# Patient Record
Sex: Male | Born: 1995 | Race: White | Hispanic: No | Marital: Single | Smoking: Current some day smoker
Health system: Southern US, Community
[De-identification: ages and names within clinical notes are randomized; demographics above are authoritative.]

## PROBLEM LIST (undated history)

## (undated) DIAGNOSIS — F0630 Mood disorder due to known physiological condition, unspecified: Secondary | ICD-10-CM

## (undated) DIAGNOSIS — F99 Mental disorder, not otherwise specified: Secondary | ICD-10-CM

## (undated) DIAGNOSIS — Z8659 Personal history of other mental and behavioral disorders: Secondary | ICD-10-CM

## (undated) DIAGNOSIS — F32A Depression, unspecified: Secondary | ICD-10-CM

## (undated) DIAGNOSIS — F509 Eating disorder, unspecified: Secondary | ICD-10-CM

## (undated) DIAGNOSIS — F419 Anxiety disorder, unspecified: Secondary | ICD-10-CM

## (undated) DIAGNOSIS — F1994 Other psychoactive substance use, unspecified with psychoactive substance-induced mood disorder: Secondary | ICD-10-CM

## (undated) DIAGNOSIS — F909 Attention-deficit hyperactivity disorder, unspecified type: Secondary | ICD-10-CM

## (undated) DIAGNOSIS — R51 Headache: Secondary | ICD-10-CM

## (undated) DIAGNOSIS — F319 Bipolar disorder, unspecified: Secondary | ICD-10-CM

## (undated) DIAGNOSIS — F17200 Nicotine dependence, unspecified, uncomplicated: Secondary | ICD-10-CM

## (undated) DIAGNOSIS — Z789 Other specified health status: Secondary | ICD-10-CM

## (undated) DIAGNOSIS — F329 Major depressive disorder, single episode, unspecified: Secondary | ICD-10-CM

## (undated) DIAGNOSIS — F209 Schizophrenia, unspecified: Secondary | ICD-10-CM

## (undated) HISTORY — DX: Headache: R51

## (undated) HISTORY — DX: Mental disorder, not otherwise specified: F99

## (undated) HISTORY — PX: APPENDECTOMY: SHX54

## (undated) NOTE — ED Notes (Signed)
 Formatting of this note might be different from the original. Pt increased anxiety and restless in stretcher. RN to give PRN versed . Will continue to monitor Electronically signed by Rafael Parody, RN at 05/27/2020  6:08 AM EDT

## (undated) NOTE — Unmapped External Note (Signed)
 Formatting of this note might be different from the original. Pt presents with methamphetamine overdose.   BP (!) 138/92   Pulse 137   Temp 37.4 C (99.4 F) (Oral)   Resp 19   Wt 61.2 kg (135 lb)   SpO2 99%   BMI 21.79 kg/m   I have seen and evaluated the patient along with the resident physician. I have obtained an independent history and performed an independent physical exam. I agree with the residents documentation.   Almarie KIDD. Bartholome, MD  Electronically signed by Bartholome Almarie KIDD., MD at 05/27/2020  4:09 AM EDT

## (undated) NOTE — ED Notes (Signed)
 Formatting of this note might be different from the original. Patient is currently anxious and tachycardic at this time. MD contacted to ask about fluids on this patient.  Electronically signed by Cher Mora, RN at 05/27/2020  8:17 AM EDT

## (undated) NOTE — ED Notes (Signed)
 Formatting of this note might be different from the original. Paged team, pt wants to leave AMA.  Electronically signed by Coleridge Domino, RN at 05/27/2020  8:59 PM EDT

## (undated) NOTE — ED Notes (Signed)
 Formatting of this note might be different from the original. 1013 signed by MD Jama Mark at bedside, green armband placed on pt. Pt in hallway spot w/in close vision to nurses station Electronically signed by Rafael Parody, RN at 05/27/2020  5:07 AM EDT

## (undated) NOTE — ED Notes (Signed)
 Formatting of this note might be different from the original. Patient denies pain and is resting comfortably.  Electronically signed by Garnett Smolder, RN at 05/27/2020  8:31 AM EDT

## (undated) NOTE — Discharge Summary (Signed)
 Formatting of this note is different from the original. MEDICAL AMA SUMMARY  NAME: Thomas [name] MRN: [id number] DOB: 1996/07/29 ADMIT Date: 05/27/2020  3:37 AM  AMA Date: 05/27/2020 10:00 PM  Attending of Record: No att. providers found Admitting Physician: Aureliano DEL. Essie, MD Discharge Physician: Barnetta Greener, MD, Aureliano DEL. Essie, MD Outpatient PCP: No primary care provider on file.  Past Medical History:  Diagnosis Date  ? ADHD   ? Bipolar 1 disorder (CMS-HCC)   ? Schizophrenia (CMS-HCC)   ? Schizophrenia (CMS-HCC)    Principal and Secondary Diagnoses: 1. Amphetamine  Intoxication 2. Polysubstance Abuse Disorder 3. Schizophrenia 4. Bilateral Palmar Pustural Lesion .   Diagnostic Tests: UDS - positive for amphetamines Blood Cx: NGTD x 2  Procedures:1. None  Brief Presenting History: Thomas [name] is a 41 y.o. male with a PMH of schizoaffective disorder, bipolar disorder, prior SI attempt with acetaminophen , borderline personality disorder, hep C, and drug abuse who presented to the ED with drug intoxication.   Patient is a very difficult historian. He denied any drug abuse. Said he would like to get out of [place] . He reported eating a sandwich that tasted very bad. Notably patient was vomiting on arrival to the ED.  Per ED signout, patient reportedly stole delson and drank it, also took crystal meth. The ED contacted poison control who recommended fluids, benzos, and monitoring for 12 hours.   Hospital Course by Problem List:  #Amphetamine  Intoxication #Polysubstance Abuse Disorder Patient with tachycardia, aggitation, dilated pupils, and +UDS consistent with amphetamine  intoxication. Patient given IVF and watched throughout the day. Patient's intoxication improved and the patient eventually elected to leave against medical advice as he had yet to complete an infectious work up for his skin lesions. Patient was able to repeat risks and benefits of leaving hospital and  getting treatment, respectively, and expressed clear understanding of risks of forgoing treatment. Patient left AMA after getting blood culture results. Psych consulted and patient was contemplative regarding substance abuse and is interested in going to rehab In the future.   #Schizoaffective Disorder #SI attempted Hx Patient did not express SI/HI. Psych consulted and recommended zyprexa 10 mg every day for psychosis with 2.5 mg q6hr PRN for anxiety. Patient left AMA prior to receiving medications. As above, patient understood risks of leaving, had capacity, and left the hospital despite being told of the risks of doing so.   #Bilateral Palmar Pustular Skin Lesions Patient with bilateral palmar pustual skin lesions on arrival. Unclear etiology and patient left AMA prior to full work up being doing. Was able to get blood cultures prior to patient leaving, which were negative. Patient did not stay to get other infectious work up. As above, left AMA despite being told of risks of forgoing treatment and demonstrating understanding of that risk.   The remainder of the patient's medical problems were chronic and stable without any further intervention this admission. The patient will continue the current treatments and medications.  Discharge Medication Reconcilitation: Discharge Medication List as of 05/27/2020 10:02 PM   CONTINUE these medications which have CHANGED   Details  risperiDONE  (RISPERDAL ) 2 MG/2 ML SOLN Take 1 mL (1 mg total) by mouth 2 times every day.Disp-60 mL, R-0, Normal     Discharge Instructions: The patient was educated on warning signs regarding the current medical conditions. If any of these issues were to arise or worsen, the patient was instructed to contact the PCP or seek further medical evaluation in the emergency room.  Test Pending/Items  to Follow Up: None  Follow-up Appointment Date and Time: No future appointments.  Barnetta Greener, MD [facility] Internal Medicine,  PGY-3 PIC [phone] Cosigned by [name], MD at 06/16/2020  4:09 AM EDT Electronically signed by Greener Barnetta, MD at 06/15/2020  1:45 PM EDT Electronically signed by [name], MD at 06/16/2020  4:09 AM EDT  Associated attestation - Essie Aureliano Barrio, MD - 06/16/2020  4:09 AM EDT Formatting of this note might be different from the original. I am administratively signing this document. AURELIANO HILARIO ESSIE, MD [facility] [phone]

## (undated) NOTE — ED Notes (Signed)
 Formatting of this note might be different from the original. Pt requesting to be discharged at this time. RN called Dr. Damita and per provider, pt is no longer a risk for injury at this time. Pt requesting to leave AMA. Per Dr. Damita, pt able to be discharged at this time. 1013 to be discontinued at this time.  Electronically signed by Fritz Dural, RN at 05/27/2020  9:09 PM EDT Electronically signed by Fritz Dural, RN at 05/27/2020  9:26 PM EDT

## (undated) NOTE — Progress Notes (Signed)
 Formatting of this note is different from the original.  Internal Medicine Progress Note  Service: [facility] Medicine Team [facility] Day: LOS: 0 days  Attending:  Aureliano Leys, MD  Subjective / Interval History  Pt is poor historian.   Pt reports auditory hallucinations - hearing the TV and people outside his room talk about him. Also reports that he can't pee although he voided this morning.   Otherwise no pain, SOB, fever, chills, N/V. No SI/HI  Objective  Physical Exam (5) 24hour vital signs: Temp:  [37.1 C (98.8 F)-37.6 C (99.6 F)] 37.6 C (99.6 F) Pulse:  [100-137] 108 Resp:  [16-20] 18 Blood Pressure: (137-155)/(80-92) 146/80  @LASTSAO2 (1)@ No intake/output data recorded.  GEN: NAD, AAOx3,  HEENT: no icterus, dilated pupils, EOMI Neuro: Alert, follows commands CVS: Tachycardic, no m/r/g Pulm: CTAB, normal WOB on room air Abd: Soft, NT, ND Ext: track marks and excoriation on arms Skin: scattered bilateral palmar 1-2 mm pustules, diffuse excoriations over back  Psych: fidgets, anxious, looking around  Labs reviewed, notable for  BMP:   CBC and Coags:   Recent Labs    05/27/20 0436  NA 137  K 3.4  CL 102  CO2 22  BUN 20  CREATININE 1.0  GLU 71   Recent Labs    05/27/20 0436  WBC 17.9*  HGB 15.1  HCT 42.9  PLT 240   Recent Labs    05/27/20 0436  CALCIUM 9.8  BILITOT 1.7*  BILIDIR 0.5  ALKPHOS 69  AST 50*  ALT 21  PROT 7.9  LABALB 4.8   Recent Labs    05/27/20 0436  PT 13.2*  INR 1.2*   Recent Labs    05/27/20 0436  MCV 87.0    Recent Labs    05/27/20 0436  CKTOTAL 1,968*   No results found for: CHOLTOT, TRIG, HDL, LDLCALC No results found for: TSH, HGBA1C, MICROALBUR urine Lab Results  Component Value Date   COLORU Yellow 01/07/2020   CLARITYU Clear 01/07/2020   SPECGRAV 1.027 01/07/2020   PROTEINU 1+=30 mg/dL (A) 96/75/7978   GLUCOSEU Negative < 50 mg/dL 96/75/7978   KETONESU Trace=5 mg/dL (A) 96/75/7978    BILIRUBINUR Negative < 2.0 mg/dL 96/75/7978   HGBQUALU Negative <0.03 mg/dL 96/75/7978   NITRITE Negative 01/07/2020   LEUKESTRASEU Negative <25 WBC/uL 01/07/2020   LEUKOCYTESUR 11 - 25 / HPF (A) 01/07/2020   RBCU < 3 / HPF 01/07/2020   BACTERIAUA Present (A) 01/07/2020   UDS: Positive Amphetamine , Benzos, Fentanyl ; Negative Barbs, THC, cocaine, opiates; Low EtOH, acetaminophen , and salicylates  Imaging  EKG: Sinus tachycardia  ST-Sinus tachycardia-rate>99  Probable left atrial enlargement  PLAE-Probable left atrial enlargement-P >8mS, <-0.31mV V1  Borderline T abnormalities, anterior leads  T0AN-Borderline T abnormalities, anterior leads-T flat or neg, V2-V4  Medications Scheduled Meds: ? Enoxaprin [place] inj for VTE Prophylaxis (HIGH risk for VTE)  40 mg Subcutaneous Q24H  ? lactated ringers  1,000 mL Intravenous Once  ? OLANZapine ODT zydis  10 mg Sublingual Nightly   PRN Meds:midazolam , [COMPLETED] Insert Peripheral IV **AND** Maintain Peripheral IV **AND** normal saline **AND** sodium chloride , sennosides, polyethylene glycol  Assessment:  Thomas [name] is a 18 y.o. male with a PMH of schizoaffective disorder, bipolar disorder, prior SI attempt with acetaminophen , borderline personality disorder, hep C, and drug abuse who presented to the ED with drug intoxication and vomiting. Pt admitted to drug use of crystal meth. Unlikely this was a suicide attempt.   Problem List  and Plan:  1.  #Amphetamine  and Fentanyl  Intoxication w/Hx of Suicide Attempts, Schizoaffective & Bipolar Disorder & Borderline personality Disorder Above drugs positive on UDS. Per ED signout, they discussed with toxicology who recommended fluids, PRN benzos, and monitoring for 12 hours. S/p 2L IV fluids in ED.  - inpatient psychiatry consult - they rec Zyprexa 10 mg daily and 2.5 mg q6h prn - supportive care - 1000 mL LR - Pt states that he wants to leave AMA because he has things to handle before the end of  the week  2. Leukocytosis (17.9) - likely related to meth use - continue supportive care  3. Elevated CPK (1968) - possibly has some rhabdomyolysis from meth use - continue supportive care  4. Bilateral Palmar Pustular lesions - scabies v syphilis v infx. No lesions in web space - scabies lower on DDx - RPR ordered - blood cultures drawn  General:  - Diet/education: regular  - DVT Prophylaxis: Lovenox  40 mg   - Functional Status/Consults: Psych inpatient  - Lines/Catheters currently in place: PIV  - Code Status: Full  Montgomery Griffiths, MD [facility] Internal Medicine PGY-1 05/27/2020 4:12 PM   Cosigned by [name], MD at 05/27/2020 10:23 PM EDT Electronically signed by Griffiths Montgomery, MD at 05/27/2020  4:46 PM EDT Electronically signed by Griffiths Montgomery, MD at 05/27/2020  7:17 PM EDT Electronically signed by [name], MD at 05/27/2020 10:23 PM EDT  Associated attestation - Essie Aureliano Barrio, MD - 05/27/2020 10:23 PM EDT Formatting of this note might be different from the original. I am administratively signing this document. AURELIANO HILARIO ESSIE, MD [place] [phone]

## (undated) NOTE — ED Notes (Signed)
 Formatting of this note might be different from the original. Patient got out of his bed with sitter at the bedside and ran out into the waiting room and tried to escape. Patient was able to be stopped and security was called. Patient was taken to [facility] and report was given to the nurse on the patient and 10-13 was given as well. Patient was already dressed out and belongings were in a bag. Patient was given a debriefing after the situation on why he is not able to leave and that he is in safe place. Security escorted the patient back to PES with nurse.  Electronically signed by Cher Mora, RN at 05/27/2020  9:24 AM EDT

## (undated) NOTE — H&P (Signed)
 Formatting of this note is different from the original. Internal Medicine History and Physical  Service: [facility] Medicine Team H   Attending: Aureliano DEL. Essie, MD  Name: Thomas [name] MRN:   [id number]  CC: drug overdose  HPI Thomas [name] is a 25 y.o. male with a PMH of schizoaffective disorder, bipolar disorder, prior SI attempt with acetaminophen , borderline personality disorder, hep C, and drug abuse who presented to the ED with drug intoxication.   Patient is a very difficult historian. He denies any drug abuse to me. Says he would like to get out of [place] . He reports eating a sandwich that tasted very bad. Notably patient was vomiting on arrival to the ED.  Per ED signout, patient reportedly stole delson and drank it, also took crystal meth. The ED contacted poison control who recommended fluids, benzos, and monitoring for 12 hours. Unclear if yesterday's drug abuse was a suicide attempt, but psychiatry was not consulted in the ED.  Review of Systems Limited ability to assess based on patient's affect/physical state but denies chest pain, fevers, SOB, or pain anywhere else.  ED Course: Received 2L LR and 6mg  versed .  PMH Past Medical History:  Diagnosis Date  ? ADHD   ? Bipolar 1 disorder (CMS-HCC)   ? Schizophrenia (CMS-HCC)   ? Schizophrenia (CMS-HCC)    PSxH No past surgical history on file.  Home medications No current facility-administered medications on file prior to encounter.   Current Outpatient Medications on File Prior to Encounter  Medication Sig Dispense Refill  ? risperiDONE  (RISPERDAL ) 2 MG/2 ML SOLN Take 1 mL (1 mg total) by mouth 2 times every day. 60 mL 0   Allergies Allergies  Allergen Reactions  ? Olanzapine Nausea Only and Other (See Comments)    Makes me red    FmHx Unable to assess  SoHx Unable to assess  PCP: No primary care provider on file.  Physical Exam BP (!) 155/80   Pulse 122   Temp 37.1 C (98.8 F) (Oral)   Resp 16    Wt 61.2 kg (135 lb)   SpO2 99%   BMI 21.79 kg/m  Weight: 61.2 kg (135 lb)  GEN:  Sitting in no acute distress, follows directions but not well HEENT: No icterus, dilated pupils that are minimally responsive to light, diffuse facial erythema NEURO: alert CV:  Tachycardic, no m/r/g PULM:   CTAB, normal work of breathing on room air ABD:  Nondistended, erythematous navel EXTREM: atraumatic SKIN:  Scattered <1 - 2mm pustules on palms of bilateral hands, linear excoriation over right flank, diffuse excoriations over entire back PSYCH: Fidgety, looking around  Labs/Studies  CMP:  Lab Results  Component Value Date   NA 137 05/27/2020   K 3.4 05/27/2020   CL 102 05/27/2020   CO2 22 05/27/2020   BUN 20 05/27/2020   CREATININE 1.0 05/27/2020   CREATININE 0.8 03/19/2020   CREATININE 0.9 03/19/2020   GLU 71 05/27/2020   PROT 7.9 05/27/2020   LABALB 4.8 05/27/2020   BILITOT 1.7 (H) 05/27/2020   BILIDIR 0.5 05/27/2020   ALT 21 05/27/2020   AST 50 (H) 05/27/2020   ALKPHOS 69 05/27/2020   CBC and Coags:  Lab Results  Component Value Date   WBC 17.9 (H) 05/27/2020   HGB 15.1 05/27/2020   HCT 42.9 05/27/2020   MCV 87.0 05/27/2020   PLT 240 05/27/2020   PT 13.2 (H) 05/27/2020   INR 1.2 (H) 05/27/2020   Lipids: No results found for:  CHOLTOT, TRIG, HDL, LDLCALC Cardiac Enzymes:  Lab Results  Component Value Date   TROPONINI <0.03 03/05/2019   TROPONINI <0.03 02/17/2019   CKTOTAL 1,968 (H) 05/27/2020   CKTOTAL 43 (L) 03/18/2020   Other Labs: No results found for: TSH, HGBA1C, MICROALBUR  Urine: Lab Results  Component Value Date   COLORU Yellow 01/07/2020   CLARITYU Clear 01/07/2020   SPECGRAV 1.027 01/07/2020   PROTEINU 1+=30 mg/dL (A) 96/75/7978   GLUCOSEU Negative < 50 mg/dL 96/75/7978   KETONESU Trace=5 mg/dL (A) 96/75/7978   BILIRUBINUR Negative < 2.0 mg/dL 96/75/7978   HGBQUALU Negative <0.03 mg/dL 96/75/7978   NITRITE Negative 01/07/2020   LEUKESTRASEU  Negative <25 WBC/uL 01/07/2020   LEUKOCYTESUR 11 - 25 / HPF (A) 01/07/2020   RBCU < 3 / HPF 01/07/2020   BACTERIAUA Present (A) 01/07/2020   UDS: positive for amphetamine , fentanyl , and benzos  EKG: LAE, q in aVL, inverted T wave in v1  Assessment/Plan: Thomas [name] is a 56 y.o. male with a PMH of schizoaffective disorder, bipolar disorder, prior SI attempt with acetaminophen , borderline personality disorder, hep C, and drug abuse who presented to the ED with drug intoxication. Unclear if this was also a suicide attempt.   #Amphetamine  and Fentanyl  Intoxication w/Hx of Suicide Attempts, Schizoaffective & Bipolar Disorder & Borderline personality Disorder Above drugs positive on UDS. Per ED signout, they discussed with toxicology who recommended fluids, PRN benzos, and monitoring for 12 hours. S/p 2L IV fluids in ED.  - inpatient consult to psychiatry - supportive care - patient interested in getting a bus ticket to [place]   General - Diet: regular - DVT Prophylaxis: lovenox  - Line/Foley Status: - Contact: Primary Emergency Contact: Marzouk, Aura - Code Status: - Dispo:   DISCHARGE PLANNING: Meds: TBD O2: TBD Home: TBD ABX: TBD Walk: TBD Kar: TBD Sched:   No future appointments.  Lucila Hem, M.D. PGY3, [facility] Internal Medicine [facility] [phone] 05/27/2020  The 8PM until 7AM cross cover resident for this patient is PIC ([id number] if Teams A-H, [id number] if Teams I-L or SIS).  Cosigned by [name], MD at 05/27/2020 10:25 PM EDT Electronically signed by Hem Lucila, MD at 05/27/2020  8:02 AM EDT Electronically signed by [name], MD at 05/27/2020 10:25 PM EDT  Associated attestation - Essie Aureliano Barrio, MD - 05/27/2020 10:25 PM EDT Formatting of this note is different from the original. I saw and evaluated the patient.  Discussed with resident and agree with resident?s findings and plan as documented in the resident?s note. The patient presents with evidence of  sympathomimetic intoxication. His UDS was positive for amphetamines, fentanyl  and cannabinoids. He expresses no SI or HI and does not require psychiatric hold. On exam he is unkempt and has some small pustules on his hands and a fine erythematous rash on his feet.   The etiology of the rash is unclear but it appears most c/w folliculitis. Given the patients leukocytosis and h/o IVDA (states he hasn't shot drugs in months) will draw blood cxs.   The patient wishes to leave AMA. He understands that we would like to keep him overnight for further observation and understands the risk of his leaving. He will allows us  to draw blood cxs and we have a way to contact him should they return positive. He expresses that he will return to the hospital if necessary. Will attempt to make sure patient has scripts for his psychiatric meds. Patient to sign AMA form.  AURELIANO HILARIO ESSIE, MD,  [facility] Professor of Medicine pic [phone]

## (undated) NOTE — ED Notes (Signed)
 Formatting of this note might be different from the original. 1L of fluid started on this patient for HR of 128  Electronically signed by Cher Mora, RN at 05/27/2020  8:47 AM EDT

## (undated) NOTE — ED Notes (Signed)
 Formatting of this note might be different from the original. Dustin MD resident came and spoke with pt and verbalized it being ok for him leaving AMA. Pt ambulatory w/ steady gait. Pt verbalized understanding of being discharged. Pt refused discharge vitals. Pt walked out of ECC w/ RN and security. Electronically signed by Coleridge Domino, RN at 05/27/2020  9:59 PM EDT Electronically signed by Coleridge Domino, RN at 05/27/2020 10:01 PM EDT

## (undated) NOTE — ED Notes (Signed)
 Formatting of this note might be different from the original. Spoke w/ Dustin MD with [employer] for [employer]. Celestine stated he would discuss w/ his senior resident about AMA status and be down to talk to the pt if this was an option. Dustin also stated that they do not sign AMA forms, they would be putting the conversation with the pt in his chart for record.  Electronically signed by Coleridge Domino, RN at 05/27/2020  9:07 PM EDT

## (undated) NOTE — Consults (Signed)
 Associated Order(s): [facility] IP CONSULT TO PSYCHIATRY Formatting of this note is different from the original. PSYCHIATRIC CONSULTATION  Patient ID: [id number]  Name: Thomas [name] Admission Date: 05/27/2020  3:37 AM  Sex: male  MRN: [id number] Attending Provider: [name], MD  Room/Bed: PES62/PES62 DOB: 01-Jun-1996 Age: 29 y.o.    Reason for Consult: SI  Referring Physician Contact Name and Number: Thomas Ice MD  ASSESSMENT:   Patient is a 45 y/o Caucasian male with PMH of Hepatitis C and past psychiatric hx of SIMD vs schizoaffective disorder, polysubstance use and borderline personality disorder who presented to the ED with drug intoxication. U tox was positive for amphetamines, benzos and fentanyl . Psychiatry was consulted for SI assessment. On assessment patient is AAOx3. Reports long hx of polysubstance use, that started at the age of 79. Reports using Dextromethorphan and [name] yesterday, uses those all day, everyday. Reports AH/VH, and delusions. Currently denies acute mood, physical or cognitive issues. Denies feeling suicidal, denies intent or plan. Denies HI, intent or plan. On assessment appears restless, paranoid and hyperverbal. Those symptoms are likely in the context of chronic polysubstance use, although an underlying psychotic disorder cannot be excluded. He is at chronic suicide risk given past history of suicide attempt. Reports his mother and grandmother as protective factors. Currently contemplative regarding his substance use and interested in going to rehab once medically cleared.   Psychiatric/Substance: SIMD Polysubstance use (crystal meth, cocaine, benzodiazepines, ectasy, opiates, dextromethorphan,) Schizoaffective disorder  Schizophrenia by hx Borderline Personality Disorder  Medical: Amphetamine  and Fentanyl  intoxication  Psychosocial: Inadequate housing Low income Problems related to primary support group Academic or educational problem Problem  related to employment Problem related to legal circumstances Nonadherence to medical and/or mental health treatment   RECOMMENDATIONS / PLAN:  - Zyprexa Zydis 10 mg for psychosis and paranoia - Zyprexa 2.5 mg Q 6 hours prn for paranoia, psychosis and agitation ( Do not exceed Zyprexa 20 mg in the 24 hour period) - Will try to get collateral from mom - We will continue to follow and re-evaluate tomorrow for final dispo  Please page Psychiatry Consult Team at [facility] [phone] if you have further questions on business days between 8:00am and 4:30pm In case of a psychiatric emergency arising after hours, on a weekend or holiday, please page Psychiatry Emergency Service at [facility] [phone].  HISTORY OF PRESENT PSYCHIATRIC ILLNESS: For a more detailed HPI please refer to medical student's Thomas [name]'s note  Thomas [name] is a 43 y.o. male with a PMH of schizoaffective disorder, bipolar disorder, prior SI attempt with acetaminophen , borderline personality disorder, hep C, and drug abuse who presented to the ED with drug intoxication.  Psychiatry was consulted for Suicide risk assessment. Patient reports long history of polysubstance use that started with cannabis at the age of 75. Reports using IV heroin, methamphetamines, amphetamines, Ectasy, PO opiates, and dextromethorphan. Denies Etoh use. Reports he uses Dextromethorphan and [name] everyday. Report he has been in rehab twice in his life - last time in 05-27-17 Reports father had a hx of polysubstance use and died due to complications in May 28, 2019. Patient was not close with him. 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 Denies hx of seizures, LOC, TBI Reports he ate food prior to coming to the hospital and was vomiting : due to food smelling  like death Reports seeing demons and believes people are out to get him and TV is talking  to him. Denies SI/HI, denies intent or plan  Per EMR: 10  ER presentations, mostly in the context of substance use since January 2021 Per care everywhere patient has been utilizing multiple healthcare systems in [place] , [place] and     PSYCHIATRIC ROS Depression: Yes Depressed mood Mania: Yes Pressured speech Psychosis: Yes AH/VH,  Anxiety/ OCD/ PTSD: No Suicidality: Yes intermittent SI with no intent or plan Other Self-Injurious Behavior: No Violent/ Aggressive Behavior: No   PAST PSYCHIATRIC HISTORY Past Diagnoses: Schizophrenia, polysubstance use Past Hospitalizations: Reports several past psychiatric hospitalizations Psychiatrist: none Therapist: none Prior Psychiatric Drug Trials: Risperidone  2 mg, Busbar 5 mg, Wellbutrin  XL 150 mg , Invega  Sustenna, Seroquel  400 mg QHS Suicide Attempt: reports SA via overdose  Self-injurious behavior: none  FAMILY HISTORY:  Father with hx of polysubstance use, died 2/2 complications of substance use in 2020   Completed suicide in relatives: Unknown   SOCIAL HISTORY: In [place] for the past 6 months Originally from [place]  Currently unemployed and homeless, reports he last worked prior to his father passing away Mother, Thomas [name] and siblings live in [place]  Finished 10th grade  Social History   Socioeconomic History  ? Marital status: Single    Spouse name: Not on file  ? Number of children: Not on file  ? Years of education: Not on file  ? Highest education level: Not on file  Occupational History  ? Not on file  Tobacco Use  ? Smoking status: Current Every Day Smoker    Packs/day: 0.25  ? Smokeless tobacco: Never Used  Substance and Sexual Activity  ? Alcohol use: Yes  ? Drug use: Yes    Types: Marijuana, Methamphetamines, Cocaine    Comment: cough syrup  ? Sexual activity: Not Currently  Other Topics Concern  ? Not on file  Social History Narrative   ** Merged History Encounter **    Social Determinants of Health   Financial Resource Strain:   ?  Difficulty of Paying Living Expenses:   Food Insecurity:   ? Worried About Programme researcher, broadcasting/film/video in the Last Year:   ? Barista in the Last Year:   Transportation Needs:   ? Freight forwarder (Medical):   ? Lack of Transportation (Non-Medical):   Intimate Partner Violence:   ? Fear of Current or Ex-Partner:   ? Emotionally Abused:   ? Physically Abused:   ? Sexually Abused:     ALLERGIES:  Allergies  Allergen Reactions  ? Olanzapine Nausea Only and Other (See Comments)    Makes me red    PAST MEDICAL HISTORY:  Past Medical History:  Diagnosis Date  ? ADHD   ? Bipolar 1 disorder (CMS-HCC)   ? Schizophrenia (CMS-HCC)   ? Schizophrenia (CMS-HCC)     REVIEW OF SYSTEMS: Reviewed GEN: Sitting in no acute distress, follows directions but not well HEENT: No icterus, dilated pupils that are minimally responsive to light, diffuse facial erythema NEURO: Alert RC:Ujrybrjmipr, no m/r/g PULM:CTAB, normal work of breathing on room air ABD: Nondistended, erythematous navel EXTREM: Atraumatic SKIN: Scattered <1 - 2mm pustules on palms of bilateral hands, linear excoriation over right flank, diffuse excoriations over entire back PSYCH: Restless, paranoid  ROS completed on 05/27/20 reviewed and updated.  Pertinent new positives and negatives included in current history of present illness  MEDICATIONS PRIOR TO ADMISSION: (Not in a hospital admission)   CURRENT MEDICATIONS: Current Facility-Administered Medications  Medication Dose Route Frequency Provider Last Rate Last Admin  ?  midazolam  (VERSED ) 2 MG/2ML injection 2 mg  2 mg Intravenous PRN Sadjadi, Raha, MD   2 mg at 05/27/20 0605  ? normal saline 0.9 % flush 3 mL  3 mL Intravenous PRN Sadjadi, Raha, MD       And  ? sodium chloride  0.9 % IV fluid 500 mL  500 mL Intravenous PRN Sadjadi, Raha, MD      ? sennosides (SENNA-GEN) 8.6 MG tablet 2 tablet  2 tablet Oral Nightly PRN Sadjadi, Raha, MD      ? polyethylene glycol  (MIRALAX) packet 17 g  17 g Oral Daily PRN Sadjadi, Raha, MD      ? enoxaparin  (LOVENOX ) 40 mg/0.4 mL injection 40 mg  40 mg Subcutaneous Q24H Sadjadi, Raha, MD       Current Outpatient Medications  Medication Sig Dispense Refill  ? risperiDONE  (RISPERDAL ) 2 MG/2 ML SOLN Take 1 mL (1 mg total) by mouth 2 times every day. 60 mL 0    LABORATORY STUDIES:  U tox positive for Benzodiazepines, Amphetamines and Fentanyl  CPK 1,968 WBC 17.9 AST:50  Reviewed    VITAL SIGNS:   BP 137/85   Pulse 100   Temp 37.6 C (99.6 F) (Oral)   Resp 20   Wt 61.2 kg (135 lb)   SpO2 99%   BMI 21.79 kg/m      MENTAL STATUS EXAMINATION CONSCIOUSNESS: Oriented x 4 APPEARANCE: Disheveled, multiple abrasions on upper extremities and back, pustules on palms bilaterally BEHAVIOR: Cooperative MOTOR ACTIVITY: Restless, psychomotor agitated SPEECH: Pressured and Hyperverbal MOOD: Patient states mood is IPeople are talking about me, I don't feel safe here AFFECT:  Tense ASSOCIATIONS:  logical THOUGHT PROCESS: Goal directed, Tangential and disorganized at times THOUGHT CONTENT: Delusions and Paranoia  SUICIDAL IDEATION:  Yes, without intention (See Suicide Risk Assessment) HOMICIDAL IDEATION:  Denies INSIGHT: Adequate JUDGMENT: Fair COGNITIVE FUNCTION: Grossly Intact   ADDITIONAL COMMENTS:   Patient seen & staffed with Dr.Schwartz Electronically signed by: Gwenda CANDIE Lone, MD   Cosigned by Arlana Jenkins BROCKS., MD at 05/28/2020  5:56 PM EDT Electronically signed by Lone Gwenda, MD at 05/27/2020 12:44 PM EDT Electronically signed by Lone Gwenda, MD at 05/27/2020  1:32 PM EDT Electronically signed by Lone Gwenda, MD at 05/27/2020  1:42 PM EDT Electronically signed by Lone Gwenda, MD at 05/27/2020  2:27 PM EDT Electronically signed by Arlana Jenkins BROCKS., MD at 05/28/2020  5:56 PM EDT  Associated attestation - Arlana Jenkins Craven, MD - 05/28/2020  5:56 PM EDT Formatting of  this note might be different from the original. Attending Note:  I have reviewed the history and have personally evaluated the patient. I have contributed to the evaluation and planning regarding this patient, and am in agreement with the documentation.   Jenkins Arlana, MD
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
## (undated) NOTE — ED Notes (Signed)
 Formatting of this note might be different from the original. Patient in room, requests to leave. Appears agitated, has dilated pupils.  Electronically signed by Gwyneth Blunt, RN at 05/27/2020  5:12 PM EDT

## (undated) NOTE — Unmapped External Note (Signed)
 Formatting of this note might be different from the original. Called by RN that pt was again requesting to leave AMA but there was a 1013 in the chart. I spoke with senior resident of the primary team, Dr. Horace who confirmed that patient had been cleared by the attending during the day and posed no further risk of intentional self-harm.   Pt seen at bedside, found initially to be somnolent but eventually awoke with brisk stimulation.   Thomas [name] is oriented to person, place, year, and president. During our conversation he is able to hold sustained attention to me. He denies SI, HI, or plans for these things when asked. I have no intention of hurting myself ever in 1,000 million years.   The patient has proved to this physician that they have capacity to make their own medical decisions. The patient understands the the risk of leaving AMA may include worsening health, up to and including death. The patient voiced understanding and had no further questions.   Thomas CORDOBA Juline, MD MS Resident Physician PGY-3 [facility] Medicine [facility] [phone]   Electronically signed by [name] Thomas Gearing, MD at 05/27/2020  9:31 PM EDT

## (undated) NOTE — Procedures (Signed)
 Associated Order(s): SUICIDE RISK ASSESSMENT Post-Procedure Diagnose(s): Substance induced mood disorder (CMS-HCC) Formatting of this note might be different from the original. [place] Suicide Risk Assessment and other Violent Risk Assessment  Type of Assessment: Initial Assessment Information was gathered from: Patient  Suicidal Thoughts in the Last Month and Lifetime Passive thoughts: Have you wished you were dead?: Last month: Yes  Lifetime: Yes Active Suicidal ideation:   Ideation: Have you had thoughts of killing yourself?: Last month: Yes Lifetime: Yes   Method: Have you been thinking about how you would kill yourself?: Last month: NoLifetime: Yes   Intent: Have you had these thoughts AND intention of acting on them?: Last month: NoLifetime: Yes   Plan:  Have you started to work on the details of how to kill yourself?: Last month: NoLifetime: Yes      Frequency of thoughts in the last week: Less than once a week  Intensity of Ideation  Recent - Most severe Ideation: 1 Description of ideation: Lifetime - Most severe Ideation: 1 Description of ideation:  Frequency How many times have you had these thoughts?  Last Month: Less than once a week   Lifetime: Less than once a week Duration When you have the thoughts how long do they last? Last Month: Fleeting - few seconds or minutes Lifetime: Fleeting - few seconds or minutes Controllability  Could/can you stop thinking about killing yourself or wanting to die if you want to? Last Month: Can control thoughts with little difficulty Lifetime: Can control thoughts with little difficulty Deterrents Are there things - anyone or anything - that stopped you from wanting to die or acting on thoughts of committing suicide? Last Month: Deterrents definitely stopped you from attempting suicide Lifetime: Deterrents definitely stopped you from attempting suicide Reasons for Ideation What sort of reasons did you have for thinking about wanting  to die or killing yourself? Last Month: Does not apply Lifetime: Does not apply  Suicidal Behavior Actual Attempt: Past 3 month: No Total# of Attempts: Lifetime: Yes Total# of Attempts: 1  Has subject engaged in Non-Suicidal Self-Injurious Behavior?  Past 3 month: Yes Lifetime: Yes Interrupted Attempt: Past 3 month: No Total# of Interrupted: Lifetime: Yes Total# of Interrupted:   Aborted or Self-Interrupted Attempt: Past 3 month:No Total# of Aborted: Lifetime:No Total# of Aborted:   Preparatory Acts or Behavior: Past 3 month: No Total# of Preparations: Lifetime: Yes Total# of Preparations:  Actual Lethality/Medical Damage: Most Recent Attempt Date: 0. No/very minor physical damage Most Lethal Attempt Date: 0. No/very minor physical damage Initial/First Attempt Date: 0. No/Very minor physical damage  Potential Lethality: Only Answer if Actual Lethality=0 Most Recent Attempt Date: 0 = Behavior not likely to result in injury Most Lethal Attempt Date: 0 = Behavior not likely to result in injury Initial/First Attempt Date: 0 = Behavior not likely to result in injury  Violent or aggressive Thoughts in the Last Month Thoughts of harming others: No  Significant Past Violent/Aggressive Behavior Prior Violent/Aggressive Behavior: No  Risk factors  Do you have access to firearms? No General Factors that Increase Risk:  Active Alcohol/Substance Use Disorder Age 26 - 87 Additional Suicide Specific Risk Factors:  Mood Disorder with Mixed Features White Race or Native American Additional Violence Specific Risk factors:  Unstable work history Factors that Decrease risk:  Connection to family, religion or community Future oriented/Goal directed Able to identify reasons for living/not harming others  Overall Imminent Self/Other Risk Determination Clinical Decision Making Regarding Self/Other Violence Risk:  Patient provided  with [facility]AL number for access after hours or at  discharge. Estimation of Imminent Risk of suicide:  Plan: Refer to a higher level of care,   Cosigned by Arlana Jenkins BROCKS., MD at 05/28/2020  6:00 PM EDT Electronically signed by Serene Finner, MD at 05/27/2020  1:10 PM EDT Electronically signed by Serene Finner, MD at 05/27/2020  1:34 PM EDT Electronically signed by Serene Finner, MD at 05/27/2020  2:27 PM EDT Electronically signed by Arlana Jenkins BROCKS., MD at 05/28/2020  6:00 PM EDT  Associated attestation - Arlana Jenkins Craven, MD - 05/28/2020  6:00 PM EDT Formatting of this note might be different from the original. Attending Note:  Discussed with the fellow and agree with the documentation in their note.  Jenkins Arlana, MD

## (undated) NOTE — ED Notes (Signed)
 Formatting of this note might be different from the original. Initial contact with patient. AIDET performed. Pt presents to endorsing crystal meth use and cough syrup. Pt states he bought meth from someone he usually doesn't. Pt placed on cardiac monitor. Hr in the 130s, IVF initiated. Patient is ambulatory, alert & orientedx2, Bilateral pupils dilated. Pt denies SI and HI   Patient will continue to be monitored.   Electronically signed by Rafael Parody, RN at 05/27/2020  5:03 AM EDT

## (undated) NOTE — ED Notes (Signed)
 Formatting of this note might be different from the original. Report received from Angelica RN, patient walking back from bathroom with sitter, appears anxious but is cooperative and agreeable.  Electronically signed by Garnett Smolder, RN at 05/27/2020  8:34 AM EDT

## (undated) NOTE — Unmapped External Note (Signed)
 Formatting of this note is different from the original. DISCLAIMER:-?THIS STUDENT NOTE WAS CREATED FOR EDUCATIONAL PURPOSES ONLY. IT HAS NOT BEEN REVIEWED FOR ACCURACY OR COMPLETENESS AND IT SHOULD NOT BE USED FOR PATIENT CARE.   PSYCHIATRIC CONSULTATION  Name: Thomas [name] Admission Date: 05/27/2020  3:37 AM  Sex: male  MRN: [id number] Attending Provider: [name], MD  Room/Bed: PES62/PES62 DOB: 1996-05-24 Age: 56 y.o.    Reason for Consult: Concern for possible suicide attempt Referring Physician Contact Name and Number:  ASSESSMENT:   Psychiatric/Substance: 74 y/o M w/ 10th grade education who has a pmhx of hepatitis C, psychiatric hx schizoaffective disorder, bipolar disorder, prior SI attempt with acetaminophen , borderline personality disorder,  polysubstance abuse who presented to the ED with drug intoxication. UDS positive for Methamphetamines, Fentanyl , and Benzodiazepines. Pt reports substance abuse since age 15, with abuse of dextromethorphan and ICE yesterday. Psychiatry was consulted for concern for possible suicide attempt. Pt's substance abuse began around age 83. Auditory and visual hallucinations appear to coincide with escalation of substance abuse during his teenage years. He noted improvement during the 5 months of his incarceration.  Today he is hyper-verbal, exhibits psychomotor agitation, and paranoid delusions. Giving timing of onset and alleviation of psychotic sxs during presumed period of sobriety during encarceration, presentation is likely consistent with substance induced psychosis, though mood disorder with psychotic sxs vs schizoaffective disorder remain on the differential.     RECOMMENDATIONS / PLAN:  - Zyprexa Zydis 10 mg for psychosis and paranoia - Zyprexa 2.5 mg Q 6 hours prn for paranoia, psychosis and agitation ( Do not exceed Zyprexa 20 mg in the 24 hour period) - Will call mom forcollateral  - We will continue to follow and re-evaluate tomorrow  for final dispo  Please page Psychiatry Consult Team at [facility] [phone] if you have further questions on business days between 8:00am and 4:30pm In case of a psychiatric emergency arising after hours, on a weekend or holiday, please page Psychiatry Emergency Service at [facility] [phone].    HISTORY OF PRESENT PSYCHIATRIC ILLNESS:   Thomas [name] is a 89 y.o. male with a PMH of schizoaffective disorder, bipolar disorder, prior SI attempt with acetaminophen , borderline personality disorder, hep C, and polysubstance abuse who presented to the ED with drug intoxication. Urine Drug Screen was positive for amphetamines, benzodiazepines, and fentanyl . Psychiatry was consulted for concern of possible overdose. Pt tried to run away from the ED but was found and brought to PES.  On interview with Thomas [name] was alert and oriented x4. He avoids answering why he came to the hospital or how. He states that last night he met with the devil who gave him a strange meal. The meal smelled like death and caused him to projectile vomit, and he is now concerned for the safety of his mother. He states at times he has difficulty differentiating between things substances make him believe and real life. He says he hears people talking badly about him and hears things in the television. Previous medications include Invega  Sustenna, Welbutrin, and Seroquel . He reports he was most recently rx Buspar  but has not taken it since receiving care at an outpatient center in [place]. Notably, he reported that AV/VH had decreased for 5 months while in jail.  Thomas [name] also evaded answering whether he attempted to commit suicide before arriving to the hospital. He does report two prior attempts to take his own life. Pt is unable to recall the exact year, but he says a  few years ago he attempted to overdose on Heroin at his mother's house and was subsequently kicked out. Per chart review, he has also attempted suicide by overdosing on  acetaminophen . Pt has wanted to die for a long time but denies current active SI/HI and has no plan. He is unable to come up with protective factors.   Pt endorses substance abuse history since age 44. He began smoking weed but finds that it makes him fixate on his homelessness.  He began using molly around age 65 and began experiencing visual hallucinations. He currently uses IV heroin, Meth amphetamines, ecstasy, opiates, and dextromethorphan. On chart review it appears he has had at least 6 hospitalizations due to substance intoxication. He has attempted cessation twice with rehab, most recently in 05/28/17. He currently in the contemplative stage. Of note, his father died in 2019/05/29 due to complications of substance abuse, though he and the patient were not close.     PSYCHIATRIC ROS Depression: Yes Mania: Yes Psychosis:Yes Anxiety/ OCD/ PTSD: No Suicidality: Passive Other Self-Injurious Behavior: No Violent/ Aggressive Behavior: No    PAST PSYCHIATRIC HISTORY Past Diagnoses: Schizoaffective Disorder, Bipolar Disorder, Borderline Personality Disorder Past Hospitalizations: [facility] Psychiatrist: None Therapist: None Prior Psychiatric Drug Trials: Seroquel , Invega  Sustenna, Welbutrin, Buspar  Suicide Attempt:2 prior attempts, acetaminophen / heroin Self-injurious behavior: None   FAMILY HISTORY: No family history on file.    Completed suicide in relatives: None    SOCIAL HISTORY: Social History   Socioeconomic History  ? Marital status: Single    Spouse name: Not on file  ? Number of children: Not on file  ? Years of education: Not on file  ? Highest education level: Not on file  Occupational History  ? Not on file  Tobacco Use  ? Smoking status: Current Every Day Smoker    Packs/day: 0.25  ? Smokeless tobacco: Never Used  Substance and Sexual Activity  ? Alcohol use: Yes  ? Drug use: Yes    Types: Marijuana, Methamphetamines, Cocaine    Comment: cough syrup  ?  Sexual activity: Not Currently  Other Topics Concern  ? Not on file  Social History Narrative   ** Merged History Encounter **    Social Determinants of Health   Financial Resource Strain:   ? Difficulty of Paying Living Expenses:   Food Insecurity:   ? Worried About Programme researcher, broadcasting/film/video in the Last Year:   ? Barista in the Last Year:   Transportation Needs:   ? Freight forwarder (Medical):   ? Lack of Transportation (Non-Medical):   Intimate Partner Violence:   ? Fear of Current or Ex-Partner:   ? Emotionally Abused:   ? Physically Abused:   ? Sexually Abused:      ALLERGIES:  Allergies  Allergen Reactions  ? Olanzapine Nausea Only and Other (See Comments)    Makes me red     PAST MEDICAL HISTORY:  Past Medical History:  Diagnosis Date  ? ADHD   ? Bipolar 1 disorder (CMS-HCC)   ? Schizophrenia (CMS-HCC)   ? Schizophrenia (CMS-HCC)      REVIEW OF SYSTEMS:   ROS completed on 05/27/2020 reviewed and updated.  Pertinent new positives and negatives included in current history of present illness.    MEDICATIONS PRIOR TO ADMISSION:  (Not in a hospital admission)    CURRENT MEDICATIONS:  Current Facility-Administered Medications  Medication Dose Route Frequency Provider Last Rate Last Admin  ? midazolam  (VERSED ) 2 MG/2ML injection 2 mg  2 mg Intravenous PRN Sadjadi, Raha, MD   2 mg at 05/27/20 9394  ? normal saline 0.9 % flush 3 mL  3 mL Intravenous PRN Sadjadi, Raha, MD       And  ? sodium chloride  0.9 % IV fluid 500 mL  500 mL Intravenous PRN Sadjadi, Raha, MD      ? sennosides (SENNA-GEN) 8.6 MG tablet 2 tablet  2 tablet Oral Nightly PRN Sadjadi, Raha, MD      ? polyethylene glycol (MIRALAX) packet 17 g  17 g Oral Daily PRN Sadjadi, Raha, MD      ? enoxaparin  (LOVENOX ) 40 mg/0.4 mL injection 40 mg  40 mg Subcutaneous Q24H Sadjadi, Raha, MD       Current Outpatient Medications  Medication Sig Dispense Refill  ? risperiDONE  (RISPERDAL ) 2  MG/2 ML SOLN Take 1 mL (1 mg total) by mouth 2 times every day. 60 mL 0     LABORATORY STUDIES: REVIEWED    VITAL SIGNS:   BP 137/85   Pulse 100   Temp 37.6 C (99.6 F) (Oral)   Resp 20   Wt 61.2 kg (135 lb)   SpO2 99%   BMI 21.79 kg/m      MENTAL STATUS EXAMINATION CONSCIOUSNESS: AxO x3 APPEARANCE: Young male, appearance c/w reported age, disheveled hair and excoriations all over arms and back. BEHAVIOR: Cooperative w/ interview. Poor eye contact. MOTOR ACTIVITY: Psychomotor agitation, rubbing hands, arms, and legs. SPEECH: Hyper-verbal with pressured speech. Interruptable.  MOOD: Patient states mood is Angry AFFECT:  Reactive. Mood congruent.  ASSOCIATIONS: Logical THOUGHT PROCESS: Tangential with disorganization. THOUGHT CONTENT: Paranoid delusions of people talking badly and the television talking. Perseverates on concern for mother's safety and death of father. SUICIDAL IDEATION:  Passive SI HOMICIDAL IDEATION:  No INSIGHT: Fair.  JUDGMENT:Poor COGNITIVE FUNCTION: Intact    ADDITIONAL COMMENTS:    Patient seen & staffed with Gwenda Lone, MD and Jenkins Casey, MD.  The recommendations were discussed with  (requesting team); contact number  Electronically signed by: Chick Gainer, Medical Student   Electronically signed by Gainer Chick, MD at 05/27/2020  3:10 PM EDT

---
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
## 2017-01-21 ENCOUNTER — Encounter (HOSPITAL_COMMUNITY): Payer: Self-pay | Admitting: *Deleted

## 2017-01-21 ENCOUNTER — Emergency Department (HOSPITAL_COMMUNITY)
Admission: EM | Admit: 2017-01-21 | Discharge: 2017-01-22 | Disposition: A | Discharge status: Other Acute Inpatient Hospital | Payer: Medicaid Other | Attending: Emergency Medicine | Admitting: Emergency Medicine

## 2017-01-21 DIAGNOSIS — F310 Bipolar disorder, current episode hypomanic: Secondary | ICD-10-CM | POA: Diagnosis present

## 2017-01-21 DIAGNOSIS — R45851 Suicidal ideations: Secondary | ICD-10-CM | POA: Diagnosis present

## 2017-01-21 DIAGNOSIS — F17210 Nicotine dependence, cigarettes, uncomplicated: Secondary | ICD-10-CM | POA: Diagnosis not present

## 2017-01-21 DIAGNOSIS — Z79899 Other long term (current) drug therapy: Secondary | ICD-10-CM | POA: Insufficient documentation

## 2017-01-21 DIAGNOSIS — F909 Attention-deficit hyperactivity disorder, unspecified type: Secondary | ICD-10-CM | POA: Diagnosis not present

## 2017-01-21 DIAGNOSIS — F1910 Other psychoactive substance abuse, uncomplicated: Secondary | ICD-10-CM

## 2017-01-21 DIAGNOSIS — F1920 Other psychoactive substance dependence, uncomplicated: Secondary | ICD-10-CM | POA: Diagnosis present

## 2017-01-21 DIAGNOSIS — F1994 Other psychoactive substance use, unspecified with psychoactive substance-induced mood disorder: Secondary | ICD-10-CM

## 2017-01-21 LAB — CBC
HCT: 44.1 % (ref 39.0–52.0)
Hemoglobin: 15.7 g/dL (ref 13.0–17.0)
MCH: 31.6 pg (ref 26.0–34.0)
MCHC: 35.6 g/dL (ref 30.0–36.0)
MCV: 88.7 fL (ref 78.0–100.0)
PLATELETS: 259 10*3/uL (ref 150–400)
RBC: 4.97 MIL/uL (ref 4.22–5.81)
RDW: 12.8 % (ref 11.5–15.5)
WBC: 9.4 10*3/uL (ref 4.0–10.5)

## 2017-01-21 LAB — COMPREHENSIVE METABOLIC PANEL
ALT: 23 U/L (ref 17–63)
ANION GAP: 6 (ref 5–15)
AST: 25 U/L (ref 15–41)
Albumin: 4.9 g/dL (ref 3.5–5.0)
Alkaline Phosphatase: 70 U/L (ref 38–126)
BILIRUBIN TOTAL: 0.5 mg/dL (ref 0.3–1.2)
BUN: 11 mg/dL (ref 6–20)
CO2: 30 mmol/L (ref 22–32)
Calcium: 9.2 mg/dL (ref 8.9–10.3)
Chloride: 102 mmol/L (ref 101–111)
Creatinine, Ser: 0.93 mg/dL (ref 0.61–1.24)
GFR calc Af Amer: >60 mL/min (ref >60)
Glucose, Bld: 89 mg/dL (ref 65–99)
POTASSIUM: 4.2 mmol/L (ref 3.5–5.1)
Sodium: 138 mmol/L (ref 135–145)
Total Protein: 8.2 g/dL — ABNORMAL HIGH (ref 6.5–8.1)

## 2017-01-21 LAB — ETHANOL

## 2017-01-21 LAB — RAPID URINE DRUG SCREEN, HOSP PERFORMED
Amphetamines: NOT DETECTED
BENZODIAZEPINES: NOT DETECTED
Barbiturates: NOT DETECTED
COCAINE: NOT DETECTED
Opiates: NOT DETECTED
Tetrahydrocannabinol: NOT DETECTED

## 2017-01-21 LAB — SALICYLATE LEVEL: Salicylate Lvl: <7 mg/dL (ref 2.8–30.0)

## 2017-01-21 LAB — ACETAMINOPHEN LEVEL

## 2017-01-21 NOTE — ED Notes (Signed)
Pt now stating "I'm ready to go."  Encouraged pt to wait to see EDP.

## 2017-01-21 NOTE — ED Triage Notes (Addendum)
Per [name], pt is SI d/t his mom kicking him out of the house today because she saw a text from friends about ETOH.  Last took Delsym yesterday, has been walking all over throughout the day, denies any other drug or ETOH.  Has not taken other home meds (haldol & Wellbutrin).  Pt denies plan to this Clinical research associate.  Pt does present with cut marks to left hand.  Pt stated "I cut myself last night."

## 2017-01-21 NOTE — ED Notes (Signed)
Bed: WA27 Expected date:  Expected time:  Means of arrival:  Comments: 21 yo M/ Psych

## 2017-01-22 MED ORDER — IBUPROFEN 200 MG PO TABS: 600.0000 mg | ORAL_TABLET | Freq: Three times a day (TID) | ORAL | Status: DC | PRN

## 2017-01-22 MED ORDER — GABAPENTIN 100 MG PO CAPS
200.0000 mg | ORAL_CAPSULE | Freq: Two times a day (BID) | ORAL | Status: DC
Start: 2017-01-22 — End: 2017-01-22
  Administered 2017-01-22: 200 mg via ORAL
  Filled 2017-01-22: qty 2

## 2017-01-22 MED ORDER — RISPERIDONE 0.5 MG PO TABS
0.5000 mg | ORAL_TABLET | Freq: Two times a day (BID) | ORAL | Status: DC
  Administered 2017-01-22: 0.5 mg via ORAL
  Filled 2017-01-22: qty 1

## 2017-01-22 MED ORDER — ONDANSETRON HCL 4 MG PO TABS: 4.0000 mg | ORAL_TABLET | Freq: Three times a day (TID) | ORAL | Status: DC | PRN

## 2017-01-22 NOTE — ED Notes (Signed)
Emad denies current SI, HI, and AVH. He's been pleasant and cooperative this morning but has made frequent requests that are contrary to already-explained rules. He is receptive to redirection but requires it frequently. Pt seems younger than his years. Pt remains free from harm and monitored.

## 2017-01-22 NOTE — ED Notes (Signed)
Pt's mom, [name], called. After obtaining written permission from pt (in chart), spoke with her about pt. She feels a recent increase in Wellbutrin has led to him decompensating. She thinks it has led to an increase in delusional thinking and auditory hallucinations. She says pt thinks demons are talking to him. The AH improve to muted whispers with medication, she said.

## 2017-01-22 NOTE — ED Notes (Signed)
Hourly rounding reveals patient sleeping in room. No complaints, stable, in no acute distress. Q15 minute rounds and monitoring via Security Cameras to continue. 

## 2017-01-22 NOTE — Progress Notes (Signed)
01/22/17 1256:  LRT spoke with pt in the hallway and offered activities.  Pt met with LRT in dayroom to do activities.  Pt wanted to play checkers.  LRT and pt played a game of checkers then pt wanted to play UNO so we then played a game of UNO.  Pt then asked for some coloring sheets.  LRT printed out coloring sheets for pt and gave them to him.  Pt seemed drowsy but pleasant.   Victorino Sparrow, LRT/CTRS

## 2017-01-22 NOTE — ED Notes (Signed)
Dr. Donnald Garre @ [facility].

## 2017-01-22 NOTE — ED Notes (Signed)
Hourly rounding reveals patient in hall. No complaints, stable, in no acute distress. Q15 minute rounds and monitoring via Security Cameras to continue. 

## 2017-01-22 NOTE — ED Notes (Addendum)
Pt asked for sleeping pill and was told that pts weren't typically prescribed or given such at 1000.

## 2017-01-22 NOTE — ED Notes (Signed)
Bed: [facility] Expected date: 01/21/17 Expected time:  Means of arrival:  Comments:

## 2017-01-22 NOTE — BH Assessment (Addendum)
Tele Assessment Note   Thomas [name] is an 21 y.o. male, who presents voluntary and unaccompanied to [facility]. Pt reported, his ACTT Team increased his medication; he combined his medication and 5HPG. Pt reported, his mother found out he was mixing drugs/medication and kicked him out. Pt reported, "I'm scared I have no where to live." Pt reported, he had a "schizophrenia attack." Pt reported, a "schizophrenia attack" is when I'm stressed and hear stuff everywhere." Pt could not recall what he heard. Pt reported, he was suicidal earlier yesterday, but not currently. Pt then reported, he took three bottles of Delsym as a suicide attempt, when his mother kicked him out. Pt reported, cutting his left hand with a knife. Pt denied, HI. Pt denied experiencing depressive symptoms.   Pt denied abuse. Pt reported, drinking three bottles of Delsym. Pt reported, is linked to [facility], for medication management and counseling. Pt reported, he has an appointment today (01/22/2017) with his ACTT Team and they are linking him to programs. Pt has had multiple inpatient admissions.   Pt presented sleeping, disheveled in scrubs with slurred, incoherent speech. Pt's mood was depressed/anxious. Pt's affect was congruent with mood. Pt's eye contact was poor. Clinician had to redirect pt to answer questions during the assessment. Pt's thought process was relevant. Pt's judgement was impaired. Pt's concentration, insight and impulse control are poor. Pt was oriented x3 (year, city and state).  Initially pt reported, he could contract for safety outside [facility]. Pt then reported, he could not contract for safety outside [facility].   Diagnosis: Bi-polar 1 Disorder (HCC)  Past Medical History:  Past Medical History:  Diagnosis Date  . ADHD (attention deficit hyperactivity disorder)   . Anxiety   . Bipolar 1 disorder (HCC)   . Current smoker   . Depression   . Eating disorder   . Headache(784.0)   . History of ADHD  11/01/2015  . Medical history non-contributory   . Mental disorder   . Nicotine dependence 11/03/2015  . 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 No pertinent surgical history.  Family History: No family history on file.  Social History:  reports that he has been smoking Cigarettes.  He has been smoking about 0.00 packs per day for the past 4.00 years. He has never used smokeless tobacco. He reports that he uses drugs, including Marijuana, Cocaine, LSD, and MDMA (Ecstacy). He reports that he does not drink alcohol.  Additional Social History:  Alcohol / Drug Use Pain Medications: See MAR Prescriptions: See MAR Over the Counter: SeeMAR History of alcohol / drug use?: Yes Substance #1 Name of Substance 1: Delsym 1 - Age of First Use: UTA 1 - Amount (size/oz): Pt reported, drinking three bottles of Delsym yesterday as a suicide attempt. 1 - Frequency: UTA 1 - Duration: UTA 1 - Last Use / Amount: Pt reported, yesterday.   CIWA: CIWA-Ar BP: 131/76 Pulse Rate: 86 COWS:    PATIENT STRENGTHS: (choose at least two) Average or above average intelligence General fund of knowledge  Allergies: No Known Allergies  Home Medications:  (Not in a hospital admission)  OB/GYN Status:  No LMP for male patient.  General Assessment Data Location of Assessment: [facility] TTS Assessment: In system Is this a Tele or Face-to-Face Assessment?: Face-to-Face Is this an Initial Assessment or a Re-assessment for this encounter?: Initial Assessment Marital status: Single Is patient pregnant?: No Pregnancy Status: No Living Arrangements: Other (Comment) (Homeless) Admission Status: Voluntary Is  patient capable of signing voluntary admission?: Yes Referral Source: Self/Family/Friend Insurance type: [employer]     Crisis Care Plan Living Arrangements: Other (Comment) (Homeless) Legal Guardian: Other: (Self) Name of Psychiatrist: [facility] Name of Therapist:  [facility]  Education Status Is patient currently in school?: No Current Grade: NA Highest grade of school patient has completed: GED Name of school: NA Contact person: NA  Risk to self with the past 6 months Suicidal Ideation: No-Not Currently/Within Last 6 Months Has patient been a risk to self within the past 6 months prior to admission? : Yes Suicidal Intent: Yes-Currently Present Has patient had any suicidal intent within the past 6 months prior to admission? : Yes Is patient at risk for suicide?: Yes Suicidal Plan?: Yes-Currently Present Has patient had any suicidal plan within the past 6 months prior to admission? : Yes Specify Current Suicidal Plan: Pt reported, he drank three bottle of Delsym. Access to Means: Yes Specify Access to Suicidal Means: Pt has access to Delsym cough suppressant.  What has been your use of drugs/alcohol within the last 12 months?: Delsym. Previous Attempts/Gestures: Yes How many times?:  (Multiple) Other Self Harm Risks: Pt cut his left hand with a knife yesterday. Triggers for Past Attempts: Hallucinations, Unpredictable Intentional Self Injurious Behavior: Cutting Comment - Self Injurious Behavior: Pt cut his left hand with a knife yesterday. Family Suicide History: Yes Recent stressful life event(s): Other (Comment) (Homelessness) Persecutory voices/beliefs?: No Depression: No Substance abuse history and/or treatment for substance abuse?: Yes Suicide prevention information given to non-admitted patients: Not applicable  Risk to Others within the past 6 months Homicidal Ideation: No (Pt denies. ) Does patient have any lifetime risk of violence toward others beyond the six months prior to admission? : No Thoughts of Harm to Others: No Comment - Thoughts of Harm to Others: Pt denies.  Current Homicidal Intent: No Current Homicidal Plan: No Access to Homicidal Means: No Identified Victim: NA History of harm to others?: No Assessment of  Violence: None Noted Violent Behavior Description: NA Does patient have access to weapons?: Yes (Comment) (knives.) Criminal Charges Pending?: No Describe Pending Criminal Charges: NA Does patient have a court date: No Court Date:  (Na) Is patient on probation?: Yes (Per chart pt is on probation for breaking and entering. )  Psychosis Hallucinations: Auditory Delusions: None noted  Mental Status Report Appearance/Hygiene: Disheveled Eye Contact: Poor Motor Activity: Unremarkable Speech: Incoherent, Slurred Level of Consciousness: Quiet/awake Mood: Depressed, Anxious Affect: Appropriate to circumstance Anxiety Level: Moderate Thought Processes: Relevant Judgement: Impaired Orientation: Other (Comment) (year, city and state.) Obsessive Compulsive Thoughts/Behaviors: None  Cognitive Functioning Concentration: Fair Memory: Recent Intact IQ: Average Insight: Poor Impulse Control: Poor Appetite: Poor Weight Loss:  (20 pounds) Weight Gain: 0 Sleep: No Change Total Hours of Sleep: 8 Vegetative Symptoms: None  ADLScreening [facility] Assessment Services) Patient's cognitive ability adequate to safely complete daily activities?: Yes Patient able to express need for assistance with ADLs?: Yes Independently performs ADLs?: Yes (appropriate for developmental age)  Prior Inpatient Therapy Prior Inpatient Therapy: Yes Prior Therapy Dates: Multiple; 7 admits in last 12 months Prior Therapy Facilty/Provider(s): [facility], [facility], [facility], others Reason for Treatment: Bipolar Affective Disorder  Prior Outpatient Therapy Prior Outpatient Therapy: Yes Prior Therapy Dates: Current Prior Therapy Facilty/Provider(s): [facility] Reason for Treatment: Medication management and counseling. Does patient have an ACCT team?: Yes Does patient have Intensive In-House Services?  : No Does patient have [facility] services? : No Does patient  have P4CC services?: No  ADL Screening (condition at time of  admission) Patient's cognitive ability adequate to safely complete daily activities?: Yes Is the patient deaf or have difficulty hearing?: No Does the patient have difficulty seeing, even when wearing glasses/contacts?: No Does the patient have difficulty concentrating, remembering, or making decisions?: Yes Patient able to express need for assistance with ADLs?: Yes Does the patient have difficulty dressing or bathing?: No Independently performs ADLs?: Yes (appropriate for developmental age) Does the patient have difficulty walking or climbing stairs?: No Weakness of Legs: None Weakness of Arms/Hands: None       Abuse/Neglect Assessment (Assessment to be complete while patient is alone) Physical Abuse: Denies (Pt denies. ) Verbal Abuse: Denies (Pt denies. ) Sexual Abuse: Denies (Pt denies. ) Exploitation of patient/patient's resources: Denies (Pt denies. ) Self-Neglect: Denies (Pt denies. )     Advance Directives (For Healthcare) Does Patient Have a Medical Advance Directive?: No    Additional Information 1:1 In Past 12 Months?: No CIRT Risk: No Elopement Risk: No Does patient have medical clearance?: Yes     Disposition: Nira Conn, NP recommended inpatient treatment. Donnald Garre and Jillyn Hidden, RN. Per [place] [place], [facility] no appropriate beds available. TTS to seek placement.   Disposition Initial Assessment Completed for this Encounter: Yes Disposition of Patient: Other dispositions (Pending NP review. ) Other disposition(s): Other (Comment) (Pending NP review. )  Gwinda Passe 01/22/2017 1:45 AM   Gwinda Passe, MS, [facility], [facility] Triage Specialist [phone]

## 2017-01-22 NOTE — ED Notes (Signed)
Pt. Transferred to [facility] from ED to room 37 after screening for contraband. Report to include Situation, Background, Assessment and Recommendations from [facility]. Pt. Oriented to unit including Q15 minute rounds as well as the security cameras for their protection. Patient is alert and oriented, warm and dry in no acute distress. Patient denies SI, HI, and AVH. Pt. Encouraged to let me know if needs arise.

## 2017-01-22 NOTE — Progress Notes (Signed)
Report called to [facility].  Waiting on transportation via [facility] Department.

## 2017-01-22 NOTE — BH Assessment (Signed)
BHH Assessment Progress Note  Per Thedore Mins, MD, this pt requires psychiatric hospitalization at this time.  Pt presents under IVC initiated by EDP Arby Barrette, MD.  At 15:25 Thayer Ohm calls from [facility] to report that pt has been accepted to their facility by Jossie Ng, NP to the service of Dr Tonita Phoenix.  Pt will go to [facility] Rm 241-2.  Nanine Means, DNP, concurs with this decision.  Pt's nurse, Rayfield Citizen, has been notified, and agrees to call report to [phone].  Pt is to be transported via [facility].  Doylene Canning, MA Triage Specialist [phone]

## 2017-01-22 NOTE — ED Notes (Signed)
Patient has been resting quietly this shift.  A representative from [facility] came to visit him.  Patient denies any thoughts of self harm at this time and none observed.  Patient denies HI and does not appear to be responding to internal stimuli. Patient asked about disposition and informed patient that information has not been received by staff.  Patient's behavior has been appropriate.

## 2017-01-22 NOTE — ED Notes (Signed)
Discharge note:  Patient discharged from [facility].  He was picked up by [facility] Department to be transported to [facility].  Patient has been calm and cooperative.  Patient left ambulatory and does not appear to be in any distress.

## 2017-01-22 NOTE — ED Provider Notes (Signed)
[facility]-EMERGENCY DEPT Provider Note   CSN: [id number] Arrival date & time: 01/21/17  1954     History   Chief Complaint Chief Complaint  Patient presents with  . Suicidal    HPI Thomas [name] is a 21 y.o. male.  HPI Patient reports that he got kicked out of his home by his mother because of suspicion of alcohol use. Patient reports that he had taken a combination of Delsym with his Wellbutrin and ended up getting hallucinations. He reports things just seemed really distorted. He reports it is improving now. Patient reported that he did cut himself last night to nursing staff. Patient apparently reported suicidal thoughts to EMS because of getting kicked out. Denies suicidality to me and reports he has no plan and no intent. Past Medical History:  Diagnosis Date  . ADHD (attention deficit hyperactivity disorder)   . Anxiety   . Bipolar 1 disorder (HCC)   . Current smoker   . Depression   . Eating disorder   . Headache(784.0)   . History of ADHD 11/01/2015  . Medical history non-contributory   . Mental disorder   . Nicotine dependence 11/03/2015  . 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 Intentional drug overdose (HCC) 12/05/2016  . Acute encephalopathy 12/05/2016  . Sinus tachycardia 12/05/2016  . Hypotension 12/05/2016  . Overdose, intentional self-harm, initial encounter (HCC) 11/20/2016  . Intentional SSRI (selective serotonin reuptake inhibitor) overdose (HCC) 11/20/2016  . Substance induced mood disorder (HCC) 09/08/2016  . Homelessness 09/02/2016  . Bipolar affective disorder, current episode hypomanic (HCC) 07/21/2016  . Attention deficit hyperactivity disorder (ADHD) 07/03/2016  . 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 Tobacco use disorder 07/03/2016  . Unspecified depressive  disorder 07/03/2016  . 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 Dextromethorphan  overdose 06/03/2016  . Leukocytosis   . Cannabis use disorder, moderate, dependence (HCC) 12/04/2012    History reviewed. No pertinent surgical history.     Home Medications    Prior to Admission medications   Medication Sig Start Date End Date Taking? Authorizing Provider  OVER THE COUNTER MEDICATION Take 30 mLs by mouth once.   Yes Historical Provider, MD  buPROPion (WELLBUTRIN XL) 150 MG 24 hr tablet Take 150 mg by mouth daily.    Historical Provider, MD    Family History No family history on file.  Social History Social History  Substance Use Topics  . Smoking status: Current Some Day Smoker    Packs/day: 0.00    Years: 4.00    Types: Cigarettes  . Smokeless tobacco: Never Used  . Alcohol use No     Allergies   Patient has no known allergies.   Review of Systems Review of Systems 10 Systems reviewed and are negative for acute change except as noted in the HPI.   Physical Exam Updated Vital Signs BP 131/76 (BP Location: Left Arm)   Pulse 86   Temp 98 F (36.7 C) (Oral)   Resp 19   Ht  (1.651 m)   Wt 168 lb (76.2 kg)   SpO2 96%   BMI 27.96 kg/m   Physical Exam  Constitutional: He is oriented to person, place, and time. He appears well-developed and well-nourished. No distress.  HENT:  Head: Normocephalic and atraumatic.  Mouth/Throat: Oropharynx is clear and moist.  Eyes: Conjunctivae are normal.  Pupils are 6 mm and symmetric. Extraocular motions are intact with  slight lateral nystagmus.  Neck: Neck supple.  Cardiovascular: Normal rate and regular rhythm.   No murmur heard. Pulmonary/Chest: Effort normal and breath sounds normal. No respiratory distress.  Abdominal: Soft. There is no tenderness.  Musculoskeletal: Normal range of motion. He exhibits no edema or tenderness.  No cut marks on upper extremities and lower extremities no edema  Neurological: He is alert and oriented to person, place, and time. No cranial nerve deficit. He exhibits  normal muscle tone. Coordination normal.  Finger-nose examination intact. Gait intact.  Skin: Skin is warm and dry.  Psychiatric: He has a normal mood and affect.  Nursing note and vitals reviewed.    ED Treatments / Results  Labs (all labs ordered are listed, but only abnormal results are displayed) Labs Reviewed  COMPREHENSIVE METABOLIC PANEL - Abnormal; Notable for the following:       Result Value   Total Protein 8.2 (*)    All other components within normal limits  ACETAMINOPHEN LEVEL - Abnormal; Notable for the following:    Acetaminophen (Tylenol), Serum <10 (*)    All other components within normal limits  ETHANOL  SALICYLATE LEVEL  CBC  RAPID URINE DRUG SCREEN, HOSP PERFORMED    EKG  EKG Interpretation None       Radiology No results found.  Procedures Procedures (including critical care time)  Medications Ordered in ED Medications  ibuprofen (ADVIL,MOTRIN) tablet 600 mg (not administered)  ondansetron (ZOFRAN) tablet 4 mg (not administered)     Initial Impression / Assessment and Plan / ED Course  I have reviewed the triage vital signs and the nursing notes.  Pertinent labs & imaging results that were available during my care of the patient were reviewed by me and considered in my medical decision making (see chart for details).      Final Clinical Impressions(s) / ED Diagnoses   Final diagnoses:  Suicidal ideation  Polysubstance abuse   Patient has had variable report of suicidal ideation with recent stressor being kicked out of his home. Patient is medically cleared for psychiatric evaluation and disposition. Patient does exhibit risky behavior of abusing dextromethorphan combined with his prescribed medications. New Prescriptions New Prescriptions   No medications on file     Arby Barrette, MD 01/22/17 [phone]

## 2017-02-02 ENCOUNTER — Emergency Department (HOSPITAL_COMMUNITY)
Admission: EM | Admit: 2017-02-02 | Discharge: 2017-02-02 | Disposition: A | Discharge status: Home / Self Care | Payer: Medicaid Other | Attending: Emergency Medicine | Admitting: Emergency Medicine

## 2017-02-02 ENCOUNTER — Encounter (HOSPITAL_COMMUNITY): Payer: Self-pay

## 2017-02-02 DIAGNOSIS — F17210 Nicotine dependence, cigarettes, uncomplicated: Secondary | ICD-10-CM | POA: Diagnosis not present

## 2017-02-02 DIAGNOSIS — F32A Depression, unspecified: Secondary | ICD-10-CM

## 2017-02-02 DIAGNOSIS — Z79899 Other long term (current) drug therapy: Secondary | ICD-10-CM | POA: Insufficient documentation

## 2017-02-02 DIAGNOSIS — F329 Major depressive disorder, single episode, unspecified: Secondary | ICD-10-CM | POA: Diagnosis not present

## 2017-02-02 DIAGNOSIS — F909 Attention-deficit hyperactivity disorder, unspecified type: Secondary | ICD-10-CM | POA: Insufficient documentation

## 2017-02-02 LAB — RAPID URINE DRUG SCREEN, HOSP PERFORMED
AMPHETAMINES: NOT DETECTED
BARBITURATES: NOT DETECTED
BENZODIAZEPINES: NOT DETECTED
COCAINE: NOT DETECTED
Opiates: NOT DETECTED
Tetrahydrocannabinol: NOT DETECTED

## 2017-02-02 LAB — COMPREHENSIVE METABOLIC PANEL
ALBUMIN: 4.6 g/dL (ref 3.5–5.0)
ALK PHOS: 67 U/L (ref 38–126)
ALT: 36 U/L (ref 17–63)
AST: 25 U/L (ref 15–41)
Anion gap: 8 (ref 5–15)
BUN: 10 mg/dL (ref 6–20)
CALCIUM: 9.4 mg/dL (ref 8.9–10.3)
CO2: 23 mmol/L (ref 22–32)
CREATININE: 0.67 mg/dL (ref 0.61–1.24)
Chloride: 108 mmol/L (ref 101–111)
GFR calc Af Amer: >60 mL/min (ref >60)
GFR calc non Af Amer: >60 mL/min (ref >60)
GLUCOSE: 87 mg/dL (ref 65–99)
Potassium: 4.2 mmol/L (ref 3.5–5.1)
SODIUM: 139 mmol/L (ref 135–145)
Total Bilirubin: 0.5 mg/dL (ref 0.3–1.2)
Total Protein: 8.2 g/dL — ABNORMAL HIGH (ref 6.5–8.1)

## 2017-02-02 LAB — CBC
HCT: 42.4 % (ref 39.0–52.0)
Hemoglobin: 15.2 g/dL (ref 13.0–17.0)
MCH: 31.1 pg (ref 26.0–34.0)
MCHC: 35.8 g/dL (ref 30.0–36.0)
MCV: 86.7 fL (ref 78.0–100.0)
Platelets: 199 10*3/uL (ref 150–400)
RBC: 4.89 MIL/uL (ref 4.22–5.81)
RDW: 13.1 % (ref 11.5–15.5)
WBC: 8.2 10*3/uL (ref 4.0–10.5)

## 2017-02-02 LAB — ETHANOL: Alcohol, Ethyl (B): <5 mg/dL (ref <5)

## 2017-02-02 NOTE — BH Assessment (Addendum)
Tele Assessment Note   Thomas [name] is an 21 y.o. male, who presents voluntary and unaccompanied to [facility]. Pt reported, wanting to be put back on his Lithium, because he feels it will improve his mood. Clinician asked the pt did he consult with his ACTT Team of his medication request, pt responded, "dang I didn't think about that." Pt reported, he was kicked out of his mothers house three weeks ago for alcohol consumption. Pt reported, "I have mental health court on Monday (02/05/2017) and I'm going to rehab on Wednesday (02/07/2017)." Pt asked if he was going to be discharged from [facility] before Monday (02/05/2017). Pt reported, feeling down since he was discharged from [facility]. Pt denies, SI, HI, AVH and self-injurious behaviors. Clinician observed a pentagram carved into the pt's left arm, pt reported, "that's old." Clinician observed no new marks on pt's arm.   Pt denies abuse. Pt reported, using "DXM, Molly and weed," however not recently. Pt's UDS is negative. Pt's BAL is <5. Pt reported, seeing his ACTT Team "the other day." Pt reported, taking his medication as prescribed however, pt was unable to recall his current medications. Pt reported, previous inpatient admissions.   Pt presents alert, disheveled in scrubs with logical/coherent speech. Pt's eye contact was poor. Pt's mood was anxious. Pt's affect was congruent to mood. Pt's thought process was relevant/coherent. Pt's judgement was partial. Pt's concentration was normal. Pt's insight and impulse control are poor. Pt was oriented x4 (day, year, city and state). Pt reported, if discharged from [facility] he could contract for safety. Pt reported, if inpatient treatment was recommended he would sign-in voluntarily.   Diagnosis: Bi-polar 1 Disorder (HCC)   Past Medical History:  Past Medical History:  Diagnosis Date  . ADHD (attention deficit hyperactivity disorder)   . Anxiety   . Bipolar 1 disorder (HCC)   . Current smoker   .  Depression   . Eating disorder   . Headache(784.0)   . History of ADHD 11/01/2015  . Medical history non-contributory   . Mental disorder   . Nicotine dependence 11/03/2015  . 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 No pertinent surgical history.  Family History: History reviewed. No pertinent family history.  Social History:  reports that he has been smoking Cigarettes.  He has been smoking about 0.00 packs per day for the past 4.00 years. He has never used smokeless tobacco. He reports that he uses drugs, including Marijuana, Cocaine, LSD, and MDMA (Ecstacy). He reports that he does not drink alcohol.  Additional Social History:  Alcohol / Drug Use Pain Medications: See MAR Prescriptions: See MAR Over the Counter: SeeMAR History of alcohol / drug use?: Yes Substance #1 Name of Substance 1: Delsym 1 - Age of First Use: UTA 1 - Amount (size/oz): Pt reported, drinking two bottles of Delsym at different times a little over a week ago.  1 - Frequency: UTA 1 - Duration: UTA 1 - Last Use / Amount: Pt reported, a little over a week ago.   CIWA: CIWA-Ar BP: 130/74 Pulse Rate: (!) 102 (Nurse was notified) COWS:    PATIENT STRENGTHS: (choose at least two) Average or above average intelligence Supportive family/friends  Allergies: No Known Allergies  Home Medications:  (Not in a hospital admission)  OB/GYN Status:  No LMP for male patient.  General Assessment Data Location of Assessment: [facility] TTS Assessment: In system Is this a Tele or Face-to-Face Assessment?: Face-to-Face Is this an Initial  Assessment or a Re-assessment for this encounter?: Initial Assessment Marital status: Single Is patient pregnant?: No Pregnancy Status: No Living Arrangements: Other (Comment) (Homless) Can pt return to current living arrangement?: Yes Admission Status: Voluntary Is patient capable of signing voluntary admission?: Yes Referral Source:  Self/Family/Friend Insurance type: [facility]     Crisis Care Plan Living Arrangements: Other (Comment) (Homless) Legal Guardian: Other: (Self) Name of Psychiatrist: [facility] Name of Therapist: [facility]  Education Status Is patient currently in school?: No Current Grade: NA Highest grade of school patient has completed: GED Name of school: NA Contact person: NA  Risk to self with the past 6 months Suicidal Ideation: No-Not Currently/Within Last 6 Months Has patient been a risk to self within the past 6 months prior to admission? : Yes Suicidal Intent: No Has patient had any suicidal intent within the past 6 months prior to admission? : Yes Is patient at risk for suicide?: No Suicidal Plan?: No Has patient had any suicidal plan within the past 6 months prior to admission? : Yes Specify Current Suicidal Plan: Pt denies.  Access to Means: Yes Specify Access to Suicidal Means: Pt has access to Delsym.  What has been your use of drugs/alcohol within the last 12 months?: Pt reported, Delsym a little over a week ago.  Previous Attempts/Gestures: Yes How many times?:  (Multiple) Other Self Harm Risks: Per EDP note pt cut himself last night. Pt denies.  Triggers for Past Attempts: Hallucinations, Unpredictable Intentional Self Injurious Behavior: Cutting Comment - Self Injurious Behavior: Per EDP note pt cut himself last night. Pt denies.  Family Suicide History: Yes Recent stressful life event(s): Other (Comment) (Homelessness.) Persecutory voices/beliefs?: No Depression: No Depression Symptoms:  (feeling down) Substance abuse history and/or treatment for substance abuse?: Yes Suicide prevention information given to non-admitted patients: Not applicable  Risk to Others within the past 6 months Homicidal Ideation: No (Pt denies. ) Does patient have any lifetime risk of violence toward others beyond the six months prior to admission? : No Thoughts of Harm to  Others: No Comment - Thoughts of Harm to Others: Pt denies.  Current Homicidal Intent: No Current Homicidal Plan: No Access to Homicidal Means: No Identified Victim: NA History of harm to others?: No Assessment of Violence: None Noted Violent Behavior Description: NA Does patient have access to weapons?: No (Pt denies.) Criminal Charges Pending?: Yes Describe Pending Criminal Charges: Breaking and entering; larceny after breaking and entering. Does patient have a court date: Yes Court Date: 02/05/17 Is patient on probation?: Yes (For breaking and entering. )  Psychosis Hallucinations: None noted Delusions: None noted  Mental Status Report Appearance/Hygiene: Disheveled Eye Contact: Poor Motor Activity: Restlessness Speech: Logical/coherent Level of Consciousness: Alert Mood: Anxious Affect: Other (Comment) (congruent with mood. ) Anxiety Level: Moderate Thought Processes: Relevant, Coherent Judgement: Partial Orientation: Other (Comment) (day, year, city and state.) Obsessive Compulsive Thoughts/Behaviors: None  Cognitive Functioning Concentration: Normal Memory: Recent Intact IQ: Average Insight: Poor Impulse Control: Poor Appetite: Good Weight Loss: 0 Weight Gain: 0 Sleep: No Change Total Hours of Sleep: 8 Vegetative Symptoms: None  ADLScreening [facility] Assessment Services) Patient's cognitive ability adequate to safely complete daily activities?: Yes Patient able to express need for assistance with ADLs?: Yes Independently performs ADLs?: Yes (appropriate for developmental age)  Prior Inpatient Therapy Prior Inpatient Therapy: Yes Prior Therapy Dates: Multiple; 8 admits in last 12 months Prior Therapy Facilty/Provider(s): [facility], [facility], [facility], others Reason for Treatment: Bipolar Affective Disorder  Prior  Outpatient Therapy Prior Outpatient Therapy: Yes Prior Therapy Dates: Current Prior Therapy Facilty/Provider(s): [facility] Reason for Treatment:  Medication management and counseling. Does patient have an ACCT team?: Yes Does patient have Intensive In-House Services?  : No Does patient have [facility] services? : No Does patient have P4CC services?: No  ADL Screening (condition at time of admission) Patient's cognitive ability adequate to safely complete daily activities?: Yes Is the patient deaf or have difficulty hearing?: No Does the patient have difficulty seeing, even when wearing glasses/contacts?: No Patient able to express need for assistance with ADLs?: Yes Does the patient have difficulty dressing or bathing?: No Independently performs ADLs?: Yes (appropriate for developmental age) Does the patient have difficulty walking or climbing stairs?: No Weakness of Legs: None Weakness of Arms/Hands: None       Abuse/Neglect Assessment (Assessment to be complete while patient is alone) Physical Abuse: Denies (Pt denies.) Verbal Abuse: Denies (Pt denies. ) Sexual Abuse: Denies (Pt denies. ) Exploitation of patient/patient's resources: Denies (Pt denies. ) Self-Neglect: Denies (Pt denies. )     Advance Directives (For Healthcare) Does Patient Have a Medical Advance Directive?: No Would patient like information on creating a medical advance directive?: No - Patient declined    Additional Information 1:1 In Past 12 Months?: No CIRT Risk: No Elopement Risk: No Does patient have medical clearance?: Yes     Disposition: Nira Conn, NP recommends discharge and follow up with ACTT Team, Clydie Braun, [place] is in agreement. Disposition discussed with Jillyn Hidden, RN.  Disposition Initial Assessment Completed for this Encounter: Yes Disposition of Patient: Other dispositions (Pending NP review.) Other disposition(s): Other (Comment) (Pending NP review. )  Gwinda Passe 02/02/2017 8:46 PM   Gwinda Passe, MS, [facility], [facility] Triage Specialist [phone]

## 2017-02-02 NOTE — ED Notes (Signed)
Report to include Situation, Background, Assessment, and Recommendations received from Diane RN. Patient alert and oriented, warm and dry, in no acute distress. Patient denies SI, HI, AVH and pain. Patient made aware of Q15 minute rounds and security cameras for their safety. Patient instructed to come to me with needs or concerns. 

## 2017-02-02 NOTE — ED Notes (Signed)
Report to include Situation, Background, Assessment, and Recommendations received from RN. Patient alert and oriented, warm and dry, in no acute distress. Patient denies SI, HI, AVH and pain. Patient made aware of Q15 minute rounds and security cameras for their safety. Patient instructed to come to me with needs or concerns.  

## 2017-02-02 NOTE — Discharge Instructions (Signed)
Follow up with the ACT team as scheduled.

## 2017-02-02 NOTE — ED Provider Notes (Signed)
[facility]-EMERGENCY DEPT Provider Note   CSN: [id number] Arrival date & time: 02/02/17  1406     History   Chief Complaint Chief Complaint  Patient presents with  . medication changes    HPI Thomas [name] is a 21 y.o. male.  The history is provided by the patient. No language interpreter was used.  Depression  This is a new problem. Episode onset: 2 weeks. The problem occurs constantly. The problem has been gradually worsening. Pertinent negatives include no chest pain, no abdominal pain, no headaches and no shortness of breath. Nothing aggravates the symptoms. Nothing relieves the symptoms. He has tried nothing for the symptoms. The treatment provided no relief.  Pt reports he has a history of depression and substance abuse.  Pt reports he is currently homeless due to his substance abuse.  Pt reports he was started on depakote when he was hospitalized earlier this month.  Pt reports he feels like depakote is making him more depressed.  Pt reports he was given a shot of haldol and he has felt tired since.  Pt reports he has no energy and does not want to participate in any activities. Pt reports he had an argument with friend earlier today.  Pt feels like he needs a different medication.  Pt reports he can not tolerate feeling this down.  Pt reports feeling like he has to do something.   Pt states he thinks he needs to be on lithium.   Past Medical History:  Diagnosis Date  . ADHD (attention deficit hyperactivity disorder)   . Anxiety   . Bipolar 1 disorder (HCC)   . Current smoker   . Depression   . Eating disorder   . Headache(784.0)   . History of ADHD 11/01/2015  . Medical history non-contributory   . Mental disorder   . Nicotine dependence 11/03/2015  . 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 Intentional drug overdose (HCC) 12/05/2016  . Acute encephalopathy 12/05/2016  . Sinus tachycardia  12/05/2016  . Hypotension 12/05/2016  . Overdose, intentional self-harm, initial encounter (HCC) 11/20/2016  . Intentional SSRI (selective serotonin reuptake inhibitor) overdose (HCC) 11/20/2016  . Substance induced mood disorder (HCC) 09/08/2016  . Homelessness 09/02/2016  . Bipolar affective disorder, current episode hypomanic (HCC) 07/21/2016  . Attention deficit hyperactivity disorder (ADHD) 07/03/2016  . 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 Tobacco use disorder 07/03/2016  . Unspecified depressive  disorder 07/03/2016  . 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 Dextromethorphan overdose 06/03/2016  . Leukocytosis   . Cannabis use disorder, moderate, dependence (HCC) 12/04/2012    History reviewed. No pertinent surgical history.     Home Medications    Prior to Admission medications   Medication Sig Start Date End Date Taking? Authorizing Provider  buPROPion (WELLBUTRIN XL) 150 MG 24 hr tablet Take 150 mg by mouth daily.    Historical Provider, MD  OVER THE COUNTER MEDICATION Take 30 mLs by mouth once.    Historical Provider, MD    Family History History reviewed. No pertinent family history.  Social History Social History  Substance Use Topics  . Smoking status: Current Some Day Smoker    Packs/day: 0.00    Years: 4.00    Types: Cigarettes  . Smokeless tobacco: Never Used  . Alcohol use No     Allergies   Patient has no known allergies.   Review of Systems Review of  Systems  Respiratory: Negative for shortness of breath.   Cardiovascular: Negative for chest pain.  Gastrointestinal: Negative for abdominal pain.  Neurological: Negative for headaches.  Psychiatric/Behavioral: Positive for depression.  All other systems reviewed and are negative.    Physical Exam Updated Vital Signs BP 130/74 (BP Location: Left Arm)   Pulse 95   Temp 97.7 F (36.5 C) (Oral)   SpO2 100%   Physical Exam  Constitutional: He appears  well-developed and well-nourished.  HENT:  Head: Normocephalic and atraumatic.  Eyes: Conjunctivae are normal.  Neck: Neck supple.  Cardiovascular: Normal rate and regular rhythm.   No murmur heard. Pulmonary/Chest: Effort normal and breath sounds normal. No respiratory distress.  Abdominal: Soft. There is no tenderness.  Musculoskeletal: He exhibits no edema.  Neurological: He is alert.  Skin: Skin is warm and dry.  Psychiatric: He has a normal mood and affect.  Nursing note and vitals reviewed.    ED Treatments / Results  Labs (all labs ordered are listed, but only abnormal results are displayed) Labs Reviewed  COMPREHENSIVE METABOLIC PANEL - Abnormal; Notable for the following:       Result Value   Total Protein 8.2 (*)    All other components within normal limits  CBC  RAPID URINE DRUG SCREEN, HOSP PERFORMED  ETHANOL    EKG  EKG Interpretation None       Radiology No results found.  Procedures Procedures (including critical care time)  Medications Ordered in ED Medications - No data to display   Initial Impression / Assessment and Plan / ED Course  I have reviewed the triage vital signs and the nursing notes.  Pertinent labs & imaging results that were available during my care of the patient were reviewed by me and considered in my medical decision making (see chart for details).       Final Clinical Impressions(s) / ED Diagnoses   Final diagnoses:  Depression, unspecified depression type    New Prescriptions New Prescriptions   No medications on file  See TTS assessment.  Follow up with ACT as scheduled   Elson Areas, PA-C 02/02/17 2031    Arby Barrette, MD 02/03/17 [phone]

## 2017-02-02 NOTE — ED Notes (Signed)
Manual pulse 89.

## 2017-02-02 NOTE — ED Notes (Signed)
Introduced self to patient. Pt oriented to unit expectations.  Assessed pt for:  A) Anxiety &/or agitation: On admission to the [facility] pt is calm and cooperative. He said that he came in because he was having "bad thoughts of hurting himself" and felt that he needed help. His affect and mood appear appropriate. He said that in the past he was always listening to and trying to find the devil and black magic. But now, he feels that God is trying to find him. He prayed and for the first time, "I had a glimpse of happiness."  He said that he would like to take lithium again because that was when he was the happiest.  He said that he plans to go to rehab for his drug use and has a meeting next week. If he goes to rehab his mother will allow him home. He shared that his mother was on lithium when he was born. Unlike in past admissions, he is not being silly and immature acting.   S) Safety: Safety maintained with q-15-minute checks and hourly rounds by staff.  A) ADLs: Pt able to perform ADLs independently.  P) Pick-Up (room cleanliness):

## 2017-02-02 NOTE — ED Notes (Signed)
Hourly rounding reveals patient in hall. No complaints, stable, in no acute distress. Q15 minute rounds and monitoring via Security Cameras to continue. 

## 2017-02-02 NOTE — ED Triage Notes (Signed)
Pt recently had shot of "psych" meds.  States he doesn't feel right.  That he walks down the road and has odd thoughts.  Has stopped his meds b/c how he feels.  Pt denies SI/HI.  To [facility] to [facility] soon but cannot wait until then to have meds evaluated.

## 2017-02-03 ENCOUNTER — Emergency Department (HOSPITAL_COMMUNITY)
Admission: EM | Admit: 2017-02-03 | Discharge: 2017-02-03 | Disposition: A | Discharge status: Home / Self Care | Payer: Medicaid Other | Attending: Emergency Medicine | Admitting: Emergency Medicine

## 2017-02-03 ENCOUNTER — Encounter (HOSPITAL_COMMUNITY): Payer: Self-pay | Admitting: Oncology

## 2017-02-03 DIAGNOSIS — R440 Auditory hallucinations: Secondary | ICD-10-CM | POA: Diagnosis not present

## 2017-02-03 DIAGNOSIS — Z5900 Homelessness unspecified: Secondary | ICD-10-CM

## 2017-02-03 DIAGNOSIS — Z79899 Other long term (current) drug therapy: Secondary | ICD-10-CM | POA: Diagnosis not present

## 2017-02-03 DIAGNOSIS — F909 Attention-deficit hyperactivity disorder, unspecified type: Secondary | ICD-10-CM | POA: Diagnosis not present

## 2017-02-03 DIAGNOSIS — F17210 Nicotine dependence, cigarettes, uncomplicated: Secondary | ICD-10-CM | POA: Insufficient documentation

## 2017-02-03 NOTE — ED Triage Notes (Signed)
Pt d/c'd at 2030 from [facility].  Pt presents d/t AVH.  States that he was trying to sleep in his mom's shed and, "The wind began to talk to me."

## 2017-02-03 NOTE — ED Notes (Signed)
Bed: WTR5 Expected date:  Expected time:  Means of arrival:  Comments: 

## 2017-02-03 NOTE — ED Notes (Signed)
Pt refusing d/c instructions, e-sign, pain score and vitals.  States, "I don't need any of that."

## 2017-02-05 ENCOUNTER — Emergency Department (HOSPITAL_COMMUNITY)
Admission: EM | Admit: 2017-02-05 | Discharge: 2017-02-06 | Disposition: A | Discharge status: Home / Self Care | Payer: Medicaid Other | Attending: Emergency Medicine | Admitting: Emergency Medicine

## 2017-02-05 ENCOUNTER — Encounter (HOSPITAL_COMMUNITY): Payer: Self-pay | Admitting: *Deleted

## 2017-02-05 DIAGNOSIS — R441 Visual hallucinations: Secondary | ICD-10-CM | POA: Insufficient documentation

## 2017-02-05 DIAGNOSIS — R440 Auditory hallucinations: Secondary | ICD-10-CM | POA: Diagnosis present

## 2017-02-05 DIAGNOSIS — F310 Bipolar disorder, current episode hypomanic: Secondary | ICD-10-CM | POA: Diagnosis present

## 2017-02-05 DIAGNOSIS — F909 Attention-deficit hyperactivity disorder, unspecified type: Secondary | ICD-10-CM | POA: Diagnosis not present

## 2017-02-05 DIAGNOSIS — Z79899 Other long term (current) drug therapy: Secondary | ICD-10-CM | POA: Diagnosis not present

## 2017-02-05 DIAGNOSIS — Z5900 Homelessness unspecified: Secondary | ICD-10-CM

## 2017-02-05 DIAGNOSIS — F1994 Other psychoactive substance use, unspecified with psychoactive substance-induced mood disorder: Secondary | ICD-10-CM | POA: Diagnosis not present

## 2017-02-05 DIAGNOSIS — F319 Bipolar disorder, unspecified: Secondary | ICD-10-CM | POA: Insufficient documentation

## 2017-02-05 DIAGNOSIS — F17210 Nicotine dependence, cigarettes, uncomplicated: Secondary | ICD-10-CM | POA: Diagnosis not present

## 2017-02-05 NOTE — ED Provider Notes (Signed)
[facility]-EMERGENCY DEPT Provider Note   CSN: [id number] Arrival date & time: 02/05/17  2141  By signing my name below, I, Thomas [name], attest that this documentation has been prepared under the direction and in the presence of non-physician practitioner, Thomas Forth, PA-C. Electronically Signed: Modena [name], Scribe. 02/05/2017. 11:19 PM.  History   Chief Complaint Chief Complaint  Patient presents with  . Psychiatric Evaluation  . Homeless   The history is provided by the patient. No language interpreter was used.   HPI Comments: Thomas [name] is a 21 y.o. male with a PMHx of bipolar 1 disorder, anxiety, and depression who presents to the Emergency Department complaining of visual/auditory hallucinations that started today. No recent drug use. He states "he might have seen the devil" and "he is scared he is going to die". He keeps hearing things that he cannot describe. He "went to [employer] today and bought and ate nutmeg, because [he] was upset." He admits to smoking at least 1 cigarette daily. Denies any recent alcohol use, hx of alcohol abuse, SI, HI, or other complaints at this time.  Record review shows that pt has been seem numerous time in the past.    Past Medical History:  Diagnosis Date  . ADHD (attention deficit hyperactivity disorder)   . Anxiety   . Bipolar 1 disorder (HCC)   . Current smoker   . Depression   . Eating disorder   . Headache(784.0)   . History of ADHD 11/01/2015  . Medical history non-contributory   . Mental disorder   . Nicotine dependence 11/03/2015  . 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 Intentional drug overdose (HCC) 12/05/2016  . Acute encephalopathy 12/05/2016  . Sinus tachycardia 12/05/2016  . Hypotension 12/05/2016  . Overdose, intentional self-harm, initial encounter (HCC) 11/20/2016  . Intentional SSRI (selective serotonin reuptake inhibitor)  overdose (HCC) 11/20/2016  . Substance induced mood disorder (HCC) 09/08/2016  . Homelessness 09/02/2016  . Bipolar affective disorder, current episode hypomanic (HCC) 07/21/2016  . Attention deficit hyperactivity disorder (ADHD) 07/03/2016  . 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 Tobacco use disorder 07/03/2016  . Unspecified depressive  disorder 07/03/2016  . 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 Dextromethorphan overdose 06/03/2016  . Leukocytosis   . Cannabis use disorder, moderate, dependence (HCC) 12/04/2012    History reviewed. No pertinent surgical history.     Home Medications    Prior to Admission medications   Medication Sig Start Date End Date Taking? Authorizing Provider  divalproex (DEPAKOTE) 500 MG DR tablet Take 500 mg by mouth 2 (two) times daily. 01/29/17  Yes Historical Provider, MD  risperiDONE (RISPERDAL) 1 MG tablet Take 1 mg by mouth 2 (two) times daily. 01/29/17 02/28/17 Yes Historical Provider, MD    Family History No family history on file.  Social History Social History  Substance Use Topics  . Smoking status: Current Some Day Smoker    Packs/day: 0.00    Years: 4.00    Types: Cigarettes  . Smokeless tobacco: Never Used  . Alcohol use No     Allergies   Patient has no known allergies.   Review of Systems Review of Systems  Psychiatric/Behavioral: Positive for hallucinations (Auditory/visual). Negative for self-injury and suicidal ideas.  All other systems reviewed and are negative.    Physical Exam Updated Vital Signs BP 133/74 (BP Location: Left Arm)   Pulse 97   Temp 98.2  F (36.8 C)   Resp 18   Ht  (1.651 m)   Wt 168 lb (76.2 kg)   SpO2 97%   BMI 27.96 kg/m   Physical Exam  Constitutional: He appears well-developed and well-nourished. He is sleeping. No distress.  Pt sleeping and very difficult to arouse  HENT:  Head: Normocephalic and atraumatic.  Mouth/Throat: Oropharynx is clear and  moist. No oropharyngeal exudate.  Eyes: Conjunctivae are normal. No scleral icterus.  Neck: Normal range of motion. Neck supple.  Cardiovascular: Normal rate, regular rhythm and intact distal pulses.   Pulmonary/Chest: Effort normal and breath sounds normal. No respiratory distress. He has no wheezes.  Equal chest expansion  Abdominal: Soft. Bowel sounds are normal. He exhibits no mass. There is no tenderness. There is no rebound and no guarding.  Musculoskeletal: Normal range of motion. He exhibits no edema.  Neurological: GCS eye subscore is 3. GCS verbal subscore is 5. GCS motor subscore is 6.  Pt answers questions appropriately   Skin: Skin is warm and dry. He is not diaphoretic.  Psychiatric: He has a normal mood and affect.  Nursing note and vitals reviewed.    ED Treatments / Results  DIAGNOSTIC STUDIES: Oxygen Saturation is 97% on RA, normal by my interpretation.    COORDINATION OF CARE: 11:23 PM- Pt advised of plan for treatment and pt agrees.  Labs (all labs ordered are listed, but only abnormal results are displayed) Labs Reviewed  COMPREHENSIVE METABOLIC PANEL - Abnormal; Notable for the following:       Result Value   Glucose, Bld 127 (*)    All other components within normal limits  CBC WITH DIFFERENTIAL/PLATELET - Abnormal; Notable for the following:    HCT 38.2 (*)    All other components within normal limits  ACETAMINOPHEN LEVEL - Abnormal; Notable for the following:    Acetaminophen (Tylenol), Serum <10 (*)    All other components within normal limits  ETHANOL  RAPID URINE DRUG SCREEN, HOSP PERFORMED  SALICYLATE LEVEL  CBC    EKG  EKG Interpretation  Date/Time:  Monday February 05 2017 23:46:56 EDT Ventricular Rate:  94 PR Interval:    QRS Duration: 93 QT Interval:  337 QTC Calculation: 422 R Axis:   5 Text Interpretation:  Sinus rhythm Borderline T abnormalities, inferior leads new TWI in inferior leads Confirmed by Erroll Luna [phone]) on  02/06/2017 5:27:29 AM       Procedures Procedures (including critical care time)  Medications Ordered in ED Medications  LORazepam (ATIVAN) tablet 1 mg (not administered)  ibuprofen (ADVIL,MOTRIN) tablet 600 mg (not administered)  nicotine (NICODERM CQ - dosed in mg/24 hours) patch 21 mg (not administered)  ondansetron (ZOFRAN) tablet 4 mg (not administered)  alum & mag hydroxide-simeth (MAALOX/MYLANTA) 200-200-20 MG/5ML suspension 30 mL (not administered)  divalproex (DEPAKOTE) DR tablet 500 mg (500 mg Oral Given 02/06/17 0237)  risperiDONE (RISPERDAL) tablet 1 mg (1 mg Oral Given 02/06/17 0237)     Initial Impression / Assessment and Plan / ED Course  I have reviewed the triage vital signs and the nursing notes.  Pertinent labs & imaging results that were available during my care of the patient were reviewed by me and considered in my medical decision making (see chart for details).  Clinical Course as of Feb 06 525  Tue Feb 06, 2017  0237 Discussed with Thurston Pounds of [facility] who recommends AM psyc.    [HM]    Clinical Course User Index [HM]  Samik Balkcom, PA-C    Pt with c/o auditory and visual hallucinations.  Denies SI/HI.  He reports at triage that he has been days without sleep, but then reports that he slept in the waiting room at [facility] last night.  Labs reassuring.  Pt is medically cleared for TTS evaluation.   ECG shows T-wave inversions.  He adamantly denies CP, SOB, weakness, nausea, diaphoresis.  Doubt ACS.     Final Clinical Impressions(s) / ED Diagnoses   Final diagnoses:  Homelessness  Auditory hallucinations  Visual hallucinations  Bipolar affective disorder, remission status unspecified (HCC)    New Prescriptions New Prescriptions   No medications on file   I personally performed the services described in this documentation, which was scribed in my presence. The recorded information has been reviewed and is accurate.     Dahlia Client Shawndale Kilpatrick,  PA-C 02/06/17 1610    Nira Conn, MD 02/07/17 [phone]

## 2017-02-05 NOTE — ED Notes (Signed)
Bed: AV40 Expected date:  Expected time:  Means of arrival:  Comments: held

## 2017-02-05 NOTE — ED Triage Notes (Addendum)
Pt stated "I slept here in the waiting room all night.  My friend I was with in [place] was driving me around & then he brought me here.  I saw him have a panic attack and it scared me.  He's 52 and I think that's weird.  I think I've made Jesus mad.  I was at the homeless shelter today and I saw the mistakes I've made.  I did nutmeg today.  I need a 'psychic' evaluation.  I haven't slept in days.  I'm tripping because I did nutmeg and I'm tired."

## 2017-02-06 LAB — COMPREHENSIVE METABOLIC PANEL
ALK PHOS: 60 U/L (ref 38–126)
ALT: 30 U/L (ref 17–63)
AST: 21 U/L (ref 15–41)
Albumin: 3.8 g/dL (ref 3.5–5.0)
Anion gap: 7 (ref 5–15)
BILIRUBIN TOTAL: 0.4 mg/dL (ref 0.3–1.2)
BUN: 11 mg/dL (ref 6–20)
CALCIUM: 9 mg/dL (ref 8.9–10.3)
CO2: 27 mmol/L (ref 22–32)
CREATININE: 0.84 mg/dL (ref 0.61–1.24)
Chloride: 104 mmol/L (ref 101–111)
GFR calc non Af Amer: >60 mL/min (ref >60)
Glucose, Bld: 127 mg/dL — ABNORMAL HIGH (ref 65–99)
Potassium: 3.9 mmol/L (ref 3.5–5.1)
Sodium: 138 mmol/L (ref 135–145)
Total Protein: 6.6 g/dL (ref 6.5–8.1)

## 2017-02-06 LAB — CBC WITH DIFFERENTIAL/PLATELET
BASOS ABS: 0 10*3/uL (ref 0.0–0.1)
Basophils Relative: 0 %
EOS ABS: 0.3 10*3/uL (ref 0.0–0.7)
EOS PCT: 3 %
HCT: 38.2 % — ABNORMAL LOW (ref 39.0–52.0)
Hemoglobin: 13.7 g/dL (ref 13.0–17.0)
LYMPHS PCT: 33 %
Lymphs Abs: 2.6 10*3/uL (ref 0.7–4.0)
MCH: 30.9 pg (ref 26.0–34.0)
MCHC: 35.9 g/dL (ref 30.0–36.0)
MCV: 86 fL (ref 78.0–100.0)
MONO ABS: 0.9 10*3/uL (ref 0.1–1.0)
Monocytes Relative: 11 %
Neutro Abs: 4 10*3/uL (ref 1.7–7.7)
Neutrophils Relative %: 53 %
PLATELETS: 210 10*3/uL (ref 150–400)
RBC: 4.44 MIL/uL (ref 4.22–5.81)
RDW: 13 % (ref 11.5–15.5)
WBC: 7.7 10*3/uL (ref 4.0–10.5)

## 2017-02-06 LAB — RAPID URINE DRUG SCREEN, HOSP PERFORMED
AMPHETAMINES: NOT DETECTED
BENZODIAZEPINES: NOT DETECTED
Barbiturates: NOT DETECTED
Cocaine: NOT DETECTED
OPIATES: NOT DETECTED
Tetrahydrocannabinol: NOT DETECTED

## 2017-02-06 LAB — ETHANOL: Alcohol, Ethyl (B): <5 mg/dL (ref <5)

## 2017-02-06 LAB — SALICYLATE LEVEL: Salicylate Lvl: <7 mg/dL (ref 2.8–30.0)

## 2017-02-06 LAB — ACETAMINOPHEN LEVEL

## 2017-02-06 MED ORDER — RISPERIDONE 1 MG PO TABS
1.0000 mg | ORAL_TABLET | Freq: Two times a day (BID) | ORAL | Status: DC
  Administered 2017-02-06 (×2): 1 mg via ORAL
  Filled 2017-02-06 (×2): qty 1

## 2017-02-06 MED ORDER — LORAZEPAM 1 MG PO TABS: 1.0000 mg | ORAL_TABLET | Freq: Three times a day (TID) | ORAL | Status: DC | PRN

## 2017-02-06 MED ORDER — IBUPROFEN 200 MG PO TABS: 600.0000 mg | ORAL_TABLET | Freq: Three times a day (TID) | ORAL | Status: DC | PRN

## 2017-02-06 MED ORDER — ALUM & MAG HYDROXIDE-SIMETH 200-200-20 MG/5ML PO SUSP: 30.0000 mL | ORAL | Status: DC | PRN

## 2017-02-06 MED ORDER — NICOTINE 21 MG/24HR TD PT24
21.0000 mg | MEDICATED_PATCH | Freq: Every day | TRANSDERMAL | Status: DC
  Administered 2017-02-06: 21 mg via TRANSDERMAL
  Filled 2017-02-06: qty 1

## 2017-02-06 MED ORDER — ONDANSETRON HCL 4 MG PO TABS: 4.0000 mg | ORAL_TABLET | Freq: Three times a day (TID) | ORAL | Status: DC | PRN

## 2017-02-06 MED ORDER — DIVALPROEX SODIUM 500 MG PO DR TAB
500.0000 mg | DELAYED_RELEASE_TABLET | Freq: Two times a day (BID) | ORAL | Status: DC
  Administered 2017-02-06 (×2): 500 mg via ORAL
  Filled 2017-02-06 (×2): qty 1

## 2017-02-06 NOTE — BHH Suicide Risk Assessment (Signed)
Suicide Risk Assessment  Discharge Assessment   [facility] Discharge Suicide Risk Assessment   Principal Problem: Substance induced mood disorder [facility]) Discharge Diagnoses:  Patient Active Problem List   Diagnosis Date Noted  . Substance induced mood disorder (HCC) [F19.94] 09/08/2016    Priority: High  . Bipolar affective disorder, current episode hypomanic (HCC) [F31.0] 07/21/2016    Priority: High  . Intentional drug overdose (HCC) [T50.902A] 12/05/2016  . Acute encephalopathy [G93.40] 12/05/2016  . Sinus tachycardia [R00.0] 12/05/2016  . Hypotension [I95.9] 12/05/2016  . Overdose, intentional self-harm, initial encounter (HCC) [T50.902A] 11/20/2016  . Intentional SSRI (selective serotonin reuptake inhibitor) overdose (HCC) [T43.222A] 11/20/2016  . Homelessness [Z59.0] 09/02/2016  . Attention deficit hyperactivity disorder (ADHD) [F90.9] 07/03/2016  . 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 Tobacco use disorder [F17.200] 07/03/2016  . Unspecified depressive  disorder [F32.9] 07/03/2016  . 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 Dextromethorphan overdose [T48.3X1A] 06/03/2016  . Leukocytosis [D72.829]   . Cannabis use disorder, moderate, dependence (HCC) [F12.20] 12/04/2012    Total Time spent with patient: 45 minutes  Musculoskeletal: Strength & Muscle Tone: within normal limits Gait & Station: normal Patient leans: N/A  Psychiatric Specialty Exam:   Blood pressure (!) 105/49, pulse 77, temperature 98.1 F (36.7 C), temperature source Oral, resp. rate 16, height  (1.651 m), weight 76.2 kg (168 lb), SpO2 100 %.Body mass index is 27.96 kg/m.  General Appearance: Casual  Eye Contact::  Good  Speech:  Normal Rate409  Volume:  Normal  Mood:  Anxious, mild  Affect:  Congruent  Thought Process:  Coherent and Descriptions of Associations: Intact  Orientation:  Full (Time, Place, and Person)  Thought Content:  WDL and Logical  Suicidal  Thoughts:  No  Homicidal Thoughts:  No  Memory:  Immediate;   Good Recent;   Good Remote;   Good  Judgement:  Fair  Insight:  Fair  Psychomotor Activity:  Normal  Concentration:  Good  Recall:  Good  Fund of Knowledge:  Fair  Language: Good  Akathisia:  No  Handed:  Right  AIMS (if indicated):     Assets:  Leisure Time Physical Health Resilience  Sleep:     Cognition: WNL  ADL's:  Intact   Mental Status Per Nursing Assessment::   On Admission:    Seeing "scary stuff" after using nutmeg.  He is scheduled to go to rehab on Wednesday but is homeless, his big stressor.  [facility] team will be notified for transportation issues and coordination of care.  No suicidal/homicidal ideations, hallucinations, and withdrawal symptoms  Demographic Factors:  Male, Adolescent or young adult and Caucasian  Loss Factors: NA  Historical Factors: NA  Risk Reduction Factors:   Sense of responsibility to family, Positive social support and Positive therapeutic relationship  Continued Clinical Symptoms:  Anxiety, mild  Cognitive Features That Contribute To Risk:  None    Suicide Risk:  Minimal: No identifiable suicidal ideation.  Patients presenting with no risk factors but with morbid ruminations; may be classified as minimal risk based on the severity of the depressive symptoms    Plan Of Care/Follow-up recommendations:  Activity:  as tolerated Diet:  heart healthy diet  Fredie Majano, NP 02/06/2017, 10:14 AM

## 2017-02-06 NOTE — Discharge Instructions (Signed)
For your ongoing behavioral health needs, you are advised to continue treatment with the [facility]:       [facility]      [facility], [address]      [address]      [place], [place]  [address]      [phone]  You have indicated that you will be going to [facility] on Thursday (02/08/2017) of this week.  You are advised to follow through on this plan:       [facility]      [address] [place], [place] [address]      [phone]

## 2017-02-06 NOTE — Progress Notes (Signed)
CSW contacted Debbie with [facility] to notify patient is being discharged from the ED. Eunice Blase stated someone who arrive to the hospital within the hour to pick patient up.   Stacy Gardner, LCSWA Clinical Social Worker [phone]

## 2017-02-06 NOTE — BH Assessment (Signed)
BHH Assessment Progress Note  Per Thedore Mins, MD, this pt does not require psychiatric hospitalization at this time.  Pt is to be discharged from [facility] with recommendation to follow up with the [facility], his outpatient provider, and with [facility].  This has been included in pt's discharge instructions.  Pt's nurse, Aram Beecham, has been notified.  Doylene Canning, MA Triage Specialist [phone]

## 2017-02-06 NOTE — BH Assessment (Addendum)
Tele Assessment Note   Thomas [name] is an 21 y.o. male, who presents voluntary and unaccompanied to [facility]. Pt has been to [facility] once on 02/02/2017 and 02/03/2017, to get a prescription for Lithium and because he was trying to sleep in his mother shed and the wind began to talk to him. Pt was poor historian during the assessment and had to be redirected numerous times to stay wake and engage in the assessment. Pt reported, coming to [facility] because he smoked marijuana that made him dizzy. Pt reported, eating three nutmeg nuts because he heard it helps when quitting smoking marijuana. Pt reported, "I feel like I want to die." Pt reported, seeing scary stuff. Pt denied, SI, HI, AVH and self-injurious behaviors.   Clinician was unableto the assess the following: history of abuse, orientation, consent for treatment, and contracting for safety. Pt reported, smoking a blunt of marijuana, yesterday (02/05/2017). Pt's UDS was negative. Pt reported, he doesn't like his ACT Team. Pt reported, going to [facility] to a detox program on Wednesday 02/07/2017. Per pt's chart he has had previous inpatient admissions.   Pt presented sleeping in scrubs with slow, slurred, circumstantial speech. Pt's eye contact was poor. Pt's mood was tired. Pt's affect was flat. Pt's thought process was circumstantial. Pt's judgement was unimpaired. Pt's concentration, insight and impulse control are poor.   Diagnosis: Bi-polar 1 Disorder (HCC)  Past Medical History:  Past Medical History:  Diagnosis Date  . ADHD (attention deficit hyperactivity disorder)   . Anxiety   . Bipolar 1 disorder (HCC)   . Current smoker   . Depression   . Eating disorder   . Headache(784.0)   . History of ADHD 11/01/2015  . Medical history non-contributory   . Mental disorder   . Nicotine dependence 11/03/2015  . 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 No pertinent surgical history.  Family History: No  family history on file.  Social History:  reports that he has been smoking Cigarettes.  He has been smoking about 0.00 packs per day for the past 4.00 years. He has never used smokeless tobacco. He reports that he uses drugs, including Marijuana, Cocaine, LSD, and MDMA (Ecstacy). He reports that he does not drink alcohol.  Additional Social History:  Alcohol / Drug Use Pain Medications: See MAR Prescriptions: See MAR Over the Counter: See MAR History of alcohol / drug use?: No history of alcohol / drug abuse  CIWA: CIWA-Ar BP: (!) 106/47 Pulse Rate: 88 COWS:    PATIENT STRENGTHS: (choose at least two) Average or above average intelligence General fund of knowledge  Allergies: No Known Allergies  Home Medications:  (Not in a hospital admission)  OB/GYN Status:  No LMP for male patient.  General Assessment Data Location of Assessment: [facility] TTS Assessment: In system Is this a Tele or Face-to-Face Assessment?: Face-to-Face Is this an Initial Assessment or a Re-assessment for this encounter?: Initial Assessment Marital status: Single Living Arrangements: Other (Comment) (Homeless) Can pt return to current living arrangement?: Yes Admission Status: Voluntary Is patient capable of signing voluntary admission?: Yes Referral Source: Self/Family/Friend Insurance type: [facility]     Crisis Care Plan Living Arrangements: Other (Comment) (Homeless) Legal Guardian: Other: (Self) Name of Psychiatrist: [facility] Name of Therapist: [facility]  Education Status Is patient currently in school?: No Current Grade: NA Highest grade of school patient has completed: GED Name of school: NA Contact person: NA  Risk to self with  the past 6 months Suicidal Ideation: No-Not Currently/Within Last 6 Months (Pt denies, then said he felt like he wanted to die, today. ) Has patient been a risk to self within the past 6 months prior to admission? : Yes Suicidal Intent: No Has  patient had any suicidal intent within the past 6 months prior to admission? : Yes Is patient at risk for suicide?: No Suicidal Plan?: No Has patient had any suicidal plan within the past 6 months prior to admission? : Yes Specify Current Suicidal Plan: Pt denies. Access to Means:  (UTA) Specify Access to Suicidal Means: UTA What has been your use of drugs/alcohol within the last 12 months?: negative Previous Attempts/Gestures: Yes How many times?:  (Multiple) Other Self Harm Risks: Pt denies.  Triggers for Past Attempts: Hallucinations, Unpredictable Intentional Self Injurious Behavior: None (Pt denies. ) Comment - Self Injurious Behavior: NA Family Suicide History: Yes Recent stressful life event(s): Other (Comment) (Homelessness) Persecutory voices/beliefs?: No Depression: No Depression Symptoms:  (NA) Substance abuse history and/or treatment for substance abuse?: Yes Suicide prevention information given to non-admitted patients: Not applicable  Risk to Others within the past 6 months Homicidal Ideation: No (Pt denies. ) Does patient have any lifetime risk of violence toward others beyond the six months prior to admission? : No Thoughts of Harm to Others: No Comment - Thoughts of Harm to Others: NA Current Homicidal Intent: No Current Homicidal Plan: No Access to Homicidal Means: No Identified Victim: NA History of harm to others?: No Assessment of Violence: None Noted Violent Behavior Description: NA Does patient have access to weapons?: No (Pt denies. ) Criminal Charges Pending?: Yes Describe Pending Criminal Charges: Per chart; Breaking and Entering and larceny after breaking and entering.  Does patient have a court date: Yes Court Date: 02/05/17 Is patient on probation?: Yes  Psychosis Hallucinations: Visual Delusions: None noted  Mental Status Report Appearance/Hygiene: In scrubs Eye Contact: Poor Motor Activity: Unremarkable Speech: Slow, Slurred, Other  (Comment) (circumstantal) Level of Consciousness: Sleeping Mood: Other (Comment) (tired) Affect: Flat Anxiety Level: None Thought Processes: Circumstantial Judgement: Unimpaired Orientation: Unable to assess Obsessive Compulsive Thoughts/Behaviors: Unable to Assess  Cognitive Functioning Concentration: Poor Memory: Unable to Assess IQ: Average Insight: Poor Impulse Control: Poor Appetite:  (UTA) Weight Loss:  (UTA) Weight Gain:  (UTA) Sleep: Decreased Total Hours of Sleep:  (Pt reported, he hasn't slept since yesterday. ) Vegetative Symptoms: Unable to Assess  ADLScreening [facility] Assessment Services) Patient's cognitive ability adequate to safely complete daily activities?: Yes Patient able to express need for assistance with ADLs?: Yes Independently performs ADLs?: Yes (appropriate for developmental age)  Prior Inpatient Therapy Prior Inpatient Therapy: Yes Prior Therapy Dates: Multiple; 8 admits in last 12 months Prior Therapy Facilty/Provider(s): [facility], [facility], [facility], others Reason for Treatment: Bipolar Affective Disorder  Prior Outpatient Therapy Prior Outpatient Therapy: Yes Prior Therapy Dates: Current Prior Therapy Facilty/Provider(s): [facility] Reason for Treatment: Medication management and counseling. Does patient have an ACCT team?: Yes Does patient have Intensive In-House Services?  : No Does patient have [facility] services? : No Does patient have P4CC services?: No  ADL Screening (condition at time of admission) Patient's cognitive ability adequate to safely complete daily activities?: Yes Is the patient deaf or have difficulty hearing?: No Does the patient have difficulty seeing, even when wearing glasses/contacts?: No Does the patient have difficulty concentrating, remembering, or making decisions?: Yes Patient able to express need for assistance with ADLs?: Yes Does the patient have difficulty dressing or  bathing?: No Independently performs ADLs?: Yes  (appropriate for developmental age) Does the patient have difficulty walking or climbing stairs?: No Weakness of Legs: None Weakness of Arms/Hands: None       Abuse/Neglect Assessment (Assessment to be complete while patient is alone) Physical Abuse:  (UTA) Verbal Abuse:  (UTA) Sexual Abuse:  (UTA) Exploitation of patient/patient's resources:  (UTA) Self-Neglect:  (UTA)     Advance Directives (For Healthcare) Does Patient Have a Medical Advance Directive?: No Would patient like information on creating a medical advance directive?: No - Patient declined    Additional Information 1:1 In Past 12 Months?: No CIRT Risk: No Elopement Risk: No Does patient have medical clearance?: Yes     Disposition: Donell Sievert, PA recommends AM Psychiatric Evaluation. Disposition discussed with Dahlia Client, PA and Consuella Lose, RN.  Disposition Initial Assessment Completed for this Encounter: Yes Disposition of Patient: Other dispositions (AM Psychiatric Evaluation) Other disposition(s): Other (Comment) (AM Psychiatric Evaluation. )  Gwinda Passe 02/06/2017 3:43 AM   Gwinda Passe, MS, [facility], [facility] Triage Specialist [phone]

## 2017-02-11 ENCOUNTER — Encounter (HOSPITAL_COMMUNITY): Payer: Self-pay | Admitting: Emergency Medicine

## 2017-02-11 ENCOUNTER — Emergency Department (HOSPITAL_COMMUNITY)
Admission: EM | Admit: 2017-02-11 | Discharge: 2017-02-12 | Disposition: A | Discharge status: Home / Self Care | Payer: Medicaid Other | Attending: Emergency Medicine | Admitting: Emergency Medicine

## 2017-02-11 DIAGNOSIS — Y9389 Activity, other specified: Secondary | ICD-10-CM | POA: Insufficient documentation

## 2017-02-11 DIAGNOSIS — M542 Cervicalgia: Secondary | ICD-10-CM

## 2017-02-11 DIAGNOSIS — Y929 Unspecified place or not applicable: Secondary | ICD-10-CM | POA: Insufficient documentation

## 2017-02-11 DIAGNOSIS — X509XXA Other and unspecified overexertion or strenuous movements or postures, initial encounter: Secondary | ICD-10-CM | POA: Insufficient documentation

## 2017-02-11 DIAGNOSIS — Y999 Unspecified external cause status: Secondary | ICD-10-CM | POA: Insufficient documentation

## 2017-02-11 DIAGNOSIS — F909 Attention-deficit hyperactivity disorder, unspecified type: Secondary | ICD-10-CM | POA: Insufficient documentation

## 2017-02-11 DIAGNOSIS — F17210 Nicotine dependence, cigarettes, uncomplicated: Secondary | ICD-10-CM | POA: Diagnosis not present

## 2017-02-11 NOTE — ED Triage Notes (Addendum)
Pt transported from [facility] (homeless shelter) with c/o neck stiffness/pain. Pt states pain began after hearing "pop" while doing machine workout 3 days ago.  Pt states the redness to face, neck and hands are d/t pain in neck. Pt denies numbness/tingling. Denies urinary/bowel incontinence.  Pt appears very drowsy in triage.

## 2017-02-12 MED ORDER — ACETAMINOPHEN 325 MG PO TABS: 650.0000 mg | ORAL_TABLET | Freq: Once | ORAL | Status: DC

## 2017-02-12 NOTE — ED Notes (Signed)
Pt states, "I'm leaving cuz he ain't gonna do anything to help me."  "Can I get a bus pass?"  Asked pt to inquire about bus pass in lobby.  Will inform Dr. Bebe Shaggy.

## 2017-02-12 NOTE — ED Notes (Signed)
Pt left prior to d/c instructions, VS or e-signing.

## 2017-02-12 NOTE — ED Notes (Signed)
Pt ambulating independently from room to bathroom w/ a steady gait.

## 2017-02-12 NOTE — ED Provider Notes (Signed)
[facility]-EMERGENCY DEPT Provider Note   CSN: [id number] Arrival date & time: 02/11/17  2332   By signing my name below, I, Clovis Pu, attest that this documentation has been prepared under the direction and in the presence of Zadie Rhine, MD  Electronically Signed: Clovis Pu, ED Scribe. 02/12/17. 1:59 AM.   History   Chief Complaint Chief Complaint  Patient presents with  . Neck Injury    HPI Comments:  Thomas [name] is a 21 y.o. male who presents to the Emergency Department complaining of acute onset, moderate right sided neck pain s/p an incident which occurred 3 days ago. Pt states he pulled a muscle while exercising. His pain is worse with palpation. He also reports generalized weakness. No alleviating factors noted. Pt denies fevers, vomiting, chest pain, SOB or any other associated symptoms. Pt also denies dropping anything on his neck. No other complaints noted at this time. No trauma reported  The history is provided by the patient. No language interpreter was used.  Neck Injury  This is a new problem. The current episode started more than 2 days ago. The problem occurs constantly. The problem has not changed since onset.Pertinent negatives include no chest pain and no shortness of breath. Exacerbated by: movement and palpation  Nothing relieves the symptoms. He has tried nothing for the symptoms.    Past Medical History:  Diagnosis Date  . ADHD (attention deficit hyperactivity disorder)   . Anxiety   . Bipolar 1 disorder (HCC)   . Current smoker   . Depression   . Eating disorder   . Headache(784.0)   . History of ADHD 11/01/2015  . Medical history non-contributory   . Mental disorder   . Nicotine dependence 11/03/2015  . 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 Intentional drug overdose (HCC) 12/05/2016  . Acute encephalopathy 12/05/2016  . Sinus tachycardia 12/05/2016  .  Hypotension 12/05/2016  . Overdose, intentional self-harm, initial encounter (HCC) 11/20/2016  . Intentional SSRI (selective serotonin reuptake inhibitor) overdose (HCC) 11/20/2016  . Substance induced mood disorder (HCC) 09/08/2016  . Homelessness 09/02/2016  . Bipolar affective disorder, current episode hypomanic (HCC) 07/21/2016  . Attention deficit hyperactivity disorder (ADHD) 07/03/2016  . 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 Tobacco use disorder 07/03/2016  . Unspecified depressive  disorder 07/03/2016  . 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 Dextromethorphan overdose 06/03/2016  . Leukocytosis   . Cannabis use disorder, moderate, dependence (HCC) 12/04/2012    History reviewed. No pertinent surgical history.   Home Medications    Prior to Admission medications   Medication Sig Start Date End Date Taking? Authorizing Provider  divalproex (DEPAKOTE) 500 MG DR tablet Take 500 mg by mouth 2 (two) times daily. 01/29/17   Historical Provider, MD  risperiDONE (RISPERDAL) 1 MG tablet Take 1 mg by mouth 2 (two) times daily. 01/29/17 02/28/17  Historical Provider, MD    Family History No family history on file.  Social History Social History  Substance Use Topics  . Smoking status: Current Some Day Smoker    Packs/day: 0.00    Years: 4.00    Types: Cigarettes  . Smokeless tobacco: Never Used  . Alcohol use No     Allergies   Patient has no known allergies.   Review of Systems Review of Systems  Constitutional: Negative for fever.  Respiratory: Negative for shortness of breath.   Cardiovascular: Negative for chest  pain.  Gastrointestinal: Negative for vomiting.  Musculoskeletal: Positive for neck pain.  Neurological: Negative for weakness.  All other systems reviewed and are negative.   Physical Exam Updated Vital Signs BP 119/62   Pulse 80   Temp 98.4 F (36.9 C) (Oral)   Resp 16   Ht  (1.676 m)   SpO2 98%   Physical  Exam CONSTITUTIONAL: sleeping but arousable, no distress noted HEAD: Normocephalic/atraumatic EYES: EOMI/PERRL ENMT: Mucous membranes moist NECK: supple no meningeal signs, mild tenderness to right cervical paraspinal region. No bruits, no bruising noted to neck.  SPINE/BACK:entire spine nontender CV: S1/S2 noted, no murmurs/rubs/gallops noted LUNGS: Lungs are clear to auscultation bilaterally, no apparent distress ABDOMEN: soft, nontender GU:no cva tenderness NEURO: Pt is awake/alert/appropriate, moves all extremitiesx4.  No facial droop. No focal arm weakness noted. Equal power in both extremities. EXTREMITIES: pulses normal/equal, full ROM SKIN: warm, color normal PSYCH: no abnormalities of mood noted, alert and oriented to situation  ED Treatments / Results  DIAGNOSTIC STUDIES:  Oxygen Saturation is 98% on RA, normal by my interpretation.    COORDINATION OF CARE:  1:55 AM Discussed treatment plan with pt at bedside and pt agreed to plan.  Labs (all labs ordered are listed, but only abnormal results are displayed) Labs Reviewed - No data to display  EKG  EKG Interpretation None       Radiology No results found.  Procedures Procedures (including critical care time)  Medications Ordered in ED Medications - No data to display   Initial Impression / Assessment and Plan / ED Course  I have reviewed the triage vital signs and the nursing notes.   Pt decided to leave prior to my d/c paperwork given to patient He was awake/alert, ambulatory and no focal neuro deficits   Final Clinical Impressions(s) / ED Diagnoses   Final diagnoses:  Neck pain    New Prescriptions New Prescriptions   No medications on file  I personally performed the services described in this documentation, which was scribed in my presence. The recorded information has been reviewed and is accurate.       Zadie Rhine, MD 02/12/17 [id number]

## 2017-02-13 NOTE — ED Provider Notes (Signed)
[facility]PT Provider Note   CSN: [id number] Arrival date & time: 02/03/17  0135     History   Chief Complaint Chief Complaint  Patient presents with  . Medical Clearance    HPI Thomas [name] is a 21 y.o. male.  Patient returns to the ED for evaluation of auditory hallucinations. He reports the wind is speaking to him. He denies command voices. He was seen and evaluated by TTS earlier today and found appropriate for discharge home. He states he went to his mother's house and tried to sleep in her shed when symptoms started. No SI/HI.   The history is provided by the patient. No language interpreter was used.    Past Medical History:  Diagnosis Date  . ADHD (attention deficit hyperactivity disorder)   . Anxiety   . Bipolar 1 disorder (HCC)   . Current smoker   . Depression   . Eating disorder   . Headache(784.0)   . History of ADHD 11/01/2015  . Medical history non-contributory   . Mental disorder   . Nicotine dependence 11/03/2015  . 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 Intentional drug overdose (HCC) 12/05/2016  . Acute encephalopathy 12/05/2016  . Sinus tachycardia 12/05/2016  . Hypotension 12/05/2016  . Overdose, intentional self-harm, initial encounter (HCC) 11/20/2016  . Intentional SSRI (selective serotonin reuptake inhibitor) overdose (HCC) 11/20/2016  . Substance induced mood disorder (HCC) 09/08/2016  . Homelessness 09/02/2016  . Bipolar affective disorder, current episode hypomanic (HCC) 07/21/2016  . Attention deficit hyperactivity disorder (ADHD) 07/03/2016  . 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 Tobacco use disorder 07/03/2016  . Unspecified depressive  disorder 07/03/2016  . 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 Dextromethorphan overdose 06/03/2016  . Leukocytosis   . Cannabis use disorder, moderate, dependence (HCC) 12/04/2012     History reviewed. No pertinent surgical history.     Home Medications    Prior to Admission medications   Medication Sig Start Date End Date Taking? Authorizing Provider  divalproex (DEPAKOTE) 500 MG DR tablet Take 500 mg by mouth 2 (two) times daily. 01/29/17  Yes Historical Provider, MD  risperiDONE (RISPERDAL) 1 MG tablet Take 1 mg by mouth 2 (two) times daily. 01/29/17 02/28/17 Yes Historical Provider, MD    Family History No family history on file.  Social History Social History  Substance Use Topics  . Smoking status: Current Some Day Smoker    Packs/day: 0.00    Years: 4.00    Types: Cigarettes  . Smokeless tobacco: Never Used  . Alcohol use No     Allergies   Patient has no known allergies.   Review of Systems Review of Systems  Constitutional: Negative for chills and fever.  HENT: Negative.   Respiratory: Negative.   Cardiovascular: Negative.   Gastrointestinal: Negative.   Musculoskeletal: Negative.   Skin: Negative.   Neurological: Negative.   Psychiatric/Behavioral: Positive for hallucinations. Negative for suicidal ideas.     Physical Exam Updated Vital Signs BP 133/88 (BP Location: Right Arm)   Pulse 73   Temp 97.8 F (36.6 C) (Oral)   Resp 15   SpO2 98%   Physical Exam  Constitutional: He is oriented to person, place, and time. He appears well-developed and well-nourished.  Neck: Normal range of motion.  Cardiovascular: Normal rate.   Pulmonary/Chest: Effort normal. No respiratory distress.  Musculoskeletal: Normal range of motion.  Neurological: He is alert  and oriented to person, place, and time.  Skin: Skin is warm and dry.     ED Treatments / Results  Labs (all labs ordered are listed, but only abnormal results are displayed) Labs Reviewed - No data to display  EKG  EKG Interpretation None       Radiology No results found.  Procedures Procedures (including critical care time)  Medications Ordered in  ED Medications - No data to display   Initial Impression / Assessment and Plan / ED Course  I have reviewed the triage vital signs and the nursing notes.  Pertinent labs & imaging results that were available during my care of the patient were reviewed by me and considered in my medical decision making (see chart for details).     The patient returns to the emergency department stating the wind is talking to him. He denies SI/HI. No command voices. He has been seen by TTS and deemed appropriate for discharge home earlier today. He can go home and follow up with resources provided.   Final Clinical Impressions(s) / ED Diagnoses   Final diagnoses:  Homelessness    New Prescriptions Discharge Medication List as of 02/03/2017  4:06 AM       Elpidio Anis, PA-C 02/13/17 1620    April Palumbo, MD 02/13/17 2348

## 2017-02-18 ENCOUNTER — Encounter (HOSPITAL_COMMUNITY): Payer: Self-pay

## 2017-02-18 ENCOUNTER — Emergency Department (HOSPITAL_COMMUNITY)
Admission: EM | Admit: 2017-02-18 | Discharge: 2017-02-18 | Discharge status: Against Medical Advice | Payer: Medicaid Other | Attending: Emergency Medicine | Admitting: Emergency Medicine

## 2017-02-18 DIAGNOSIS — F909 Attention-deficit hyperactivity disorder, unspecified type: Secondary | ICD-10-CM | POA: Insufficient documentation

## 2017-02-18 DIAGNOSIS — R1084 Generalized abdominal pain: Secondary | ICD-10-CM | POA: Diagnosis present

## 2017-02-18 DIAGNOSIS — F17210 Nicotine dependence, cigarettes, uncomplicated: Secondary | ICD-10-CM | POA: Insufficient documentation

## 2017-02-18 DIAGNOSIS — R109 Unspecified abdominal pain: Secondary | ICD-10-CM

## 2017-02-18 LAB — COMPREHENSIVE METABOLIC PANEL
ALBUMIN: 4.8 g/dL (ref 3.5–5.0)
ALT: 20 U/L (ref 17–63)
ANION GAP: 8 (ref 5–15)
AST: 20 U/L (ref 15–41)
Alkaline Phosphatase: 67 U/L (ref 38–126)
BUN: 13 mg/dL (ref 6–20)
CHLORIDE: 103 mmol/L (ref 101–111)
CO2: 27 mmol/L (ref 22–32)
Calcium: 9.5 mg/dL (ref 8.9–10.3)
Creatinine, Ser: 0.95 mg/dL (ref 0.61–1.24)
GFR calc non Af Amer: >60 mL/min (ref >60)
GLUCOSE: 114 mg/dL — AB (ref 65–99)
Potassium: 3.8 mmol/L (ref 3.5–5.1)
SODIUM: 138 mmol/L (ref 135–145)
Total Bilirubin: 0.6 mg/dL (ref 0.3–1.2)
Total Protein: 7.7 g/dL (ref 6.5–8.1)

## 2017-02-18 LAB — CBC
HEMATOCRIT: 45.5 % (ref 39.0–52.0)
Hemoglobin: 15.5 g/dL (ref 13.0–17.0)
MCH: 31.6 pg (ref 26.0–34.0)
MCHC: 34.1 g/dL (ref 30.0–36.0)
MCV: 92.7 fL (ref 78.0–100.0)
Platelets: 245 10*3/uL (ref 150–400)
RBC: 4.91 MIL/uL (ref 4.22–5.81)
RDW: 13 % (ref 11.5–15.5)
WBC: 9.5 10*3/uL (ref 4.0–10.5)

## 2017-02-18 LAB — LIPASE, BLOOD: LIPASE: 42 U/L (ref 11–51)

## 2017-02-18 MED ORDER — KETOROLAC TROMETHAMINE 60 MG/2ML IM SOLN
60.0000 mg | Freq: Once | INTRAMUSCULAR | Status: AC
  Administered 2017-02-18: 60 mg via INTRAMUSCULAR
  Filled 2017-02-18: qty 2

## 2017-02-18 NOTE — ED Notes (Signed)
Per EMS- Abd pain and nausea since yesterday. Vomited once yesterday. States he thinks he ate some bad pork. Currently homeless.

## 2017-02-18 NOTE — Progress Notes (Signed)
Patient stated "he wants to leave now because this place is getting on my nerves". RN explain about doing AMA but still wants to go home. M.D. Informed

## 2017-02-18 NOTE — ED Provider Notes (Signed)
[facility]-EMERGENCY DEPT Provider Note   CSN: [id number] Arrival date & time: 02/18/17  0147     History   Chief Complaint Chief Complaint  Patient presents with  . Abdominal Pain    HPI Thomas [name] is a 21 y.o. male.  He is complaining of generalized abdominal pain. He refuses to tell me how long pain has been present, but states that it was since he was diagnosed with kidney stones. He will not tell me where he was diagnosed with kidney stones, stating that it is not of my business. He he will not tell me if he has taken any medication home. He denies fever, chills, sweats. He denies nausea, vomiting, diarrhea. He denies urinary urgency, frequency, tenesmus, dysuria. He rates his pain at 9/10.   The history is provided by the patient.    Past Medical History:  Diagnosis Date  . ADHD (attention deficit hyperactivity disorder)   . Anxiety   . Bipolar 1 disorder (HCC)   . Current smoker   . Depression   . Eating disorder   . Headache(784.0)   . History of ADHD 11/01/2015  . Medical history non-contributory   . Mental disorder   . Nicotine dependence 11/03/2015  . 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 Intentional drug overdose (HCC) 12/05/2016  . Acute encephalopathy 12/05/2016  . Sinus tachycardia 12/05/2016  . Hypotension 12/05/2016  . Overdose, intentional self-harm, initial encounter (HCC) 11/20/2016  . Intentional SSRI (selective serotonin reuptake inhibitor) overdose (HCC) 11/20/2016  . Substance induced mood disorder (HCC) 09/08/2016  . Homelessness 09/02/2016  . Bipolar affective disorder, current episode hypomanic (HCC) 07/21/2016  . Attention deficit hyperactivity disorder (ADHD) 07/03/2016  . 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 Tobacco use disorder 07/03/2016  . Unspecified depressive  disorder 07/03/2016  . 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  Dextromethorphan overdose 06/03/2016  . Leukocytosis   . Cannabis use disorder, moderate, dependence (HCC) 12/04/2012    History reviewed. No pertinent surgical history.     Home Medications    Prior to Admission medications   Medication Sig Start Date End Date Taking? Authorizing Provider  divalproex (DEPAKOTE) 500 MG DR tablet Take 500 mg by mouth 2 (two) times daily. 01/29/17  Yes [provider]  risperiDONE (RISPERDAL) 1 MG tablet Take 1 mg by mouth 2 (two) times daily. 01/29/17 02/28/17 Yes [provider]    Family History No family history on file.  Social History Social History  Substance Use Topics  . Smoking status: Current Some Day Smoker    Packs/day: 0.00    Years: 4.00    Types: Cigarettes  . Smokeless tobacco: Never Used  . Alcohol use No     Allergies   Patient has no known allergies.   Review of Systems Review of Systems  All other systems reviewed and are negative.    Physical Exam Updated Vital Signs BP 105/74 (BP Location: Right Arm)   Pulse 80   Temp 97.4 F (36.3 C) (Oral)   Resp 16   SpO2 99%   Physical Exam  Nursing note and vitals reviewed.  21 year old male, resting comfortably and in no acute distress. Vital signs are normal. Oxygen saturation is 99%, which is normal. He is sleeping soundly when I initially come in to evaluate him, and he quickly falls back to sleep well once I am finished. Head is normocephalic and  atraumatic. PERRLA, EOMI. Oropharynx is clear. Neck is nontender and supple without adenopathy or JVD. Back is nontender and there is no CVA tenderness. Lungs are clear without rales, wheezes, or rhonchi. Chest is nontender. Heart has regular rate and rhythm without murmur. Abdomen is soft, flat, with mild tenderness diffusely. There are no masses or hepatosplenomegaly and peristalsis is hypoactive. Extremities have no cyanosis or edema, full range of motion is present. Skin is warm and dry without  rash. Neurologic: Mental status is normal, cranial nerves are intact, there are no motor or sensory deficits.  ED Treatments / Results  Labs (all labs ordered are listed, but only abnormal results are displayed) Labs Reviewed  COMPREHENSIVE METABOLIC PANEL - Abnormal; Notable for the following:       Result Value   Glucose, Bld 114 (*)    All other components within normal limits  LIPASE, BLOOD  CBC   Procedures Procedures (including critical care time)  Medications Ordered in ED Medications  ketorolac (TORADOL) injection 60 mg (60 mg Intramuscular Given 02/18/17 0509)     Initial Impression / Assessment and Plan / ED Course  I have reviewed the triage vital signs and the nursing notes.  Pertinent labs & imaging results that were available during my care of the patient were reviewed by me and considered in my medical decision making (see chart for details).  Abdominal pain with generally benign exam. Old records are reviewed, and it is noted that he is treated for bipolar disorder but I see no prior visits for kidney stones or abdominal pain. I see no prior imaging of the abdomen. Urinalysis is pending and will add urine drug screen. I reviewed his record on the [place] controlled substance reporting system and it shows no narcotic prescriptions in the last 6 months. He is given a dose of ketorolac.  Patient left AGAINST MEDICAL ADVICE without providing a urine sample.  Final Clinical Impressions(s) / ED Diagnoses   Final diagnoses:  Abdominal pain, unspecified abdominal location    New Prescriptions New Prescriptions   No medications on file     Dione Booze, MD 02/18/17 [phone]

## 2017-03-10 ENCOUNTER — Emergency Department (HOSPITAL_COMMUNITY)
Admission: EM | Admit: 2017-03-10 | Discharge: 2017-03-10 | Discharge status: Against Medical Advice | Payer: Medicaid Other | Attending: Dermatology | Admitting: Dermatology

## 2017-03-10 ENCOUNTER — Encounter (HOSPITAL_COMMUNITY): Payer: Self-pay

## 2017-03-10 DIAGNOSIS — S0511XA Contusion of eyeball and orbital tissues, right eye, initial encounter: Secondary | ICD-10-CM | POA: Insufficient documentation

## 2017-03-10 DIAGNOSIS — Y9389 Activity, other specified: Secondary | ICD-10-CM | POA: Diagnosis not present

## 2017-03-10 DIAGNOSIS — Y92512 Supermarket, store or market as the place of occurrence of the external cause: Secondary | ICD-10-CM | POA: Diagnosis not present

## 2017-03-10 DIAGNOSIS — Y999 Unspecified external cause status: Secondary | ICD-10-CM | POA: Insufficient documentation

## 2017-03-10 NOTE — ED Notes (Signed)
Patient given a lunch tray and a bus pass

## 2017-03-10 NOTE — ED Notes (Signed)
He did speak [place] at some length while he was here. He decided to leave; and did sign the requisite form.

## 2017-03-10 NOTE — ED Notes (Signed)
Bed: [facility] Expected date:  Expected time:  Means of arrival:  Comments: 
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
## 2017-03-17 ENCOUNTER — Encounter (HOSPITAL_COMMUNITY): Payer: Self-pay | Admitting: Emergency Medicine

## 2017-03-17 ENCOUNTER — Emergency Department (HOSPITAL_COMMUNITY): Payer: Medicaid Other

## 2017-03-17 ENCOUNTER — Emergency Department (HOSPITAL_COMMUNITY)
Admission: EM | Admit: 2017-03-17 | Discharge: 2017-03-18 | Disposition: A | Discharge status: Home / Self Care | Payer: Medicaid Other | Attending: Emergency Medicine | Admitting: Emergency Medicine

## 2017-03-17 DIAGNOSIS — F1914 Other psychoactive substance abuse with psychoactive substance-induced mood disorder: Secondary | ICD-10-CM | POA: Insufficient documentation

## 2017-03-17 DIAGNOSIS — R45851 Suicidal ideations: Secondary | ICD-10-CM

## 2017-03-17 DIAGNOSIS — F1290 Cannabis use, unspecified, uncomplicated: Secondary | ICD-10-CM | POA: Diagnosis not present

## 2017-03-17 DIAGNOSIS — F319 Bipolar disorder, unspecified: Secondary | ICD-10-CM | POA: Diagnosis present

## 2017-03-17 DIAGNOSIS — F1994 Other psychoactive substance use, unspecified with psychoactive substance-induced mood disorder: Secondary | ICD-10-CM | POA: Diagnosis not present

## 2017-03-17 DIAGNOSIS — F909 Attention-deficit hyperactivity disorder, unspecified type: Secondary | ICD-10-CM | POA: Diagnosis not present

## 2017-03-17 DIAGNOSIS — F1910 Other psychoactive substance abuse, uncomplicated: Secondary | ICD-10-CM

## 2017-03-17 DIAGNOSIS — F3170 Bipolar disorder, currently in remission, most recent episode unspecified: Secondary | ICD-10-CM | POA: Diagnosis not present

## 2017-03-17 DIAGNOSIS — F17210 Nicotine dependence, cigarettes, uncomplicated: Secondary | ICD-10-CM | POA: Diagnosis not present

## 2017-03-17 LAB — RAPID URINE DRUG SCREEN, HOSP PERFORMED
AMPHETAMINES: NOT DETECTED
Barbiturates: NOT DETECTED
Benzodiazepines: NOT DETECTED
COCAINE: POSITIVE — AB
Opiates: NOT DETECTED
Tetrahydrocannabinol: POSITIVE — AB

## 2017-03-17 LAB — CBC
HCT: 41.6 % (ref 39.0–52.0)
Hemoglobin: 14.7 g/dL (ref 13.0–17.0)
MCH: 32 pg (ref 26.0–34.0)
MCHC: 35.3 g/dL (ref 30.0–36.0)
MCV: 90.6 fL (ref 78.0–100.0)
Platelets: 209 10*3/uL (ref 150–400)
RBC: 4.59 MIL/uL (ref 4.22–5.81)
RDW: 13.6 % (ref 11.5–15.5)
WBC: 8.4 10*3/uL (ref 4.0–10.5)

## 2017-03-17 LAB — ACETAMINOPHEN LEVEL

## 2017-03-17 LAB — COMPREHENSIVE METABOLIC PANEL
ALBUMIN: 4.1 g/dL (ref 3.5–5.0)
ALT: 158 U/L — ABNORMAL HIGH (ref 17–63)
ANION GAP: 8 (ref 5–15)
AST: 44 U/L — ABNORMAL HIGH (ref 15–41)
Alkaline Phosphatase: 70 U/L (ref 38–126)
BILIRUBIN TOTAL: 0.7 mg/dL (ref 0.3–1.2)
BUN: 16 mg/dL (ref 6–20)
CHLORIDE: 102 mmol/L (ref 101–111)
CO2: 27 mmol/L (ref 22–32)
Calcium: 9.3 mg/dL (ref 8.9–10.3)
Creatinine, Ser: 1.07 mg/dL (ref 0.61–1.24)
GFR calc Af Amer: >60 mL/min (ref >60)
GLUCOSE: 89 mg/dL (ref 65–99)
POTASSIUM: 3.9 mmol/L (ref 3.5–5.1)
Sodium: 137 mmol/L (ref 135–145)
TOTAL PROTEIN: 7 g/dL (ref 6.5–8.1)

## 2017-03-17 LAB — ETHANOL

## 2017-03-17 LAB — SALICYLATE LEVEL: Salicylate Lvl: <7 mg/dL (ref 2.8–30.0)

## 2017-03-17 MED ORDER — SODIUM CHLORIDE 0.9 % IV BOLUS (SEPSIS)
1000.0000 mL | Freq: Once | INTRAVENOUS | Status: AC
  Administered 2017-03-17: 1000 mL via INTRAVENOUS

## 2017-03-17 NOTE — BH Assessment (Addendum)
Assessment Note  Thomas [name] is an 21 y.o. male that presents this date impaired (on admission) and per admission notes, admitted to active S/I with a plan to overdose. Patient denies S/I during assessment stating he made statements of self harm on admission because "he was high and paranoid. Patient has multiple admissions to [facility] with last admission on 02/06/17 for excessive SA use possible overdose. Patient admits this date to using multiple substances but is vague in reference to substances/amounts. Patient did admit to using some "bad weed" this date and became very paranoid. Patient stated he "thought he was dying" and decided to present to [facility]. Patient denies any S/I, H/I or AVH during assessment. Patient did admit to active S/I on admission per that note, patient presents to the Emergency Department via EMS complaining of sudden onset, drug overdose that occurred this morning. Patient states he has been using/abusing alcohol, marijuana, molly, cocaine, codeine cough syrup, and kratom over the past few months and reports today that he took a mixture of those drugs. Per patient, the abuse was intentional and he had intentions of hurting himself. Patient reports associated hallucinations withy his SA use. Case was staffed with Shaune Pollack DNP who recommended patient be re-valuated in the a.m.   Diagnosis: Bipolar 1 D/O, GAD (per notes)   Past Medical History:  Past Medical History:  Diagnosis Date  . ADHD (attention deficit hyperactivity disorder)   . Anxiety   . Bipolar 1 disorder (HCC)   . Current smoker   . Depression   . Eating disorder   . Headache(784.0)   . History of ADHD 11/01/2015  . Medical history non-contributory   . Mental disorder   . Nicotine dependence 11/03/2015  . 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 No pertinent surgical history.  Family History: No family history on file.  Social History:  reports that he has been  smoking Cigarettes.  He has been smoking about 0.00 packs per day for the past 4.00 years. He has never used smokeless tobacco. He reports that he uses drugs, including Marijuana, Cocaine, LSD, and MDMA (Ecstacy). He reports that he does not drink alcohol.  Additional Social History:  Alcohol / Drug Use Pain Medications: See MAR Prescriptions: See MAR Over the Counter: See MAR History of alcohol / drug use?: Yes Longest period of sobriety (when/how long): Unknown Negative Consequences of Use: Personal relationships, Legal Withdrawal Symptoms: Agitation (Paranoid) Substance #1 Name of Substance 1: Delsym 1 - Age of First Use: UTA 1 - Amount (size/oz): Pt reported, drinking two bottles of Delsym at different times a little over a week ago.  1 - Frequency: UTA 1 - Duration: UTA 1 - Last Use / Amount: Pt currently denies Substance #2 Name of Substance 2: Cocaine 2 - Age of First Use: 21 years of age 27 - Amount (size/oz): Unknown 2 - Frequency: Three or four times a week  2 - Duration: ongoing 2 - Last Use / Amount: Unknown Substance #3 Name of Substance 3: Cannabis 3 - Age of First Use: unknown 3 - Amount (size/oz): Varies 3 - Frequency: Daily 3 - Duration: Ongoing 3 - Last Use / Amount: 03/16/17 1 gram  Substance #4 Name of Substance 4: Cold medication  4 - Age of First Use: Teens 4 - Amount (size/oz): Varies 4 - Frequency: Every few days 4 - Duration: On-going 4 - Last Use / Amount: Unknown Substance #5 Name of Substance 5: LSD 5 - Age of  First Use: Unknown 5 - Amount (size/oz): Has only used "a few times" 5 - Frequency: unknown 5 - Duration: Denies this date 5 - Last Use / Amount: Unknown  CIWA: CIWA-Ar BP: 129/66 Pulse Rate: (!) 102 COWS:    Allergies: No Known Allergies  Home Medications:  (Not in a hospital admission)  OB/GYN Status:  No LMP for male patient.  General Assessment Data Location of Assessment: [facility] ED TTS Assessment: In system Is this a Tele or  Face-to-Face Assessment?: Face-to-Face Is this an Initial Assessment or a Re-assessment for this encounter?: Initial Assessment Marital status: Single Maiden name: NA Is patient pregnant?: No Pregnancy Status: No Living Arrangements:  (Pt states he resides with partner) Can pt return to current living arrangement?: Yes Admission Status: Voluntary Is patient capable of signing voluntary admission?: Yes Referral Source: Self/Family/Friend [facility]  Medical Screening Exam [facility] Walk-in ONLY) Medical Exam completed: Yes  Crisis Care Plan Living Arrangements:  (Pt states he resides with partner) Legal Guardian:  (Self) Name of Psychiatrist: [facility] Name of Therapist: [facility]  Education Status Is patient currently in school?: No Current Grade: NA Highest grade of school patient has completed:  (GED) Name of school: NA Contact person: NA  Risk to self with the past 6 months Suicidal Ideation: No (pt denies on assesssment) Has patient been a risk to self within the past 6 months prior to admission? : Yes Suicidal Intent: No (pt admitts on admission but denies on assessment) Has patient had any suicidal intent within the past 6 months prior to admission? : Yes Is patient at risk for suicide?: Yes (Excessive SA use) Suicidal Plan?: No (on admission pt stated he was going to OD) Has patient had any suicidal plan within the past 6 months prior to admission? : Yes Specify Current Suicidal Plan: pt denies Access to Means:  (NA) Specify Access to Suicidal Means:  (pt stated he had "drugs") What has been your use of drugs/alcohol within the last 12 months?: Current use Previous Attempts/Gestures: Yes How many times?:  (Multiple) Other Self Harm Risks: Pt denies Triggers for Past Attempts:  (Excessive SA use) Intentional Self Injurious Behavior:  (None) Comment - Self Injurious Behavior: NA Family Suicide History: Yes Recent stressful life event(s): Other  (Comment) (Relationship issues) Persecutory voices/beliefs?: No Depression: No (pt denies) Depression Symptoms:  (pt denies) Substance abuse history and/or treatment for substance abuse?: Yes Suicide prevention information given to non-admitted patients: Not applicable  Risk to Others within the past 6 months Homicidal Ideation: No Does patient have any lifetime risk of violence toward others beyond the six months prior to admission? : No Thoughts of Harm to Others: No Comment - Thoughts of Harm to Others: NA Current Homicidal Intent: No Current Homicidal Plan: No Access to Homicidal Means: No Identified Victim: NA History of harm to others?: No Assessment of Violence: None Noted Violent Behavior Description: NA Does patient have access to weapons?: No Criminal Charges Pending?: No Describe Pending Criminal Charges: NA Does patient have a court date: No Court Date:  (NA) Is patient on probation?: Unknown  Psychosis Hallucinations: None noted Delusions: None noted  Mental Status Report Appearance/Hygiene: In scrubs Eye Contact: Fair Motor Activity: Freedom of movement Speech: Unremarkable Level of Consciousness: Drowsy Mood: Pleasant Affect: Appropriate to circumstance Anxiety Level: Minimal Thought Processes: Coherent, Relevant Judgement: Partial Orientation: Person, Place, Time Obsessive Compulsive Thoughts/Behaviors: None  Cognitive Functioning Concentration: Decreased Memory: Recent Intact, Remote Intact IQ: Average Insight: Poor Impulse  Control: Poor Appetite: Fair Weight Loss: 0 Weight Gain: 0 Sleep: No Change Total Hours of Sleep: 7 Vegetative Symptoms: None  ADLScreening [facility] Assessment Services) Patient's cognitive ability adequate to safely complete daily activities?: Yes Patient able to express need for assistance with ADLs?: Yes Independently performs ADLs?: Yes (appropriate for developmental age)  Prior Inpatient Therapy Prior Inpatient  Therapy: Yes Prior Therapy Dates:  (Multiple) Prior Therapy Facilty/Provider(s): [facility], [facility],  Reason for Treatment: MH issues  Prior Outpatient Therapy Prior Outpatient Therapy: Yes Prior Therapy Dates: Ongoing Prior Therapy Facilty/Provider(s): [facility] Reason for Treatment: Medication management Does patient have an ACCT team?: Yes Does patient have Intensive In-House Services?  : No Does patient have [facility] services? : No Does patient have P4CC services?: No  ADL Screening (condition at time of admission) Patient's cognitive ability adequate to safely complete daily activities?: Yes Is the patient deaf or have difficulty hearing?: No Does the patient have difficulty seeing, even when wearing glasses/contacts?: No Does the patient have difficulty concentrating, remembering, or making decisions?: No Patient able to express need for assistance with ADLs?: Yes Does the patient have difficulty dressing or bathing?: No Independently performs ADLs?: Yes (appropriate for developmental age) Does the patient have difficulty walking or climbing stairs?: No Weakness of Legs: None Weakness of Arms/Hands: None  Home Assistive Devices/Equipment Home Assistive Devices/Equipment: None  Therapy Consults (therapy consults require a physician order) PT Evaluation Needed: No OT Evalulation Needed: No SLP Evaluation Needed: No Abuse/Neglect Assessment (Assessment to be complete while patient is alone) Physical Abuse: Denies Verbal Abuse: Denies Sexual Abuse: Denies Exploitation of patient/patient's resources: Denies Self-Neglect: Denies Values / Beliefs Cultural Requests During Hospitalization: None Spiritual Requests During Hospitalization: None Consults Spiritual Care Consult Needed: No Social Work Consult Needed: No Merchant navy officer (For Healthcare) Does Patient Have a Medical Advance Directive?: No Would patient like information on creating a medical advance directive?: No -  Patient declined    Additional Information 1:1 In Past 12 Months?: No CIRT Risk: No Elopement Risk: No Does patient have medical clearance?: Yes     Disposition: Case was staffed with Shaune Pollack DNP who recommended patient be re-evaluated in the a.m.  Disposition Initial Assessment Completed for this Encounter: Yes Disposition of Patient: Other dispositions Other disposition(s): Other (Comment) (pt is IVCed and will be re-evaluated in a.m.)  On Site Evaluation by:   Reviewed with Physician:    Alfredia Ferguson 03/17/2017 4:17 PM

## 2017-03-17 NOTE — ED Notes (Signed)
SBAR Report received from previous nurse. Pt received calm and visible on unit. Pt denies current SI/ HI, A/V H, depression, anxiety, or pain at this time, and appears otherwise stable and free of distress. Pt reminded of camera surveillance, q 15 min rounds, and rules of the milieu. Will continue to assess. Pt came to nurse station and asked if he could leave. EDP notified and pt has agreed to stay at this time, and pt fell back asleep,

## 2017-03-17 NOTE — ED Notes (Signed)
Pt is alert in his room. Pt reports that he was using drugs and "I thought I was going to die" and "It scared the crap out of me." Pt verbally denies si and hi thoughts. He reports that he has had a decrease in sleep. Poison control called about ALT result and pt reports taking two tylenol last night with a headache. Reported result to EDP Little. Pt medically cleared.

## 2017-03-17 NOTE — ED Notes (Signed)
This Clinical research associatewriter called poison control per PA request. Pt told PA he ingested ETOH, cocaine, codeine cough syrup, marijuana, and kratom. With speaking with poison control, Kristi pt verbalizes time ingested 1000-1200 yesterday; Silva BandyKristi states no additional orders needs other than ones already placed; pt may be medically cleared to psych with orders results. PA aware of all.

## 2017-03-17 NOTE — ED Provider Notes (Signed)
[facility]-EMERGENCY DEPT Provider Note   CSN: [id number] Arrival date & time: 03/17/17  1133 By signing my name below, I, Rosana Fret, attest that this documentation has been prepared under the direction and in the presence of [employer], PA-C.  Electronically Signed: Rosana Fret, ED Scribe. 03/17/17. 12:18 PM.  History   Chief Complaint Chief Complaint  Patient presents with  . Suicidal  . Hallucinations   The history is provided by the patient. No language interpreter was used.   HPI Comments: Jeanluc Wegman is a 21 y.o. male who presents to the Emergency Department via EMS complaining of sudden onset, drug overdose that occurred this morning. Pt states he has been using/abusing alcohol, marijuana, molly, cocaine, codeine cough syrup, and kratom over the past few months and reports today that he took a mixture of those drugs. Per pt, the abuse was intentional and he had intentions of hurting himself. Pt reports associated hallucinations, CP, and abdominal pain. No other complaints at this time.  Past Medical History:  Diagnosis Date  . ADHD (attention deficit hyperactivity disorder)   . Anxiety   . Bipolar 1 disorder (HCC)   . Current smoker   . Depression   . Eating disorder   . Headache(784.0)   . History of ADHD 11/01/2015  . Medical history non-contributory   . Mental disorder   . Nicotine dependence 11/03/2015  . 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 Intentional drug overdose (HCC) 12/05/2016  . Acute encephalopathy 12/05/2016  . Sinus tachycardia 12/05/2016  . Hypotension 12/05/2016  . Overdose, intentional self-harm, initial encounter (HCC) 11/20/2016  . Intentional SSRI (selective serotonin reuptake inhibitor) overdose (HCC) 11/20/2016  . Substance induced mood disorder (HCC) 09/08/2016  . Homelessness 09/02/2016  . Bipolar affective disorder, current episode hypomanic  (HCC) 07/21/2016  . Attention deficit hyperactivity disorder (ADHD) 07/03/2016  . 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 Tobacco use disorder 07/03/2016  . Unspecified depressive  disorder 07/03/2016  . 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 Dextromethorphan overdose 06/03/2016  . Leukocytosis   . Cannabis use disorder, moderate, dependence (HCC) 12/04/2012    History reviewed. No pertinent surgical history.     Home Medications    Prior to Admission medications   Not on File    Family History No family history on file.  Social History Social History  Substance Use Topics  . Smoking status: Current Some Day Smoker    Packs/day: 0.00    Years: 4.00    Types: Cigarettes  . Smokeless tobacco: Never Used  . Alcohol use No     Allergies   Patient has no known allergies.   Review of Systems Review of Systems  Cardiovascular: Positive for chest pain.  Gastrointestinal: Positive for abdominal pain.  Psychiatric/Behavioral: Positive for hallucinations and suicidal ideas.  All other systems reviewed and are negative.    Physical Exam Updated Vital Signs BP 129/66 (BP Location: Right Arm)   Pulse (!) 102   Temp 98 F (36.7 C) (Oral)   Resp 18   Ht 5\' 6"  (1.676 m)   Wt 150 lb (68 kg)   SpO2 98%   BMI 24.21 kg/m   Physical Exam  Constitutional: He is oriented to person, place, and time. He appears well-developed and well-nourished.  Pt comfortable and resting easy in bed. Slightly diaphoretic.   HENT:  Head: Normocephalic and atraumatic.  Eyes: Pupils are equal, round,  and reactive to light.  Cardiovascular: Regular rhythm and normal heart sounds.  Exam reveals no gallop and no friction rub.   No murmur heard. Mild tachardia.   Pulmonary/Chest: Effort normal and breath sounds normal. No respiratory distress. He has no wheezes.  Abdominal: Soft. He exhibits no distension. There is no tenderness.  Neurological: He is alert and oriented  to person, place, and time.  Skin: Skin is warm. He is diaphoretic.  Psychiatric: He has a normal mood and affect.  Nursing note and vitals reviewed.   ED Treatments / Results  DIAGNOSTIC STUDIES: Oxygen Saturation is 98% on RA, normal by my interpretation.   Labs (all labs ordered are listed, but only abnormal results are displayed) Labs Reviewed  COMPREHENSIVE METABOLIC PANEL - Abnormal; Notable for the following:       Result Value   AST 44 (*)    ALT 158 (*)    All other components within normal limits  ACETAMINOPHEN LEVEL - Abnormal; Notable for the following:    Acetaminophen (Tylenol), Serum <10 (*)    All other components within normal limits  RAPID URINE DRUG SCREEN, HOSP PERFORMED - Abnormal; Notable for the following:    Cocaine POSITIVE (*)    Tetrahydrocannabinol POSITIVE (*)    All other components within normal limits  ETHANOL  SALICYLATE LEVEL  CBC    EKG  EKG Interpretation  Date/Time:  Saturday March 17 2017 12:00:20 EDT Ventricular Rate:  104 PR Interval:    QRS Duration: 99 QT Interval:  341 QTC Calculation: 449 R Axis:   75 Text Interpretation:  Sinus tachycardia No significant change since last tracing Confirmed by Shaune PollackIsaacs, Cameron [phone]) on 03/17/2017 12:51:48 PM       Radiology Dg Chest 2 View  Result Date: 03/17/2017 CLINICAL DATA:  PA at bedside and pt verbalizes to PA "took a bunch of pills to die." This is different from previous assessment EXAM: CHEST  2 VIEW COMPARISON:  Chest x-ray dated 12/05/2016 FINDINGS: The heart size and mediastinal contours are within normal limits. Both lungs are clear. The visualized skeletal structures are unremarkable. IMPRESSION: No active cardiopulmonary disease. No evidence of pneumonia or pulmonary edema. Electronically Signed   By: Bary RichardStan  Maynard M.D.   On: 03/17/2017 12:29    Procedures Procedures (including critical care time)  Medications Ordered in ED Medications  sodium chloride 0.9 % bolus 1,000  mL (0 mLs Intravenous Stopped 03/17/17 1422)     Initial Impression / Assessment and Plan / ED Course  I have reviewed the triage vital signs and the nursing notes.  Pertinent labs & imaging results that were available during my care of the patient were reviewed by me and considered in my medical decision making (see chart for details).      Final Clinical Impressions(s) / ED Diagnoses   Final diagnoses:  Polysubstance abuse  Suicidal ideations   Labs: Rapid urine drug screen, Acetaminophen level, Ethanol, CBC, Salicylate level, CMP  Imaging: DG Chest 2 View, EKG  Consults:   Therapeutics:   Discharge Meds:  Assessment/Plan: 21 year old male presents today with complaints of drug abuse and suicidal ideations. He reports that he took the drugs in an attempt to harm himself. Patient reports he's been abusing drugs for a prolonged period of time. Patient is medically cleared and will be evaluated by TTS  Patient has have elevations in his AST and ALT at 44 and 158 respectively- no significant elevations in the past. Nursing staff consult at poison control  who recommended no treatment. I also consult with poison control who did not feel this elevation was likely due to Tylenol and did not need immediate treatment. Patient will follow up as an outpatient for repeat liver function tests.  TTS with recommendation for overnight observation.  New Prescriptions New Prescriptions   No medications on file   I personally performed the services described in this documentation, which was scribed in my presence. The recorded information has been reviewed and is accurate.        Eyvonne Mechanic, PA-C 03/17/17 Sable Feil    Shaune Pollack, MD 03/18/17 [phone]

## 2017-03-17 NOTE — [facility] Assessment (Signed)
[facility] Assessment Progress Note Case was staffed with Shaune PollackLord DNP who recommended patient be re-valuated in the a.m.

## 2017-03-17 NOTE — ED Notes (Signed)
PA at bedside and pt verbalizes to PA "took a bunch of pills to die." This is different from previous assessment.

## 2017-03-17 NOTE — Discharge Instructions (Signed)
Please read attached information. If you experience any new or worsening signs or symptoms please return to the emergency room for evaluation. Please follow-up with your primary care provider or specialist as discussed. Please follow up with your primary care doctor for repeat laboratory analysis for elevation in liver function tests.

## 2017-03-17 NOTE — ED Triage Notes (Addendum)
Per EMS pt request evaluation related to visual and auditory hallucinations that "tell good and bad of the world." Pt denies SI/HI. Pt admits to ETOH and marijuana syrup use.

## 2017-03-18 DIAGNOSIS — F1910 Other psychoactive substance abuse, uncomplicated: Secondary | ICD-10-CM | POA: Diagnosis present

## 2017-03-18 DIAGNOSIS — F1490 Cocaine use, unspecified, uncomplicated: Secondary | ICD-10-CM

## 2017-03-18 DIAGNOSIS — F3170 Bipolar disorder, currently in remission, most recent episode unspecified: Secondary | ICD-10-CM

## 2017-03-18 DIAGNOSIS — F1290 Cannabis use, unspecified, uncomplicated: Secondary | ICD-10-CM

## 2017-03-18 DIAGNOSIS — F1994 Other psychoactive substance use, unspecified with psychoactive substance-induced mood disorder: Secondary | ICD-10-CM

## 2017-03-18 DIAGNOSIS — F319 Bipolar disorder, unspecified: Secondary | ICD-10-CM | POA: Diagnosis present

## 2017-03-18 DIAGNOSIS — F17210 Nicotine dependence, cigarettes, uncomplicated: Secondary | ICD-10-CM

## 2017-03-18 NOTE — ED Notes (Signed)
Pt A/O, no noted distress. Denies pain, AVH, SI and HI. Pt noted he slept well. Pt noted "he wilstop smoking marijuana. He would like outpt resources to get assist to stop smoking."  Staff will continue to monitor, meet needs, and maintain safety.

## 2017-03-18 NOTE — ED Notes (Signed)
Pt showered

## 2017-03-18 NOTE — [facility] Suicide Risk Assessment (Signed)
Suicide Risk Assessment  Discharge Assessment   [facility] Discharge Suicide Risk Assessment   Principal Problem: Substance induced mood disorder [facility]) Discharge Diagnoses:  Patient Active Problem List   Diagnosis Date Noted  . Bipolar affective disorder (HCC) [F31.9] 03/18/2017    Priority: High  . Polysubstance abuse [F19.10] 03/18/2017    Priority: High  . Substance induced mood disorder (HCC) [F19.94] 09/08/2016    Priority: High  . Intentional drug overdose (HCC) [T50.902A] 12/05/2016  . Acute encephalopathy [G93.40] 12/05/2016  . Sinus tachycardia [R00.0] 12/05/2016  . Hypotension [I95.9] 12/05/2016  . Overdose, intentional self-harm, initial encounter (HCC) [T50.902A] 11/20/2016  . Intentional SSRI (selective serotonin reuptake inhibitor) overdose (HCC) [T43.222A] 11/20/2016  . Homelessness [Z59.0] 09/02/2016  . Attention deficit hyperactivity disorder (ADHD) [F90.9] 07/03/2016  . 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 Tobacco use disorder [F17.200] 07/03/2016  . Unspecified depressive  disorder [F32.9] 07/03/2016  . 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 Dextromethorphan overdose [T48.3X1A] 06/03/2016  . Leukocytosis [D72.829]   . Cannabis use disorder, moderate, dependence (HCC) [F12.20] 12/04/2012    Total Time spent with patient: 45 minutes  Musculoskeletal: Strength & Muscle Tone: within normal limits Gait & Station: normal Patient leans: N/A  Psychiatric Specialty Exam: Physical Exam  Constitutional: He is oriented to person, place, and time. He appears well-developed and well-nourished.  HENT:  Head: Normocephalic.  Neck: Normal range of motion.  Respiratory: Effort normal.  Musculoskeletal: Normal range of motion.  Neurological: He is alert and oriented to person, place, and time.  Psychiatric: He has a normal mood and affect. His speech is normal and behavior is normal. Judgment and thought content normal. Cognition and  memory are normal.    Review of Systems  Psychiatric/Behavioral: Positive for substance abuse.  All other systems reviewed and are negative.   Blood pressure 97/65, pulse 65, temperature 97.8 F (36.6 C), temperature source Oral, resp. rate 16, height 5\' 6"  (1.676 m), weight 68 kg (150 lb), SpO2 97 %.Body mass index is 24.21 kg/m.  General Appearance: Casual  Eye Contact:  Good  Speech:  Normal Rate  Volume:  Normal  Mood:  Euthymic  Affect:  Congruent  Thought Process:  Coherent and Descriptions of Associations: Intact  Orientation:  Full (Time, Place, and Person)  Thought Content:  WDL and Logical  Suicidal Thoughts:  No  Homicidal Thoughts:  No  Memory:  Immediate;   Good Recent;   Good Remote;   Good  Judgement:  Fair  Insight:  Fair  Psychomotor Activity:  Normal  Concentration:  Concentration: Good and Attention Span: Good  Recall:  Good  Fund of Knowledge:  Fair  Language:  Good  Akathisia:  No  Handed:  Right  AIMS (if indicated):     Assets:  Housing Intimacy Leisure Time Physical Health Resilience Social Support  ADL's:  Intact  Cognition:  WNL  Sleep:      Mental Status Per Nursing Assessment::   On Admission:   overdose, unintentional  Demographic Factors:  Male, Adolescent or young adult and Caucasian  Loss Factors: NA  Historical Factors: Impulsivity  Risk Reduction Factors:   Sense of responsibility to family, Living with another person, especially a relative, Positive social support and Positive therapeutic relationship  Continued Clinical Symptoms:  None  Cognitive Features That Contribute To Risk:  None    Suicide Risk:  Minimal: No identifiable suicidal ideation.  Patients presenting with no risk factors but with morbid ruminations; may be  classified as minimal risk based on the severity of the depressive symptoms    Plan Of Care/Follow-up recommendations:  Activity:  as tolerated Diet:  heart healthy diet  Kauan Kloosterman,  NP 03/18/2017, 10:41 AM

## 2017-03-18 NOTE — ED Notes (Signed)
Patient denies pain and is resting comfortably.  

## 2017-03-18 NOTE — Consult Note (Signed)
[facility] Consult   Reason for Consult:  Overdose  Referring Physician:  EDP Patient Identification: Thomas [name] MRN:  [id number] Principal Diagnosis: Substance induced mood disorder ([place]) Diagnosis:   Patient Active Problem List   Diagnosis Date Noted  . Bipolar affective disorder ([place]) [F31.9] 03/18/2017    Priority: High  . Polysubstance abuse [F19.10] 03/18/2017    Priority: High  . Substance induced mood disorder ([place]) [F19.94] 09/08/2016    Priority: High  . Intentional drug overdose ([place]) [T50.902A] 12/05/2016  . Acute encephalopathy [G93.40] 12/05/2016  . Sinus tachycardia [R00.0] 12/05/2016  . Hypotension [I95.9] 12/05/2016  . Overdose, intentional self-harm, initial encounter ([place]) [T50.902A] 11/20/2016  . Intentional SSRI (selective serotonin reuptake inhibitor) overdose ([place]) [T43.222A] 11/20/2016  . Homelessness [Z59.0] 09/02/2016  . Attention deficit hyperactivity disorder (ADHD) [F90.9] 07/03/2016  . 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 Tobacco use disorder [F17.200] 07/03/2016  . Unspecified depressive  disorder [F32.9] 07/03/2016  . Dextromethorphan use disorder, severe, dependence ([place]) [F19.20] 07/03/2016  . Dextromethorphan overdose [T48.3X1A] 06/03/2016  . Leukocytosis [D72.829]   . Cannabis use disorder, moderate, dependence ([place]) [F12.20] 12/04/2012    Total Time spent with patient: 45 minutes  Subjective:   Thomas [name] is a 21 y.o. male patient does not warrant admission.  HPI:  21 yo male who presented to the ED after abusing drugs to get "high".  Initially, it was unsure if this was a suicide attempt but when he cleared cognitively he denies this.  He reports he was using cocaine and felt like he was going to die which scared him and brought him to the ED.  Hever is well known to this ED and providers, substance abuse is an issue for him but he does not want help for it.  He does have an ACT team, [facility], who was  notified of him being here.  Denies suicidal/homicidal ideations, hallucinations, and withdrawal symptoms.  Educated him about substance abuse.   Past Psychiatric History: substance abuse, bipolar disorder  Risk to Self: NOne Risk to Others: Homicidal Ideation: No Thoughts of Harm to Others: No Comment - Thoughts of Harm to Others: NA Current Homicidal Intent: No Current Homicidal Plan: No Access to Homicidal Means: No Identified Victim: NA History of harm to others?: No Assessment of Violence: None Noted Violent Behavior Description: NA Does patient have access to weapons?: No Criminal Charges Pending?: No Describe Pending Criminal Charges: NA Does patient have a court date: No Court Date:  (NA) Prior Inpatient Therapy: Prior Inpatient Therapy: Yes Prior Therapy Dates:  (Multiple) Prior Therapy Facilty/Provider(s): [facility], [facility],  Reason for Treatment: MH issues Prior Outpatient Therapy: Prior Outpatient Therapy: Yes Prior Therapy Dates: Ongoing Prior Therapy Facilty/Provider(s): [facility] Reason for Treatment: Medication management Does patient have an ACCT team?: Yes Does patient have Intensive In-House Services?  : No Does patient have [facility] services? : No Does patient have P4CC services?: No  Past Medical History:  Past Medical History:  Diagnosis Date  . ADHD (attention deficit hyperactivity disorder)   . Anxiety   . Bipolar 1 disorder ([place])   . Current smoker   . Depression   . Eating disorder   . Headache(784.0)   . History of ADHD 11/01/2015  . Medical history non-contributory   . Mental disorder   . Nicotine dependence 11/03/2015  . Psychoactive substance-induced mood disorder ([place]) 10/28/2015   History reviewed. No pertinent surgical history. Family History: No family history on file. Family Psychiatric  History: unknown  Social History:  History  Alcohol Use No     History  Drug Use  . Types: Marijuana, Cocaine, LSD, MDMA (Ecstacy)    Comment: Pt reports  using Molly as well and reports using these drugs     Social History   Social History  . Marital status: Single    Spouse name: N/A  . Number of children: N/A  . Years of education: N/A   Social History Main Topics  . Smoking status: Current Some Day Smoker    Packs/day: 0.00    Years: 4.00    Types: Cigarettes  . Smokeless tobacco: Never Used  . Alcohol use No  . Drug use: Yes    Types: Marijuana, Cocaine, LSD, MDMA (Ecstacy)     Comment: Pt reports using Molly as well and reports using these drugs   . Sexual activity: Yes    Birth control/ protection: None   Other Topics Concern  . None   Social History Narrative  . None   Additional Social History:    Allergies:  No Known Allergies  Labs:  Results for orders placed or performed during the hospital encounter of 03/17/17 (from the past 48 hour(s))  Rapid urine drug screen (hospital performed)     Status: Abnormal   Collection Time: 03/17/17 11:46 AM  Result Value Ref Range   Opiates NONE DETECTED NONE DETECTED   Cocaine POSITIVE (A) NONE DETECTED   Benzodiazepines NONE DETECTED NONE DETECTED   Amphetamines NONE DETECTED NONE DETECTED   Tetrahydrocannabinol POSITIVE (A) NONE DETECTED   Barbiturates NONE DETECTED NONE DETECTED    Comment:        DRUG SCREEN FOR MEDICAL PURPOSES ONLY.  IF CONFIRMATION IS NEEDED FOR ANY PURPOSE, NOTIFY LAB WITHIN 5 DAYS.        LOWEST DETECTABLE LIMITS FOR URINE DRUG SCREEN Drug Class       Cutoff (ng/mL) Amphetamine      1000 Barbiturate      200 Benzodiazepine   622 Tricyclics       297 Opiates          300 Cocaine          300 THC              50   Comprehensive metabolic panel     Status: Abnormal   Collection Time: 03/17/17 12:03 PM  Result Value Ref Range   Sodium 137 135 - 145 mmol/L   Potassium 3.9 3.5 - 5.1 mmol/L   Chloride 102 101 - 111 mmol/L   CO2 27 22 - 32 mmol/L   Glucose, Bld 89 65 - 99 mg/dL   BUN 16 6 - 20 mg/dL   Creatinine, Ser 1.07 0.61 - 1.24  mg/dL   Calcium 9.3 8.9 - 10.3 mg/dL   Total Protein 7.0 6.5 - 8.1 g/dL   Albumin 4.1 3.5 - 5.0 g/dL   AST 44 (H) 15 - 41 U/L   ALT 158 (H) 17 - 63 U/L   Alkaline Phosphatase 70 38 - 126 U/L   Total Bilirubin 0.7 0.3 - 1.2 mg/dL   GFR calc non Af Amer >60 >60 mL/min   GFR calc Af Amer >60 >60 mL/min    Comment: (NOTE) The eGFR has been calculated using the CKD EPI equation. This calculation has not been validated in all clinical situations. eGFR's persistently <60 mL/min signify possible Chronic Kidney Disease.    Anion gap 8 5 - 15  Ethanol  Status: None   Collection Time: 03/17/17 12:03 PM  Result Value Ref Range   Alcohol, Ethyl (B) <5 <5 mg/dL    Comment:        LOWEST DETECTABLE LIMIT FOR SERUM ALCOHOL IS 5 mg/dL FOR MEDICAL PURPOSES ONLY   Salicylate level     Status: None   Collection Time: 03/17/17 12:03 PM  Result Value Ref Range   Salicylate Lvl <8.5 2.8 - 30.0 mg/dL  Acetaminophen level     Status: Abnormal   Collection Time: 03/17/17 12:03 PM  Result Value Ref Range   Acetaminophen (Tylenol), Serum <10 (L) 10 - 30 ug/mL    Comment:        THERAPEUTIC CONCENTRATIONS VARY SIGNIFICANTLY. A RANGE OF 10-30 ug/mL MAY BE AN EFFECTIVE CONCENTRATION FOR MANY PATIENTS. HOWEVER, SOME ARE BEST TREATED AT CONCENTRATIONS OUTSIDE THIS RANGE. ACETAMINOPHEN CONCENTRATIONS >150 ug/mL AT 4 HOURS AFTER INGESTION AND >50 ug/mL AT 12 HOURS AFTER INGESTION ARE OFTEN ASSOCIATED WITH TOXIC REACTIONS.   cbc     Status: None   Collection Time: 03/17/17 12:03 PM  Result Value Ref Range   WBC 8.4 4.0 - 10.5 K/uL   RBC 4.59 4.22 - 5.81 MIL/uL   Hemoglobin 14.7 13.0 - 17.0 g/dL   HCT 41.6 39.0 - 52.0 %   MCV 90.6 78.0 - 100.0 fL   MCH 32.0 26.0 - 34.0 pg   MCHC 35.3 30.0 - 36.0 g/dL   RDW 13.6 11.5 - 15.5 %   Platelets 209 150 - 400 K/uL    No current facility-administered medications for this encounter.    No current outpatient prescriptions on file.     Musculoskeletal: Strength & Muscle Tone: within normal limits Gait & Station: normal Patient leans: N/A  Psychiatric Specialty Exam: Physical Exam  Constitutional: He is oriented to person, place, and time. He appears well-developed and well-nourished.  HENT:  Head: Normocephalic.  Neck: Normal range of motion.  Respiratory: Effort normal.  Musculoskeletal: Normal range of motion.  Neurological: He is alert and oriented to person, place, and time.  Psychiatric: He has a normal mood and affect. His speech is normal and behavior is normal. Judgment and thought content normal. Cognition and memory are normal.    Review of Systems  Psychiatric/Behavioral: Positive for substance abuse.  All other systems reviewed and are negative.   Blood pressure 97/65, pulse 65, temperature 97.8 F (36.6 C), temperature source Oral, resp. rate 16, height '5\' 6"'  (1.676 m), weight 68 kg (150 lb), SpO2 97 %.Body mass index is 24.21 kg/m.  General Appearance: Casual  Eye Contact:  Good  Speech:  Normal Rate  Volume:  Normal  Mood:  Euthymic  Affect:  Congruent  Thought Process:  Coherent and Descriptions of Associations: Intact  Orientation:  Full (Time, Place, and Person)  Thought Content:  WDL and Logical  Suicidal Thoughts:  No  Homicidal Thoughts:  No  Memory:  Immediate;   Good Recent;   Good Remote;   Good  Judgement:  Fair  Insight:  Fair  Psychomotor Activity:  Normal  Concentration:  Concentration: Good and Attention Span: Good  Recall:  Good  Fund of Knowledge:  Fair  Language:  Good  Akathisia:  No  Handed:  Right  AIMS (if indicated):     Assets:  Housing Intimacy Leisure Time Physical Health Resilience Social Support  ADL's:  Intact  Cognition:  WNL  Sleep:        Treatment Plan Summary: Daily contact with  patient to assess and evaluate symptoms and progress in treatment, Medication management and Plan substance abuse with substance induced mood disorder:   -Crisis stabilization -Medication management:  None started as he needed to clear the substances he abused -Individual and substance abuse counseling -Coordinations of care with his ACT team, [facility]  Disposition: No evidence of imminent risk to self or others at present.    Waylan Boga, NP 03/18/2017 10:38 AM

## 2017-03-18 NOTE — ED Notes (Addendum)
Pt A/O, no noted distress. Educated pt on discharge instruction and follow up appt. He understands to contact ACT team once discharge. Pt has all belongings. Pt given bus pass and snacks.

## 2017-04-08 ENCOUNTER — Emergency Department (HOSPITAL_COMMUNITY)
Admission: EM | Admit: 2017-04-08 | Discharge: 2017-04-09 | Disposition: A | Discharge status: Home / Self Care | Payer: Medicaid Other | Attending: Emergency Medicine | Admitting: Emergency Medicine

## 2017-04-08 ENCOUNTER — Encounter (HOSPITAL_COMMUNITY): Payer: Self-pay

## 2017-04-08 DIAGNOSIS — F1410 Cocaine abuse, uncomplicated: Secondary | ICD-10-CM | POA: Diagnosis not present

## 2017-04-08 DIAGNOSIS — F909 Attention-deficit hyperactivity disorder, unspecified type: Secondary | ICD-10-CM | POA: Insufficient documentation

## 2017-04-08 DIAGNOSIS — F1210 Cannabis abuse, uncomplicated: Secondary | ICD-10-CM | POA: Insufficient documentation

## 2017-04-08 DIAGNOSIS — F17210 Nicotine dependence, cigarettes, uncomplicated: Secondary | ICD-10-CM | POA: Insufficient documentation

## 2017-04-08 DIAGNOSIS — F1910 Other psychoactive substance abuse, uncomplicated: Secondary | ICD-10-CM

## 2017-04-08 NOTE — ED Provider Notes (Signed)
[facility]-EMERGENCY DEPT Provider Note   CSN: [id number] Arrival date & time: 04/08/17  2337  By signing my name below, I, Diona BrownerJennifer Gorman, attest that this documentation has been prepared under the direction and in the presence of Azalia Bilisampos, Emmery Seiler, MD. Electronically Signed: Diona BrownerJennifer Gorman, ED Scribe. 04/08/17. 11:49 PM.  LEVEL V CAVEAT: HPI and ROS limited due to substance abuse.  History   Chief Complaint Chief Complaint  Patient presents with  . Drug Overdose    HPI Thomas [name] is a 21 y.o. male who presents to the Emergency Department with a chief complaint of a possible drug overdose. Pt was found on [facility] campus. 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 [facility]. Pt doesn't remember what he was doing prior to coming to the ED. Pt admitted to having done weed, cough syrup, and molly in the past. Normally he doesn't drink ETOH.   The history is provided by the patient. The history is limited by the condition of the patient. No language interpreter was used.    Past Medical History:  Diagnosis Date  . ADHD (attention deficit hyperactivity disorder)   . Anxiety   . Bipolar 1 disorder (HCC)   . Current smoker   . Depression   . Eating disorder   . Headache(784.0)   . History of ADHD 11/01/2015  . Medical history non-contributory   . Mental disorder   . Nicotine dependence 11/03/2015  . 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 Bipolar affective disorder (HCC) 03/18/2017  . Polysubstance abuse 03/18/2017  . Intentional drug overdose (HCC) 12/05/2016  . Acute encephalopathy 12/05/2016  . Sinus tachycardia 12/05/2016  . Hypotension 12/05/2016  . Overdose, intentional self-harm, initial encounter (HCC) 11/20/2016  . Intentional SSRI (selective serotonin reuptake inhibitor) overdose (HCC) 11/20/2016  . Substance induced mood disorder (HCC) 09/08/2016  . Homelessness  09/02/2016  . Attention deficit hyperactivity disorder (ADHD) 07/03/2016  . 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 Tobacco use disorder 07/03/2016  . Unspecified depressive  disorder 07/03/2016  . 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 Dextromethorphan overdose 06/03/2016  . Leukocytosis   . Cannabis use disorder, moderate, dependence (HCC) 12/04/2012    History reviewed. No pertinent surgical history.     Home Medications    Prior to Admission medications   Not on File    Family History History reviewed. No pertinent family history.  Social History Social History  Substance Use Topics  . Smoking status: Current Some Day Smoker    Packs/day: 0.00    Years: 4.00    Types: Cigarettes  . Smokeless tobacco: Never Used  . Alcohol use No     Allergies   Patient has no known allergies.   Review of Systems Review of Systems  Unable to perform ROS: Other (Substance abuse)     Physical Exam Updated Vital Signs BP 116/69 (BP Location: Left Arm)   Pulse (!) 102   Temp 98.2 F (36.8 C) (Oral)   Resp 20   Ht 5\' 8"  (1.727 m)   Wt 150 lb (68 kg)   SpO2 96%   BMI 22.81 kg/m   Physical Exam  Constitutional: He appears well-developed and well-nourished.  HENT:  Head: Normocephalic.  Eyes: EOM are normal.  Neck: Normal range of motion.  Pulmonary/Chest: Effort normal.  Abdominal: He exhibits no distension.  Musculoskeletal: Normal range of motion.  Neurological:  Slurred speech. Follows commands. Results 4 extremities equally. Opens eyes to voice.  Psychiatric: He has a normal mood and affect.  Nursing note and vitals reviewed.    ED Treatments / Results  DIAGNOSTIC STUDIES: Oxygen Saturation is 96% on RA, adequate by my interpretation.   Labs (all labs ordered are listed, but only abnormal results are displayed) Labs Reviewed - No data to display  EKG  EKG Interpretation  Date/Time:  Sunday April 08 2017 23:38:58  EDT Ventricular Rate:  101 PR Interval:    QRS Duration: 95 QT Interval:  351 QTC Calculation: 455 R Axis:   173 Text Interpretation:  Sinus tachycardia S1,S2,S3 pattern No significant change was found Confirmed by Azalia Bilis ([id number]) on 04/09/2017 3:04:40 AM       Radiology No results found.  Procedures Procedures (including critical care time)  Medications Ordered in ED Medications - No data to display   Initial Impression / Assessment and Plan / ED Course  I have reviewed the triage vital signs and the nursing notes.  Pertinent labs & imaging results that were available during my care of the patient were reviewed by me and considered in my medical decision making (see chart for details).     Polysubstance abuse. Observed in the emergency department. Stable for discharge.  Final Clinical Impressions(s) / ED Diagnoses   Final diagnoses:  None    New Prescriptions New Prescriptions   No medications on file   I personally performed the services described in this documentation, which was scribed in my presence. The recorded information has been reviewed and is accurate.       Azalia Bilis, MD 04/09/17 [phone]

## 2017-04-08 NOTE — ED Notes (Signed)
Talked with poison control states to do EKG and watch for 6-8 hours until clear to discharge informed Dr Patria Maneampos of poison controls recommendations.

## 2017-04-08 NOTE — ED Notes (Signed)
Bed: RESA Expected date:  Expected time:  Means of arrival:  Comments: 7118m overdose

## 2017-04-08 NOTE — ED Triage Notes (Signed)
About 3 hours ago used cocaine and molly, weed dexamethorapine and now states he is supposed to go to [facility] no respiratory or acute distress noted. States not trying to hurt self but wants to stay in hospital for a really lone time.

## 2017-04-09 NOTE — ED Notes (Signed)
Pt alert, oriented, ambulatory, safe to leave ED. Reports having money for bus pass.

## 2017-04-09 NOTE — ED Notes (Signed)
Pt has 2 belonging bags in the cabinet at nursing station labeled for Res. A. Pt has - white jeans, shirt, belt, red hat,keys,[employer] phone, charger. Black shoes and ear buds.

## 2017-04-09 NOTE — ED Notes (Signed)
Spoke with PC updates given

## 2017-04-09 NOTE — ED Notes (Signed)
Pt provides name of friend, Gala RomneyDoug, number: [phone]. Pt states friend will not come to pick up patient, pt reports plan to take bus, reports having money to pay for bus. Pt difficult to arouse, falls asleep sitting upright in bed. Plan: allow pt to sleep in bed due to currently low census in ED. Will re-evaluate ability to ride bus in one hour or sooner if ED census rises.

## 2017-04-14 ENCOUNTER — Encounter (HOSPITAL_COMMUNITY): Payer: Self-pay | Admitting: Emergency Medicine

## 2017-04-14 ENCOUNTER — Emergency Department (HOSPITAL_COMMUNITY)
Admission: EM | Admit: 2017-04-14 | Discharge: 2017-04-14 | Disposition: A | Discharge status: Home / Self Care | Payer: Medicaid Other | Attending: Emergency Medicine | Admitting: Emergency Medicine

## 2017-04-14 ENCOUNTER — Emergency Department (HOSPITAL_COMMUNITY): Payer: Medicaid Other

## 2017-04-14 DIAGNOSIS — Y999 Unspecified external cause status: Secondary | ICD-10-CM | POA: Insufficient documentation

## 2017-04-14 DIAGNOSIS — Z23 Encounter for immunization: Secondary | ICD-10-CM | POA: Diagnosis not present

## 2017-04-14 DIAGNOSIS — F17210 Nicotine dependence, cigarettes, uncomplicated: Secondary | ICD-10-CM | POA: Insufficient documentation

## 2017-04-14 DIAGNOSIS — S00501A Unspecified superficial injury of lip, initial encounter: Secondary | ICD-10-CM | POA: Diagnosis present

## 2017-04-14 DIAGNOSIS — S01511A Laceration without foreign body of lip, initial encounter: Secondary | ICD-10-CM | POA: Diagnosis not present

## 2017-04-14 DIAGNOSIS — Y9389 Activity, other specified: Secondary | ICD-10-CM | POA: Diagnosis not present

## 2017-04-14 DIAGNOSIS — R51 Headache: Secondary | ICD-10-CM | POA: Insufficient documentation

## 2017-04-14 DIAGNOSIS — Y929 Unspecified place or not applicable: Secondary | ICD-10-CM | POA: Diagnosis not present

## 2017-04-14 MED ORDER — BACITRACIN ZINC 500 UNIT/GM EX OINT
1.0000 "application " | TOPICAL_OINTMENT | Freq: Once | CUTANEOUS | Status: AC
  Administered 2017-04-14: 1 via TOPICAL
  Filled 2017-04-14: qty 0.9

## 2017-04-14 MED ORDER — LORAZEPAM 2 MG/ML IJ SOLN
1.0000 mg | Freq: Once | INTRAMUSCULAR | Status: AC
  Administered 2017-04-14: 1 mg via INTRAMUSCULAR
  Filled 2017-04-14: qty 1

## 2017-04-14 MED ORDER — AMOXICILLIN-POT CLAVULANATE 875-125 MG PO TABS: 1.0000 | ORAL_TABLET | Freq: Two times a day (BID) | ORAL | 0 refills | Status: DC

## 2017-04-14 MED ORDER — HYDROCODONE-ACETAMINOPHEN 5-325 MG PO TABS: ORAL_TABLET | ORAL | 0 refills | Status: DC

## 2017-04-14 MED ORDER — AMOXICILLIN-POT CLAVULANATE 875-125 MG PO TABS
1.0000 | ORAL_TABLET | Freq: Two times a day (BID) | ORAL | Status: DC
  Administered 2017-04-14: 1 via ORAL
  Filled 2017-04-14: qty 1

## 2017-04-14 MED ORDER — LIDOCAINE HCL (PF) 1 % IJ SOLN
5.0000 mL | Freq: Once | INTRAMUSCULAR | Status: AC
  Administered 2017-04-14: 5 mL
  Filled 2017-04-14: qty 5

## 2017-04-14 MED ORDER — TETANUS-DIPHTH-ACELL PERTUSSIS 5-2.5-18.5 LF-MCG/0.5 IM SUSP
0.5000 mL | Freq: Once | INTRAMUSCULAR | Status: AC
Start: 2017-04-14 — End: 2017-04-14
  Administered 2017-04-14: 0.5 mL via INTRAMUSCULAR
  Filled 2017-04-14: qty 0.5
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
## 2017-04-14 NOTE — ED Notes (Signed)
ED Provider at bedside. 

## 2017-04-14 NOTE — ED Notes (Signed)
Contacted [facility] tx line for Dr. Zannie KehrBedlack
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
## 2017-04-14 NOTE — ED Notes (Signed)
Patient transported to CT 

## 2017-04-14 NOTE — Discharge Instructions (Signed)
Keep wound dry and do not remove dressing for 24 hours if possible. After that, wash gently morning and night (every 12 hours) with soap and water. Use a topical antibiotic ointment and cover with a bandaid or gauze.  °  °Do NOT use rubbing alcohol or hydrogen peroxide, do not soak the area °  °Present to your primary care doctor or the urgent care of your choice, or the ED for suture removal in 7-8 days. °  °Every attempt was made to remove foreign body (contaminants) from the wound.  However, there is always a chance that some may remain in the wound. This can  increase your risk of infection. °  °If you see signs of infection (warmth, redness, tenderness, pus, sharp increase in pain, fever, red streaking in the skin) immediately return to the emergency department. °  °After the wound heals fully, apply sunscreen for 6-12 months to minimize scarring.  ° °

## 2017-04-23 ENCOUNTER — Emergency Department (HOSPITAL_COMMUNITY)
Admission: EM | Admit: 2017-04-23 | Discharge: 2017-04-24 | Disposition: A | Discharge status: Home / Self Care | Payer: Medicaid Other | Attending: Physician Assistant | Admitting: Physician Assistant

## 2017-04-23 ENCOUNTER — Encounter (HOSPITAL_COMMUNITY): Payer: Self-pay

## 2017-04-23 DIAGNOSIS — Y9389 Activity, other specified: Secondary | ICD-10-CM | POA: Diagnosis not present

## 2017-04-23 DIAGNOSIS — Y999 Unspecified external cause status: Secondary | ICD-10-CM | POA: Diagnosis not present

## 2017-04-23 DIAGNOSIS — F1590 Other stimulant use, unspecified, uncomplicated: Secondary | ICD-10-CM | POA: Diagnosis not present

## 2017-04-23 DIAGNOSIS — W19XXXA Unspecified fall, initial encounter: Secondary | ICD-10-CM | POA: Diagnosis not present

## 2017-04-23 DIAGNOSIS — F1290 Cannabis use, unspecified, uncomplicated: Secondary | ICD-10-CM | POA: Diagnosis not present

## 2017-04-23 DIAGNOSIS — Y929 Unspecified place or not applicable: Secondary | ICD-10-CM | POA: Insufficient documentation

## 2017-04-23 DIAGNOSIS — F1910 Other psychoactive substance abuse, uncomplicated: Secondary | ICD-10-CM | POA: Diagnosis not present

## 2017-04-23 DIAGNOSIS — F17210 Nicotine dependence, cigarettes, uncomplicated: Secondary | ICD-10-CM | POA: Diagnosis not present

## 2017-04-23 DIAGNOSIS — R443 Hallucinations, unspecified: Secondary | ICD-10-CM | POA: Diagnosis not present

## 2017-04-23 DIAGNOSIS — F319 Bipolar disorder, unspecified: Secondary | ICD-10-CM | POA: Diagnosis not present

## 2017-04-23 DIAGNOSIS — Z046 Encounter for general psychiatric examination, requested by authority: Secondary | ICD-10-CM | POA: Diagnosis present

## 2017-04-23 LAB — COMPREHENSIVE METABOLIC PANEL
ALBUMIN: 4 g/dL (ref 3.5–5.0)
ALT: 32 U/L (ref 17–63)
AST: 31 U/L (ref 15–41)
Alkaline Phosphatase: 62 U/L (ref 38–126)
Anion gap: 7 (ref 5–15)
BUN: 13 mg/dL (ref 6–20)
CO2: 29 mmol/L (ref 22–32)
CREATININE: 0.86 mg/dL (ref 0.61–1.24)
Calcium: 9.1 mg/dL (ref 8.9–10.3)
Chloride: 104 mmol/L (ref 101–111)
GFR calc Af Amer: >60 mL/min (ref >60)
GFR calc non Af Amer: >60 mL/min (ref >60)
GLUCOSE: 81 mg/dL (ref 65–99)
POTASSIUM: 3.6 mmol/L (ref 3.5–5.1)
SODIUM: 140 mmol/L (ref 135–145)
Total Bilirubin: 0.4 mg/dL (ref 0.3–1.2)
Total Protein: 6.4 g/dL — ABNORMAL LOW (ref 6.5–8.1)

## 2017-04-23 LAB — CBC
HCT: 42.1 % (ref 39.0–52.0)
HEMOGLOBIN: 14.7 g/dL (ref 13.0–17.0)
MCH: 31.9 pg (ref 26.0–34.0)
MCHC: 34.9 g/dL (ref 30.0–36.0)
MCV: 91.3 fL (ref 78.0–100.0)
PLATELETS: 229 10*3/uL (ref 150–400)
RBC: 4.61 MIL/uL (ref 4.22–5.81)
RDW: 13.4 % (ref 11.5–15.5)
WBC: 10.8 10*3/uL — AB (ref 4.0–10.5)

## 2017-04-23 LAB — ACETAMINOPHEN LEVEL: Acetaminophen (Tylenol), Serum: <10 ug/mL — ABNORMAL LOW (ref 10–30)

## 2017-04-23 LAB — SALICYLATE LEVEL: Salicylate Lvl: <7 mg/dL (ref 2.8–30.0)

## 2017-04-23 LAB — ETHANOL: Alcohol, Ethyl (B): <5 mg/dL (ref <5)

## 2017-04-23 MED ORDER — ACETAMINOPHEN 325 MG PO TABS
650.0000 mg | ORAL_TABLET | ORAL | Status: DC | PRN
  Administered 2017-04-24: 650 mg via ORAL
  Filled 2017-04-23: qty 2

## 2017-04-23 MED ORDER — LORAZEPAM 2 MG/ML IJ SOLN: 0.0000 mg | Freq: Two times a day (BID) | INTRAMUSCULAR | Status: DC

## 2017-04-23 MED ORDER — THIAMINE HCL 100 MG/ML IJ SOLN: 100.0000 mg | Freq: Every day | INTRAMUSCULAR | Status: DC

## 2017-04-23 MED ORDER — ONDANSETRON HCL 4 MG PO TABS
4.0000 mg | ORAL_TABLET | Freq: Three times a day (TID) | ORAL | Status: DC | PRN
Start: 2017-04-23 — End: 2017-04-24

## 2017-04-23 MED ORDER — VITAMIN B-1 100 MG PO TABS: 100.0000 mg | ORAL_TABLET | Freq: Every day | ORAL | Status: DC

## 2017-04-23 MED ORDER — LORAZEPAM 1 MG PO TABS: 0.0000 mg | ORAL_TABLET | Freq: Four times a day (QID) | ORAL | Status: DC

## 2017-04-23 MED ORDER — ZOLPIDEM TARTRATE 5 MG PO TABS: 5.0000 mg | ORAL_TABLET | Freq: Every evening | ORAL | Status: DC | PRN

## 2017-04-23 MED ORDER — LORAZEPAM 2 MG/ML IJ SOLN: 0.0000 mg | Freq: Four times a day (QID) | INTRAMUSCULAR | Status: DC

## 2017-04-23 MED ORDER — LORAZEPAM 1 MG PO TABS: 0.0000 mg | ORAL_TABLET | Freq: Two times a day (BID) | ORAL | Status: DC

## 2017-04-23 NOTE — ED Notes (Signed)
TTS completed.  Pt asking for dinner tray.  Pt given [place] sandwich bag, peanut butter and crackers, and caffeine-free Coke.  Pt also asking for a change of scrubs and a shower.  Will engage available tech and coordinate this.
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
## 2017-04-23 NOTE — ED Notes (Signed)
Belongings kept at nurses' station until TTS calls back with a plan.

## 2017-04-23 NOTE — ED Notes (Signed)
Patient is asleep at this time.  ED Tech attempted to wake patient up, but he remained sleeping.

## 2017-04-23 NOTE — ED Provider Notes (Signed)
[facility]-EMERGENCY DEPT Provider Note   CSN: [id number] Arrival date & time: 04/23/17  1225  By signing my name below, I, Ny'Kea Lewis, attest that this documentation has been prepared under the direction and in the presence of Kevonna Nolte Lyn, *. Electronically Signed: Karren Cobble, ED Scribe. 04/23/17. 6:18 PM.  History   Chief Complaint Chief Complaint  Patient presents with  . Psychiatric Evaluation   The history is provided by the patient. No language interpreter was used.    HPI Comments: Wilkie Zenon is a 21 y.o. male with a history of ADHD, anxiety, bipolar disorder, depression, and mental disorder, brought in by ambulance, who presents to the Emergency Department for psychiatric evaluation. She reports audbile and visual hallucinations secondary to drug usage. He states he is looks for assistance with his substance abuse. Denies suicidal or homicidal ideations, or depression.   Pt also requesting removal of ssutures in his lip that were placed one week ago after a lip laceration that occurred on 04/14/17.  Past Medical History:  Diagnosis Date  . ADHD (attention deficit hyperactivity disorder)   . Anxiety   . Bipolar 1 disorder (HCC)   . Current smoker   . Depression   . Eating disorder   . Headache(784.0)   . History of ADHD 11/01/2015  . Medical history non-contributory   . Mental disorder   . Nicotine dependence 11/03/2015  . 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 Bipolar affective disorder (HCC) 03/18/2017  . Polysubstance abuse 03/18/2017  . Intentional drug overdose (HCC) 12/05/2016  . Acute encephalopathy 12/05/2016  . Sinus tachycardia 12/05/2016  . Hypotension 12/05/2016  . Overdose, intentional self-harm, initial encounter (HCC) 11/20/2016  . Intentional SSRI (selective serotonin reuptake inhibitor) overdose (HCC) 11/20/2016  . Substance induced mood disorder  (HCC) 09/08/2016  . Homelessness 09/02/2016  . Attention deficit hyperactivity disorder (ADHD) 07/03/2016  . 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 Tobacco use disorder 07/03/2016  . Unspecified depressive  disorder 07/03/2016  . 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 Dextromethorphan overdose 06/03/2016  . Leukocytosis   . Cannabis use disorder, moderate, dependence (HCC) 12/04/2012    History reviewed. No pertinent surgical history.  Home Medications    Prior to Admission medications   Medication Sig Start Date End Date Taking? Authorizing Provider  HYDROcodone-acetaminophen (NORCO/VICODIN) 5-325 MG tablet Take 1-2 tablets by mouth every 6 hours as needed for pain. Patient not taking: Reported on 04/23/2017 04/14/17   Pisciotta, Mardella Layman   Family History History reviewed. No pertinent family history.  Social History Social History  Substance Use Topics  . Smoking status: Current Some Day Smoker    Packs/day: 0.00    Years: 4.00    Types: Cigarettes  . Smokeless tobacco: Never Used  . Alcohol use No     Allergies   Patient has no known allergies.  Review of Systems Review of Systems  Skin: Positive for wound.       Laceration on lip that requires suture removal.  Psychiatric/Behavioral: Positive for hallucinations. Negative for suicidal ideas.  All other systems reviewed and are negative.   Physical Exam Updated Vital Signs BP 125/81 (BP Location: Left Arm)   Pulse 87   Temp 97.7 F (36.5 C) (Oral)   Resp 18   Ht 5\' 8"  (1.727 m)   Wt 155 lb (70.3 kg)   SpO2 100%   BMI 23.57 kg/m  Physical Exam  Constitutional: He is oriented to person, place, and time. He appears well-developed and well-nourished.  HENT:  Head: Normocephalic.  Eyes: EOM are normal.  Neck: Normal range of motion.  Pulmonary/Chest: Effort normal.  Abdominal: He exhibits no distension.  Musculoskeletal: Normal range of motion.  Neurological: He is alert and  oriented to person, place, and time.  Skin:  Stitches noted to the lip.   Psychiatric: He has a normal mood and affect.  Nursing note and vitals reviewed.  ED Treatments / Results  DIAGNOSTIC STUDIES: Oxygen Saturation is 100% on RA, normal by my interpretation.   COORDINATION OF CARE: 6:05 PM-Discussed next steps with pt. Pt verbalized understanding and is agreeable with the plan.   Labs (all labs ordered are listed, but only abnormal results are displayed) Labs Reviewed  COMPREHENSIVE METABOLIC PANEL - Abnormal; Notable for the following:       Result Value   Total Protein 6.4 (*)    All other components within normal limits  ACETAMINOPHEN LEVEL - Abnormal; Notable for the following:    Acetaminophen (Tylenol), Serum <10 (*)    All other components within normal limits  CBC - Abnormal; Notable for the following:    WBC 10.8 (*)    All other components within normal limits  ETHANOL  SALICYLATE LEVEL  RAPID URINE DRUG SCREEN, HOSP PERFORMED    EKG  EKG Interpretation None       Radiology No results found.  Procedures Procedures (including critical care time)  Medications Ordered in ED Medications - No data to display   Initial Impression / Assessment and Plan / ED Course  I have reviewed the triage vital signs and the nursing notes.  Pertinent labs & imaging results that were available during my care of the patient were reviewed by me and considered in my medical decision making (see chart for details).     I personally performed the services described in this documentation, which was scribed in my presence. The recorded information has been reviewed and is accurate.   Patient's 21 year old male presenting to the ED asking for psychiatric help. Patient states several, psychotic things such as "there is evil in me". States that the drugs she's been taking has been made him feel "crazy". Patient has a psychiatric history.We remove the sutures in his left lip.  Will have TTS consult on patient.  Final Clinical Impressions(s) / ED Diagnoses   Final diagnoses:  None    New Prescriptions New Prescriptions   No medications on file     Abelino DerrickMackuen, Kace Hartje Lyn, MD 04/23/17 1827

## 2017-04-23 NOTE — ED Triage Notes (Signed)
Pt reports not wanting to stay for substance abuse, pt wants to have sutures removed from L face, acuity changed accordingly

## 2017-04-23 NOTE — ED Notes (Signed)
TTS in progress.  Pt sleepy during TTS and this RN and NT had to yell and stimulate patient to get him to pay attention to TTS.  NT brought him ice water.

## 2017-04-23 NOTE — ED Notes (Signed)
Upon further evaluation pt is not appropriate for discharge. Will have patient placed in Pod F.

## 2017-04-23 NOTE — ED Triage Notes (Signed)
Pt escorted by Child psychotherapistsocial worker for psychiatric evaluation. Pt needs to be medically cleared to get into rehab facility. Pt alert and oriented x 4. SW states that pt's speech is slower today from drug use. Pt has psychiatric hx. Pt states "I want to get off of drugs so I can't go home" Denies SI/HI. VSS.

## 2017-04-23 NOTE — ED Notes (Signed)
While drawing patients blood, he kept asking if he could keep a couple of tubes because he was a vampire and wanted to drink blood.  Advised patient we needed to send tubes of blood to lab.  Patient kept asking for "extra tubes" to take home.

## 2017-04-24 DIAGNOSIS — F1490 Cocaine use, unspecified, uncomplicated: Secondary | ICD-10-CM | POA: Diagnosis not present

## 2017-04-24 DIAGNOSIS — F1590 Other stimulant use, unspecified, uncomplicated: Secondary | ICD-10-CM | POA: Diagnosis not present

## 2017-04-24 DIAGNOSIS — F1910 Other psychoactive substance abuse, uncomplicated: Secondary | ICD-10-CM

## 2017-04-24 DIAGNOSIS — F17210 Nicotine dependence, cigarettes, uncomplicated: Secondary | ICD-10-CM

## 2017-04-24 DIAGNOSIS — F1390 Sedative, hypnotic, or anxiolytic use, unspecified, uncomplicated: Secondary | ICD-10-CM

## 2017-04-24 DIAGNOSIS — F319 Bipolar disorder, unspecified: Secondary | ICD-10-CM | POA: Diagnosis not present

## 2017-04-24 DIAGNOSIS — F1290 Cannabis use, unspecified, uncomplicated: Secondary | ICD-10-CM | POA: Diagnosis not present

## 2017-04-24 LAB — RAPID URINE DRUG SCREEN, HOSP PERFORMED
Amphetamines: NOT DETECTED
BARBITURATES: NOT DETECTED
BENZODIAZEPINES: NOT DETECTED
COCAINE: NOT DETECTED
OPIATES: NOT DETECTED
Tetrahydrocannabinol: NOT DETECTED

## 2017-04-24 NOTE — Progress Notes (Signed)
Ferne ReusJustina Okonkwo, NP and Kriste BasqueBecky, RN informed CSW that patient is refusing to participate in phone interview with [facility].  CSW contacted patient's care coordinator with [facility] team, Sharman CrateLaurie Arena [phone]). Jacki ConesLaurie stated that she can reschedule phone interview once patient is willing to to seek help again.    CSW will continue following for patient's disposition.   Baldo DaubJolan Jiyaan Steinhauser MSW, LCSWA CSW Disposition [phone]

## 2017-04-24 NOTE — ED Notes (Signed)
Pt on phone w/ - states w/his mother.

## 2017-04-24 NOTE — BH Assessment (Deleted)
CSW spoke with patient's Care Coordinator with [facility] ACTT team, Sharman CrateLaurie Arena [phone]).   Per Jacki ConesLaurie, the patient's ACTT team has been working on placing the patient at [facility] for substance use treatment. Jacki ConesLaurie stated that she feels that if the patient  is offered a bed at [facility] for inpatient treatment, it would be the most beneficial. .   Patient has a phone interview/assessment today until 1:30pm with Vonna KotykJay, a counselor at [facility].   CSW informed patient's nurse, Kriste BasqueBecky, RN and she agreed to contact CSW once interview is complete for an update.   CSW notified Ferne ReusJustina Okonkwo, NP of possible placement at [facility] and she agreed that their services would be beneficial for patient.   CSW will continue to follow for further information on placement at [facility] and disposition from hospital.    Baldo DaubJolan Kiyla Ringler MSW, LCSWA CSW Disposition [phone]

## 2017-04-24 NOTE — ED Notes (Signed)
Telepsych being performed. 

## 2017-04-24 NOTE — Progress Notes (Signed)
Per Ferne ReusJustina Okonkwo, NP patient does not meet criteria for inpatient treatment. Recommended for D/C.   Patient is to follow up with his ACTT team with [facility].   Per Rachael Darbyiana Bost [phone]) with [facility], she can pick patient up now.   Kriste BasqueBecky, RN notified.    Baldo DaubJolan Laveah Gloster MSW, LCSWA CSW Disposition [phone]

## 2017-04-24 NOTE — ED Notes (Signed)
Pt returned from showering. Aware, per Kathrin GreathouseJolan, [facility] to have [facility] interview w/Jay @ [facility] - [phone]. Jolan advised to call her afterward.

## 2017-04-24 NOTE — ED Notes (Signed)
Patient has  One belongings bag w/ A Pair Shoes, 1 pants, and 1 Shirt, 2 necklaces [place] a cup [place]  [place]  , and 1 Valuable Envelope taken to Security.

## 2017-04-24 NOTE — ED Notes (Signed)
I more bag added to locker w/ Shirt, Sweater, Pants, and Shorts.
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
## 2017-04-24 NOTE — ED Notes (Addendum)
Pt's belongings x 1 labeled belongings bag at nurses' desk - needs to be inventoried - per report. Urine specimen cup placed at bedside d/t need specimen.

## 2017-04-24 NOTE — ED Notes (Signed)
RN spoke w/pt at length in his exam room d/t pt noted to be restless - continues to ask to leave. States he has a job interview and that he spoke w/his mother on phone and all is good w/them. Asked pt to wait for telepsych to be performed and advised him of proper use for ED d/t pt admits to coming to ED to sleep, shower, then demands to leave the next day.

## 2017-04-24 NOTE — ED Notes (Signed)
Vonna KotykJay called back from [facility] and is aware pt refused referral - States to please call them back at any time if pt returns and changes his mind.

## 2017-04-24 NOTE — ED Notes (Signed)
Pt aware RN left a message for Thomas [name] - [facility] [phone]- [phone] - and that he is scheduled to have a phone interview at 1300. Pt states he just wants to leave. Debbora PrestoJustina, NP, [facility], and Jolan, SW, aware.

## 2017-04-24 NOTE — ED Notes (Signed)
Pt continuously ambulating to nurses' desk - asking for multiple different things - example: breakfast - discussed delay w/breakfast x 3; shower - discussed delay w/taking shower x 4; pen/paper so he may write down the songs he - given.

## 2017-04-24 NOTE — ED Notes (Signed)
Pt continues to state he wants to leave. Dr Juleen ChinaKohut aware - d/c paperwork received.

## 2017-04-24 NOTE — ED Notes (Signed)
Pt ambulatory to nurses' desk - stating he wants to leave and no longer wants detox at this time.

## 2017-04-24 NOTE — Progress Notes (Signed)
CSW spoke with [facility] worker about pt. [facility] worker wanted an update about pt's progress and if pt was being admitted or not. CSW informed [facility] worker that pt was only being held in the ED at this time, but inpatient treatment was being sought for pt.    Claude MangesKierra S. Lashon Hillier, MSW, LCSW-A Emergency Department Clinical Social Worker [phone]

## 2017-04-24 NOTE — ED Notes (Signed)
A Regular Diet was ordered for Lunch, Patient was given a Drink.

## 2017-04-24 NOTE — Progress Notes (Signed)
CSW spoke with patient's Care Coordinator with [facility] team, Laurie Arena ([phone]).   Per Laurie, the patient's ACTT team has been working on placing the patient at [facility] for substance use treatment. Laurie stated that she feels that if the patient  is offered a bed at [facility] for inpatient treatment, it would be the most beneficial. .   Patient has a phone interview/assessment today until 1:30pm with Jay, a counselor at [facility].   CSW informed patient's nurse, Becky, RN and she agreed to contact CSW once interview is complete for an update.   CSW notified Justina Okonkwo, NP of possible placement at [facility] and she agreed that their services would be beneficial for patient.   CSW will continue to follow for further information on placement at [facility] and disposition from hospital.    Lleyton Byers MSW, LCSWA CSW Disposition [phone]  

## 2017-04-24 NOTE — ED Notes (Signed)
Pt awake; given food and tylenol for pain.  Pt asking if he can have a sitter so he can have someone to talk to.  Informed him that the hospital was short sitters; asked if he could wander and talk to people; told him it was four in the morning and it was time to sleep.  Pt given new scrubs to change into; wants to take a shower; informed of time of day again; told we tried to wake him up to take him to the shower and he would not wake up.  Told pt he could take a shower in the AM.

## 2017-05-01 ENCOUNTER — Encounter (HOSPITAL_COMMUNITY): Payer: Self-pay | Admitting: Emergency Medicine

## 2017-05-01 ENCOUNTER — Emergency Department (HOSPITAL_COMMUNITY)
Admission: EM | Admit: 2017-05-01 | Discharge: 2017-05-01 | Disposition: A | Discharge status: Home / Self Care | Payer: Medicaid Other | Attending: Emergency Medicine | Admitting: Emergency Medicine

## 2017-05-01 DIAGNOSIS — F17210 Nicotine dependence, cigarettes, uncomplicated: Secondary | ICD-10-CM | POA: Insufficient documentation

## 2017-05-01 DIAGNOSIS — F309 Manic episode, unspecified: Secondary | ICD-10-CM | POA: Diagnosis present

## 2017-05-01 DIAGNOSIS — Z79899 Other long term (current) drug therapy: Secondary | ICD-10-CM | POA: Insufficient documentation

## 2017-05-01 DIAGNOSIS — Z9114 Patient's other noncompliance with medication regimen: Secondary | ICD-10-CM | POA: Insufficient documentation

## 2017-05-01 DIAGNOSIS — R45850 Homicidal ideations: Secondary | ICD-10-CM | POA: Diagnosis not present

## 2017-05-01 DIAGNOSIS — Z046 Encounter for general psychiatric examination, requested by authority: Secondary | ICD-10-CM | POA: Insufficient documentation

## 2017-05-01 DIAGNOSIS — F14180 Cocaine abuse with cocaine-induced anxiety disorder: Secondary | ICD-10-CM

## 2017-05-01 DIAGNOSIS — F1910 Other psychoactive substance abuse, uncomplicated: Secondary | ICD-10-CM | POA: Diagnosis not present

## 2017-05-01 LAB — COMPREHENSIVE METABOLIC PANEL
ALBUMIN: 4.1 g/dL (ref 3.5–5.0)
ALT: 50 U/L (ref 17–63)
ANION GAP: 12 (ref 5–15)
AST: 134 U/L — AB (ref 15–41)
Alkaline Phosphatase: 61 U/L (ref 38–126)
BUN: 13 mg/dL (ref 6–20)
CALCIUM: 9.4 mg/dL (ref 8.9–10.3)
CO2: 24 mmol/L (ref 22–32)
CREATININE: 1.05 mg/dL (ref 0.61–1.24)
Chloride: 103 mmol/L (ref 101–111)
GFR calc Af Amer: >60 mL/min (ref >60)
GFR calc non Af Amer: >60 mL/min (ref >60)
GLUCOSE: 135 mg/dL — AB (ref 65–99)
Potassium: 3.4 mmol/L — ABNORMAL LOW (ref 3.5–5.1)
SODIUM: 139 mmol/L (ref 135–145)
Total Bilirubin: 0.4 mg/dL (ref 0.3–1.2)
Total Protein: 7 g/dL (ref 6.5–8.1)

## 2017-05-01 LAB — RAPID URINE DRUG SCREEN, HOSP PERFORMED
Amphetamines: NOT DETECTED
BARBITURATES: NOT DETECTED
Benzodiazepines: NOT DETECTED
COCAINE: POSITIVE — AB
Opiates: NOT DETECTED
TETRAHYDROCANNABINOL: POSITIVE — AB

## 2017-05-01 LAB — CBC
HEMATOCRIT: 41.1 % (ref 39.0–52.0)
HEMOGLOBIN: 14.8 g/dL (ref 13.0–17.0)
MCH: 32.2 pg (ref 26.0–34.0)
MCHC: 36 g/dL (ref 30.0–36.0)
MCV: 89.3 fL (ref 78.0–100.0)
Platelets: 211 10*3/uL (ref 150–400)
RBC: 4.6 MIL/uL (ref 4.22–5.81)
RDW: 13.2 % (ref 11.5–15.5)
WBC: 14 10*3/uL — ABNORMAL HIGH (ref 4.0–10.5)

## 2017-05-01 LAB — ACETAMINOPHEN LEVEL

## 2017-05-01 LAB — ETHANOL: Alcohol, Ethyl (B): <5 mg/dL (ref <5)

## 2017-05-01 LAB — SALICYLATE LEVEL: Salicylate Lvl: <7 mg/dL (ref 2.8–30.0)

## 2017-05-01 MED ORDER — POTASSIUM CHLORIDE CRYS ER 20 MEQ PO TBCR
40.0000 meq | EXTENDED_RELEASE_TABLET | Freq: Once | ORAL | Status: AC
  Administered 2017-05-01: 40 meq via ORAL
  Filled 2017-05-01: qty 2

## 2017-05-01 MED ORDER — BUPROPION HCL ER (XL) 150 MG PO TB24
150.0000 mg | ORAL_TABLET | Freq: Every day | ORAL | Status: DC
  Administered 2017-05-01: 150 mg via ORAL
  Filled 2017-05-01: qty 1

## 2017-05-01 MED ORDER — ONDANSETRON HCL 4 MG PO TABS: 4.0000 mg | ORAL_TABLET | Freq: Three times a day (TID) | ORAL | Status: DC | PRN

## 2017-05-01 MED ORDER — NICOTINE 14 MG/24HR TD PT24
14.0000 mg | MEDICATED_PATCH | Freq: Every day | TRANSDERMAL | Status: DC
  Administered 2017-05-01: 14 mg via TRANSDERMAL
  Filled 2017-05-01: qty 1

## 2017-05-01 MED ORDER — IBUPROFEN 200 MG PO TABS: 600.0000 mg | ORAL_TABLET | Freq: Three times a day (TID) | ORAL | Status: DC | PRN

## 2017-05-01 MED ORDER — AMPHETAMINE-DEXTROAMPHET ER 10 MG PO CP24: 30.0000 mg | ORAL_CAPSULE | Freq: Every day | ORAL | Status: DC

## 2017-05-01 MED ORDER — ZOLPIDEM TARTRATE 5 MG PO TABS: 5.0000 mg | ORAL_TABLET | Freq: Every evening | ORAL | Status: DC | PRN

## 2017-05-01 MED ORDER — ALUM & MAG HYDROXIDE-SIMETH 200-200-20 MG/5ML PO SUSP: 30.0000 mL | Freq: Four times a day (QID) | ORAL | Status: DC | PRN

## 2017-05-01 NOTE — ED Notes (Signed)
ED Provider at bedside. Dr. glick 

## 2017-05-01 NOTE — ED Triage Notes (Signed)
Pt reports help for substance abuse and also reports homicidal ideation.

## 2017-05-01 NOTE — ED Notes (Signed)
Pt came after doing cocaine and smoking weed; pt sts he wants to make a change and is requesting rehab and snacks

## 2017-05-01 NOTE — ED Provider Notes (Signed)
[facility]-EMERGENCY DEPT Provider Note   CSN: [id number] Arrival date & time: 05/01/17  0150  By signing my name below, I, Ny'Kea Lewis, attest that this documentation has been prepared under the direction and in the presence of Dione BoozeGlick, Aleksandr Pellow, MD. Electronically Signed: Karren CobbleNy'Kea Lewis, ED Scribe. 05/01/17. 3:35 AM.  History   Chief Complaint Chief Complaint  Patient presents with  . Addiction Problem  . Homicidal   The history is provided by the patient. No language interpreter was used.    HPI Comments: Thomas [name] is a 21 y.o. male with a history of ADHD, anxiety, bipolar 1 disorder, dpression, who presents to the Emergency Department for assistance with gradaully worsening drug addiction. Pt reports he has been unable to take his Aderrall due to financial constraints.  Instead,  he has been using marijuana and reports he has become addicted and would like assistance getting back on his medication. He reports he is complaint with his Wellbutrin. Endorses smoking one pack of cigarettes a day and occasional alcohol usage. Denies audible / visual hallucinations or homicidal /suicidal ideation.   Past Medical History:  Diagnosis Date  . ADHD (attention deficit hyperactivity disorder)   . Anxiety   . Bipolar 1 disorder (HCC)   . Current smoker   . Depression   . Eating disorder   . Headache(784.0)   . History of ADHD 11/01/2015  . Medical history non-contributory   . Mental disorder   . Nicotine dependence 11/03/2015  . 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 Bipolar affective disorder (HCC) 03/18/2017  . Polysubstance abuse 03/18/2017  . Intentional drug overdose (HCC) 12/05/2016  . Acute encephalopathy 12/05/2016  . Sinus tachycardia 12/05/2016  . Hypotension 12/05/2016  . Overdose, intentional self-harm, initial encounter (HCC) 11/20/2016  . Intentional SSRI (selective serotonin reuptake  inhibitor) overdose (HCC) 11/20/2016  . Substance induced mood disorder (HCC) 09/08/2016  . Homelessness 09/02/2016  . Attention deficit hyperactivity disorder (ADHD) 07/03/2016  . 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 Tobacco use disorder 07/03/2016  . Unspecified depressive  disorder 07/03/2016  . 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 Dextromethorphan overdose 06/03/2016  . Leukocytosis   . Cannabis use disorder, moderate, dependence (HCC) 12/04/2012   History reviewed. No pertinent surgical history.  Home Medications    Prior to Admission medications   Medication Sig Start Date End Date Taking? Authorizing Provider  HYDROcodone-acetaminophen (NORCO/VICODIN) 5-325 MG tablet Take 1-2 tablets by mouth every 6 hours as needed for pain. Patient not taking: Reported on 04/23/2017 04/14/17   Pisciotta, Mardella LaymanNicole, PA-C   Family History History reviewed. No pertinent family history.  Social History Social History  Substance Use Topics  . Smoking status: Current Some Day Smoker    Packs/day: 0.00    Years: 4.00    Types: Cigarettes  . Smokeless tobacco: Never Used  . Alcohol use No    Allergies   Patient has no known allergies.  Review of Systems Review of Systems  Psychiatric/Behavioral: Negative for hallucinations and suicidal ideas.  All other systems reviewed and are negative.  Physical Exam Updated Vital Signs BP (!) 143/81 (BP Location: Right Arm)   Pulse 100   Temp 97.6 F (36.4 C) (Oral)   Resp 18   Ht 5\' 6"  (1.676 m)   Wt 155 lb (70.3 kg)   SpO2 98%   BMI 25.02 kg/m   Physical Exam  Constitutional: He is oriented  to person, place, and time. He appears well-developed and well-nourished.  HENT:  Head: Normocephalic and atraumatic.  Eyes: Pupils are equal, round, and reactive to light. EOM are normal.  Neck: Normal range of motion. Neck supple. No JVD present.  Cardiovascular: Normal rate, regular rhythm and normal heart sounds.   No  murmur heard. Pulmonary/Chest: Effort normal and breath sounds normal. He has no wheezes. He has no rales. He exhibits no tenderness.  Abdominal: Soft. Bowel sounds are normal. He exhibits no distension and no mass. There is no tenderness.  Musculoskeletal: Normal range of motion. He exhibits no edema.  Lymphadenopathy:    He has no cervical adenopathy.  Neurological: He is alert and oriented to person, place, and time. No cranial nerve deficit. He exhibits normal muscle tone. Coordination normal.  Skin: Skin is warm and dry. No rash noted.  Psychiatric:  Flat affect with somewhat rambling speech.   Nursing note and vitals reviewed.  ED Treatments / Results  DIAGNOSTIC STUDIES: Oxygen Saturation is 98% on RA, normal by my interpretation.   COORDINATION OF CARE: 2:49 AM-Discussed next steps with pt. Pt verbalized understanding and is agreeable with the plan.   Labs (all labs ordered are listed, but only abnormal results are displayed) Labs Reviewed  COMPREHENSIVE METABOLIC PANEL - Abnormal; Notable for the following:       Result Value   Potassium 3.4 (*)    Glucose, Bld 135 (*)    AST 134 (*)    All other components within normal limits  ACETAMINOPHEN LEVEL - Abnormal; Notable for the following:    Acetaminophen (Tylenol), Serum <10 (*)    All other components within normal limits  CBC - Abnormal; Notable for the following:    WBC 14.0 (*)    All other components within normal limits  RAPID URINE DRUG SCREEN, HOSP PERFORMED - Abnormal; Notable for the following:    Cocaine POSITIVE (*)    Tetrahydrocannabinol POSITIVE (*)    All other components within normal limits  ETHANOL  SALICYLATE LEVEL   Procedures Procedures (including critical care time)  Medications Ordered in ED Medications  nicotine (NICODERM CQ - dosed in mg/24 hours) patch 14 mg (not administered)  alum & mag hydroxide-simeth (MAALOX/MYLANTA) 200-200-20 MG/5ML suspension 30 mL (not administered)    ondansetron (ZOFRAN) tablet 4 mg (not administered)  zolpidem (AMBIEN) tablet 5 mg (not administered)  ibuprofen (ADVIL,MOTRIN) tablet 600 mg (not administered)  buPROPion (WELLBUTRIN XL) 24 hr tablet 150 mg (not administered)  amphetamine-dextroamphetamine (ADDERALL XR) 24 hr capsule 30 mg (not administered)     Initial Impression / Assessment and Plan / ED Course  I have reviewed the triage vital signs and the nursing notes.  Pertinent lab results that were available during my care of the patient were reviewed by me and considered in my medical decision making (see chart for details).  Patient with known history of substance abuse disorder and bipolar disorder comes in with symptoms of mania with rambling speech and not sleeping for several days. TTS consultation is appreciated. They do not feel he meets inpatient criteria. However, I am concerned about his mania. He will be held for psychiatric evaluation.  Final Clinical Impressions(s) / ED Diagnoses   Final diagnoses:  Mania (HCC)  Substance abuse   New Prescriptions New Prescriptions   No medications on file   I personally performed the services described in this documentation, which was scribed in my presence. The recorded information has been reviewed and is accurate.  Dione Booze, MD 05/02/17 [phone]

## 2017-05-01 NOTE — BH Assessment (Signed)
[facility] Assessment Progress Note  Per Thedore MinsMojeed Akintayo, MD, this pt does not require psychiatric hospitalization at this time.  [facility] to continue treatment with the [facility], his current outpatient provider.  This has been included in pt's discharge instructions.  Pt's nurse, Diane, has been notified.  At 10:01 this writer called the [facility] to arrange for them to pick pt up.  They agree to have someone call me back.  Return call is pending as of this writing.  Thomas Canninghomas Aleene Swanner, MA Triage Specialist [phone]

## 2017-05-01 NOTE — [facility] Suicide Risk Assessment (Signed)
Suicide Risk Assessment  Discharge Assessment   [facility]   Principal Problem: Cocaine abuse with cocaine-induced anxiety disorder [facility](HCC) Discharge Diagnoses:  Patient Active Problem List   Diagnosis Date Noted  . Cocaine abuse with cocaine-induced anxiety disorder [facility](HCC) [F14.180] 05/01/2017    Priority: High  . Polysubstance abuse [F19.10] 03/18/2017    Priority: High  . Intentional drug overdose (HCC) [T50.902A] 12/05/2016  . Acute encephalopathy [G93.40] 12/05/2016  . Sinus tachycardia [R00.0] 12/05/2016  . Hypotension [I95.9] 12/05/2016  . Overdose, intentional self-harm, initial encounter (HCC) [T50.902A] 11/20/2016  . Intentional SSRI (selective serotonin reuptake inhibitor) overdose (HCC) [T43.222A] 11/20/2016  . Homelessness [Z59.0] 09/02/2016  . Attention deficit hyperactivity disorder (ADHD) [F90.9] 07/03/2016  . 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 Tobacco use disorder [F17.200] 07/03/2016  . Unspecified depressive  disorder [F32.9] 07/03/2016  . 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 Dextromethorphan overdose [T48.3X1A] 06/03/2016  . Leukocytosis [D72.829]   . Cannabis use disorder, moderate, dependence (HCC) [F12.20] 12/04/2012    Total Time spent with patient: 45 minutes  Musculoskeletal: Strength & Muscle Tone: within normal limits Gait & Station: normal Patient leans: N/A  Psychiatric Specialty Exam:   Blood pressure 119/68, pulse 89, temperature 98.3 F (36.8 C), temperature source Oral, resp. rate 16, height 5\' 6"  (1.676 m), weight 70.3 kg (155 lb), SpO2 100 %.Body mass index is 25.02 kg/m.  General Appearance: Casual  Eye Contact::  Good  Speech:  Normal Rate409  Volume:  Normal  Mood:  Anxious, mild  Affect:  Congruent  Thought Process:  Coherent and Descriptions of Associations: Intact  Orientation:  Full (Time, Place, and Person)  Thought Content:  WDL and Logical  Suicidal  Thoughts:  No  Homicidal Thoughts:  No  Memory:  Immediate;   Good Recent;   Good Remote;   Good  Judgement:  Fair  Insight:  Fair  Psychomotor Activity:  Normal  Concentration:  Good  Recall:  Good  Fund of Knowledge:Fair  Language: Good  Akathisia:  No  Handed:  Right  AIMS (if indicated):     Assets:  Leisure Time Physical Health Resilience Social Support  Sleep:     Cognition: WNL  ADL's:  Intact   Mental Status Per Nursing Assessment::   On Admission:   cocaine abuse, requesting inpatient hospitalization for drug use--predominantly stimulants.  He is focused on getting Adderall which he will not be getting due to his substance abuse.  This was explained to him but he continued to argue.  He wants to be placed in a facility until August 3 when he gets his first disability check but no suicidal/homicidal ideations, hallucinations, or withdrawal symptoms.  Stable for discharge, ACT team ([facility]) notified for pick up.  Demographic Factors:  Male, Adolescent or young adult and Caucasian  Loss Factors: NA  Historical Factors: NA  Risk Reduction Factors:   Sense of responsibility to family, Positive social support and Positive therapeutic relationship  Continued Clinical Symptoms:  Anxiety, mild  Cognitive Features That Contribute To Risk:  None    Suicide Risk:  Minimal: No identifiable suicidal ideation.  Patients presenting with no risk factors but with morbid ruminations; may be classified as minimal risk based on the severity of the depressive symptoms    Plan Of Care/Follow-up recommendations:  Activity:  as tolerated Diet:  heart healthy diet  Donovan Persley, NP 05/01/2017, 10:32 AM

## 2017-05-01 NOTE — Discharge Instructions (Addendum)
For your behavioral health needs, you are advised to continue treatment with the [facility]: ° °     [facility] °     [facility], [address] °     [address] °     [place],   [address] °     [phone] °     Crisis number: [phone] °

## 2017-05-01 NOTE — ED Notes (Signed)
Pt discharged home. Discharged instructions read to pt who verbalized understanding. All belongings returned to pt who signed for same. Pt expressed desire to be discharged and to wait in the lobby for his ACT team to pick him up. He reports using cocaine as his problem and he was drug seeking on the unit. Denies SI/HI, is not delusional and not responding to internal stimuli. Escorted pt to the ED exit.

## 2017-05-01 NOTE — ED Notes (Signed)
Urine specimen requested 
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
## 2017-05-01 NOTE — ED Notes (Signed)
Patient reports that he needs to get his head straight and get off of drugs. States he was having some homicidal thoughts toward mean people out in the world but no specific person. Smiling during the whole conversation. Very childlike. Searched for contraband and nothing found. Went over basic rules of unit.

## 2017-05-06 ENCOUNTER — Emergency Department (HOSPITAL_COMMUNITY)
Admission: EM | Admit: 2017-05-06 | Discharge: 2017-05-07 | Disposition: A | Discharge status: Home / Self Care | Payer: Medicaid Other | Attending: Emergency Medicine | Admitting: Emergency Medicine

## 2017-05-06 DIAGNOSIS — F909 Attention-deficit hyperactivity disorder, unspecified type: Secondary | ICD-10-CM | POA: Insufficient documentation

## 2017-05-06 DIAGNOSIS — F14180 Cocaine abuse with cocaine-induced anxiety disorder: Secondary | ICD-10-CM | POA: Diagnosis present

## 2017-05-06 DIAGNOSIS — F1910 Other psychoactive substance abuse, uncomplicated: Secondary | ICD-10-CM | POA: Diagnosis not present

## 2017-05-06 DIAGNOSIS — F17210 Nicotine dependence, cigarettes, uncomplicated: Secondary | ICD-10-CM | POA: Insufficient documentation

## 2017-05-06 DIAGNOSIS — F319 Bipolar disorder, unspecified: Secondary | ICD-10-CM | POA: Diagnosis not present

## 2017-05-06 LAB — ETHANOL

## 2017-05-06 LAB — COMPREHENSIVE METABOLIC PANEL
ALK PHOS: 57 U/L (ref 38–126)
ALT: 91 U/L — AB (ref 17–63)
AST: 118 U/L — ABNORMAL HIGH (ref 15–41)
Albumin: 4.5 g/dL (ref 3.5–5.0)
Anion gap: 9 (ref 5–15)
BILIRUBIN TOTAL: 0.5 mg/dL (ref 0.3–1.2)
BUN: 13 mg/dL (ref 6–20)
CALCIUM: 9.5 mg/dL (ref 8.9–10.3)
CHLORIDE: 104 mmol/L (ref 101–111)
CO2: 27 mmol/L (ref 22–32)
CREATININE: 0.91 mg/dL (ref 0.61–1.24)
Glucose, Bld: 80 mg/dL (ref 65–99)
Potassium: 3.9 mmol/L (ref 3.5–5.1)
Sodium: 140 mmol/L (ref 135–145)
TOTAL PROTEIN: 7.6 g/dL (ref 6.5–8.1)

## 2017-05-06 LAB — CBC
HCT: 42.8 % (ref 39.0–52.0)
Hemoglobin: 15.5 g/dL (ref 13.0–17.0)
MCH: 33 pg (ref 26.0–34.0)
MCHC: 36.2 g/dL — AB (ref 30.0–36.0)
MCV: 91.1 fL (ref 78.0–100.0)
PLATELETS: 222 10*3/uL (ref 150–400)
RBC: 4.7 MIL/uL (ref 4.22–5.81)
RDW: 13.3 % (ref 11.5–15.5)
WBC: 12.2 10*3/uL — ABNORMAL HIGH (ref 4.0–10.5)

## 2017-05-06 LAB — RAPID URINE DRUG SCREEN, HOSP PERFORMED
Amphetamines: NOT DETECTED
Barbiturates: NOT DETECTED
Benzodiazepines: NOT DETECTED
Cocaine: POSITIVE — AB
OPIATES: NOT DETECTED
Tetrahydrocannabinol: POSITIVE — AB

## 2017-05-06 LAB — SALICYLATE LEVEL

## 2017-05-06 LAB — ACETAMINOPHEN LEVEL: Acetaminophen (Tylenol), Serum: <10 ug/mL — ABNORMAL LOW (ref 10–30)

## 2017-05-06 MED ORDER — ALUM & MAG HYDROXIDE-SIMETH 200-200-20 MG/5ML PO SUSP: 30.0000 mL | Freq: Four times a day (QID) | ORAL | Status: DC | PRN

## 2017-05-06 MED ORDER — IBUPROFEN 200 MG PO TABS: 600.0000 mg | ORAL_TABLET | Freq: Three times a day (TID) | ORAL | Status: DC | PRN

## 2017-05-06 MED ORDER — ZOLPIDEM TARTRATE 5 MG PO TABS
5.0000 mg | ORAL_TABLET | Freq: Every evening | ORAL | Status: DC | PRN
  Administered 2017-05-06: 5 mg via ORAL
  Filled 2017-05-06: qty 1

## 2017-05-06 MED ORDER — NICOTINE 21 MG/24HR TD PT24: 21.0000 mg | MEDICATED_PATCH | Freq: Every day | TRANSDERMAL | Status: DC

## 2017-05-06 MED ORDER — ONDANSETRON HCL 4 MG PO TABS: 4.0000 mg | ORAL_TABLET | Freq: Three times a day (TID) | ORAL | Status: DC | PRN

## 2017-05-06 NOTE — ED Notes (Signed)
Pt. Transferred to [facility] from ED to room after screening for contraband. Report to include Situation, Background, Assessment and [facility]. Pt. Oriented to unit including Q15 minute rounds as well as the security cameras for their protection. Patient is alert and oriented, warm and dry in no acute distress. Patient denies SI, HI, and AVH. Pt. Encouraged to let me know if needs arise.

## 2017-05-06 NOTE — ED Notes (Signed)
Hourly rounding reveals patient sleeping in room. No complaints, stable, in no acute distress. Q15 minute rounds and monitoring via Security Cameras to continue. 

## 2017-05-06 NOTE — ED Notes (Signed)
Hourly rounding reveals patient in shower room. No complaints, stable, in no acute distress. Q15 minute rounds and monitoring via Security Cameras to continue.  

## 2017-05-06 NOTE — ED Triage Notes (Signed)
Per EMS. Pt went to the mall with a friend after using cocaine, heroin, and delsum cough syrup earlier today. Friend left pt, and mall security was called. Pt told mall security and EMS he was suicidal.

## 2017-05-06 NOTE — ED Provider Notes (Signed)
[facility]-EMERGENCY DEPT Provider Note   CSN: [id number] Arrival date & time: 05/06/17  1719     History   Chief Complaint Chief Complaint  Patient presents with  . Suicidal    HPI Thomas OliphantCaleb Vallery RidgeDillon Wysocki is a 21 y.o. male.  Level V caveat for psychiatric disorder. Patient [place] bizarrely at the mall. Mall security called EMS. Patient was transported to [facility]. He has an extensive mental health history including crack cocaine, ADHD, bipolar 1, polysubstance abuse. He was evaluated in emergency department on 05/01/17 and discharged.  No frank suicidal or homicidal ideation.      Past Medical History:  Diagnosis Date  . ADHD (attention deficit hyperactivity disorder)   . Anxiety   . Bipolar 1 disorder (HCC)   . Current smoker   . Depression   . Eating disorder   . Headache(784.0)   . History of ADHD 11/01/2015  . Medical history non-contributory   . Mental disorder   . Nicotine dependence 11/03/2015  . 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 Polysubstance abuse 03/18/2017  . Intentional drug overdose (HCC) 12/05/2016  . Acute encephalopathy 12/05/2016  . Sinus tachycardia 12/05/2016  . Hypotension 12/05/2016  . Overdose, intentional self-harm, initial encounter (HCC) 11/20/2016  . Intentional SSRI (selective serotonin reuptake inhibitor) overdose (HCC) 11/20/2016  . Homelessness 09/02/2016  . Attention deficit hyperactivity disorder (ADHD) 07/03/2016  . 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 Tobacco use disorder 07/03/2016  . Unspecified depressive  disorder 07/03/2016  . 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 Dextromethorphan overdose 06/03/2016  . Leukocytosis   . Cannabis use disorder, moderate, dependence (HCC) 12/04/2012    No past surgical history on file.     Home Medications     Prior to Admission medications   Not on File    Family History No family history on file.  Social History Social History  Substance Use Topics  . Smoking status: Current Some Day Smoker    Packs/day: 0.00    Years: 4.00    Types: Cigarettes  . Smokeless tobacco: Never Used  . Alcohol use No     Allergies   Patient has no known allergies.   Review of Systems Review of Systems  Reason unable to perform ROS: Psychiatric illness.     Physical Exam Updated Vital Signs BP 130/79 (BP Location: Left Arm)   Pulse 89   Temp 98.1 F (36.7 C) (Oral)   Resp 18   SpO2 98%   Physical Exam  Constitutional: He is oriented to person, place, and time.  Staring blankly at the ceiling  HENT:  Head: Normocephalic and atraumatic.  Eyes: Conjunctivae are normal.  Neck: Neck supple.  Cardiovascular: Normal rate and regular rhythm.   Pulmonary/Chest: Effort normal and breath sounds normal.  Abdominal: Soft. Bowel sounds are normal.  Musculoskeletal: Normal range of motion.  Neurological: He is alert and oriented to person, place, and time.  Skin: Skin is warm and dry.  Psychiatric:  Flight of ideas; tangential thinking.  Nursing note and vitals reviewed.    ED Treatments / Results  Labs (all labs ordered are listed, but only abnormal results are displayed) Labs Reviewed  COMPREHENSIVE METABOLIC PANEL  ETHANOL  SALICYLATE LEVEL  ACETAMINOPHEN LEVEL  CBC  RAPID URINE DRUG SCREEN, HOSP PERFORMED    EKG  EKG Interpretation None  Radiology No results found.  Procedures Procedures (including critical care time)  Medications Ordered in ED Medications - No data to display   Initial Impression / Assessment and Plan / ED Course  I have reviewed the triage vital signs and the nursing notes.  Pertinent labs & imaging results that were available during my care of the patient were reviewed by me and considered in my medical decision making (see chart for  details).     Patient has multiple psychiatric medical problems. He is not thinking clearly today. Will consult behavioral health.  Final Clinical Impressions(s) / ED Diagnoses   Final diagnoses:  None    New Prescriptions New Prescriptions   No medications on file     Donnetta Hutching, MD 05/06/17 [phone]

## 2017-05-06 NOTE — ED Notes (Signed)
Hourly rounding reveals patient in hall. No complaints, stable, in no acute distress. Q15 minute rounds and monitoring via Security Cameras to continue. 
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
## 2017-05-07 DIAGNOSIS — F17210 Nicotine dependence, cigarettes, uncomplicated: Secondary | ICD-10-CM

## 2017-05-07 DIAGNOSIS — F14180 Cocaine abuse with cocaine-induced anxiety disorder: Secondary | ICD-10-CM | POA: Diagnosis not present

## 2017-05-07 DIAGNOSIS — F1490 Cocaine use, unspecified, uncomplicated: Secondary | ICD-10-CM | POA: Diagnosis not present

## 2017-05-07 DIAGNOSIS — F1290 Cannabis use, unspecified, uncomplicated: Secondary | ICD-10-CM | POA: Diagnosis not present

## 2017-05-07 NOTE — BH Assessment (Signed)
Patient evaluated by Dr. Jannifer FranklinAkintayo and Nanine MeansJamison Lord, DNP on this day. The providers determined that patient is psychiatrically cleared and appropriate to discharge with outpatient referrals. Patient provided with discharge information to his [facility] provider, [facility]. Patient was agreeable to follow up. He also provided consent for this writer to contact his provider and make them aware of his discharge today.

## 2017-05-07 NOTE — ED Notes (Signed)
Hourly rounding reveals patient sleeping in room. No complaints, stable, in no acute distress. Q15 minute rounds and monitoring via Security Cameras to continue. 
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
## 2017-05-07 NOTE — ED Notes (Signed)
Pt discharged home. Discharged instructions read to pt who verbalized understanding. All belongings returned to pt who signed for same. Pt was cooperative at the time of discharge. He did not make any threats.

## 2017-05-07 NOTE — [facility] Suicide Risk Assessment (Signed)
Suicide Risk Assessment  Discharge Assessment   [facility]   Principal Problem: Cocaine abuse with cocaine-induced anxiety disorder [facility](HCC) Discharge Diagnoses:  Patient Active Problem List   Diagnosis Date Noted  . Cocaine abuse with cocaine-induced anxiety disorder [facility](HCC) [F14.180] 05/01/2017    Priority: High  . Polysubstance abuse [F19.10] 03/18/2017    Priority: High  . Intentional drug overdose (HCC) [T50.902A] 12/05/2016  . Acute encephalopathy [G93.40] 12/05/2016  . Sinus tachycardia [R00.0] 12/05/2016  . Hypotension [I95.9] 12/05/2016  . Overdose, intentional self-harm, initial encounter (HCC) [T50.902A] 11/20/2016  . Intentional SSRI (selective serotonin reuptake inhibitor) overdose (HCC) [T43.222A] 11/20/2016  . Homelessness [Z59.0] 09/02/2016  . Attention deficit hyperactivity disorder (ADHD) [F90.9] 07/03/2016  . 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 Tobacco use disorder [F17.200] 07/03/2016  . Unspecified depressive  disorder [F32.9] 07/03/2016  . 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 Dextromethorphan overdose [T48.3X1A] 06/03/2016  . Leukocytosis [D72.829]   . Cannabis use disorder, moderate, dependence (HCC) [F12.20] 12/04/2012    Total Time spent with patient: 45 minutes  Musculoskeletal: Strength & Muscle Tone: within normal limits Gait & Station: normal Patient leans: N/A  Psychiatric Specialty Exam: Physical Exam  Constitutional: He is oriented to person, place, and time. He appears well-developed and well-nourished.  HENT:  Head: Normocephalic.  Neck: Normal range of motion.  Respiratory: Effort normal.  Musculoskeletal: Normal range of motion.  Neurological: He is alert and oriented to person, place, and time.  Psychiatric: He has a normal mood and affect. His speech is normal and behavior is normal. Thought content normal. Cognition and memory are normal. He expresses impulsivity.    Review of Systems  Psychiatric/Behavioral: Positive for substance abuse.  All other systems reviewed and are negative.   Blood pressure 127/72, pulse 86, temperature 97.9 F (36.6 C), temperature source Oral, resp. rate 20, SpO2 100 %.There is no height or weight on file to calculate BMI.  General Appearance: Casual  Eye Contact:  Good  Speech:  Normal Rate  Volume:  Normal  Mood:  Euthymic  Affect:  Congruent  Thought Process:  Coherent and Descriptions of Associations: Intact  Orientation:  Full (Time, Place, and Person)  Thought Content:  WDL and Logical  Suicidal Thoughts:  No  Homicidal Thoughts:  No  Memory:  Immediate;   Good Recent;   Good Remote;   Good  Judgement:  Fair  Insight:  Fair  Psychomotor Activity:  Normal  Concentration:  Concentration: Good and Attention Span: Good  Recall:  Fis[employer]Fair  Fund of Knowledge:  Fair  Language:  Good  Akathisia:  No  Handed:  Right  AIMS (if indicated):     Assets:  Leisure Time Physical Health Resilience Social Support  ADL's:  Intact  Cognition:  WNL  Sleep:       Mental Status Per Nursing Assessment::   On Admission:   cocaine abuse with anxiety  Demographic Factors:  Male, Adolescent or young adult and Caucasian  Loss Factors: Legal issues  Historical Factors: Impulsivity  Risk Reduction Factors:   Sense of responsibility to family, Living with another person, especially a relative, Positive social support and Positive therapeutic relationship  Continued Clinical Symptoms:  None  Cognitive Features That Contribute To Risk:  None    Suicide Risk:  Minimal: No identifiable suicidal ideation.  Patients presenting with no risk factors but with morbid ruminations; may be classified as minimal risk based on the severity of the depressive symptoms  Plan Of Care/Follow-up recommendations:  Activity:  as tolerated Diet:  heart healhty diet  Messi Twedt, NP 05/07/2017, 10:39 AM

## 2017-05-07 NOTE — Consult Note (Signed)
[facility] Consult   Reason for Consult:  Cocaine abuse with anxiety Referring Physician:  EDP Patient Identification: Tymeer Vaquera MRN:  [id number] Principal Diagnosis: Cocaine abuse with cocaine-induced anxiety disorder [facility]) Diagnosis:   Patient Active Problem List   Diagnosis Date Noted  . Cocaine abuse with cocaine-induced anxiety disorder [facility]) [F14.180] 05/01/2017    Priority: High  . Polysubstance abuse [F19.10] 03/18/2017    Priority: High  . Intentional drug overdose ([place]) [T50.902A] 12/05/2016  . Acute encephalopathy [G93.40] 12/05/2016  . Sinus tachycardia [R00.0] 12/05/2016  . Hypotension [I95.9] 12/05/2016  . Overdose, intentional self-harm, initial encounter ([place]) [T50.902A] 11/20/2016  . Intentional SSRI (selective serotonin reuptake inhibitor) overdose ([place]) [T43.222A] 11/20/2016  . Homelessness [Z59.0] 09/02/2016  . Attention deficit hyperactivity disorder (ADHD) [F90.9] 07/03/2016  . 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 Tobacco use disorder [F17.200] 07/03/2016  . Unspecified depressive  disorder [F32.9] 07/03/2016  . 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 Dextromethorphan overdose [T48.3X1A] 06/03/2016  . Leukocytosis [D72.829]   . Cannabis use disorder, moderate, dependence ([place]) [F12.20] 12/04/2012    Total Time spent with patient: 45 minutes  Subjective:   Brylan Dec is a 21 y.o. male patient does not warrant admission.  HPI:  21 yo male who [place] the [place] and when the security officer talked to him, he told him about his mental health issues and he was suicidal to avoid getting in trouble.  On assessment, no suicidal/homicidal ideations, hallucinations, or withdrawal symptoms.  He is well known to this ED and providers with multiple admission but no vestment into his treatment.  Caveat:  Court dates this week on 7/26 for breaking and entering with simple assault,  already on probation.  Stable for discharge.  Past Psychiatric History: bipolar disorder, substance abuse  Risk to Self: None Risk to Others: Homicidal Ideation: No Thoughts of Harm to Others: No Current Homicidal Intent: No Current Homicidal Plan: No Access to Homicidal Means: No History of harm to others?: No Assessment of Violence: None Noted Does patient have access to weapons?: No Criminal Charges Pending?: No Does patient have a court date: No Prior Inpatient Therapy: Prior Inpatient Therapy: Yes Prior Therapy Dates: 06/2016, 07/2016, 08/2016, 10/2015, 11/2016, 01/2017 Prior Therapy Facilty/Provider(s): [facility], [facility], [facility], [facility]  Reason for Treatment: Depression, SI and substance abuse  Prior Outpatient Therapy: Prior Outpatient Therapy: Yes Prior Therapy Dates: ongoing  Prior Therapy Facilty/Provider(s): [facility]  Reason for Treatment: Medication management Does patient have an ACCT team?: Yes Does patient have Intensive In-House Services?  : No Does patient have [facility] services? : No Does patient have [facility] services?: No  Past Medical History:  Past Medical History:  Diagnosis Date  . ADHD (attention deficit hyperactivity disorder)   . Anxiety   . Bipolar 1 disorder ([place])   . Current smoker   . Depression   . Eating disorder   . Headache(784.0)   . History of ADHD 11/01/2015  . Medical history non-contributory   . Mental disorder   . Nicotine dependence 11/03/2015  . Psychoactive substance-induced mood disorder ([place]) 10/28/2015   No past surgical history on file. Family History: No family history on file. Family Psychiatric  History: unknown Social History:  History  Alcohol Use No     History  Drug Use  . Types: Marijuana, Cocaine, LSD, MDMA (Ecstacy)    Comment: Pt reports using Molly as well and reports using these drugs     Social  History   Social History  . Marital status: Single    Spouse name: N/A  . Number of children: N/A  . Years of education: N/A    Social History Main Topics  . Smoking status: Current Some Day Smoker    Packs/day: 0.00    Years: 4.00    Types: Cigarettes  . Smokeless tobacco: Never Used  . Alcohol use No  . Drug use: Yes    Types: Marijuana, Cocaine, LSD, MDMA (Ecstacy)     Comment: Pt reports using Molly as well and reports using these drugs   . Sexual activity: Yes    Birth control/ protection: None   Other Topics Concern  . Not on file   Social History Narrative  . No narrative on file   Additional Social History:    Allergies:  No Known Allergies  Labs:  Results for orders placed or performed during the hospital encounter of 05/06/17 (from the past 48 hour(s))  Comprehensive metabolic panel     Status: Abnormal   Collection Time: 05/06/17  5:51 PM  Result Value Ref Range   Sodium 140 135 - 145 mmol/L   Potassium 3.9 3.5 - 5.1 mmol/L   Chloride 104 101 - 111 mmol/L   CO2 27 22 - 32 mmol/L   Glucose, Bld 80 65 - 99 mg/dL   BUN 13 6 - 20 mg/dL   Creatinine, Ser 0.91 0.61 - 1.24 mg/dL   Calcium 9.5 8.9 - 10.3 mg/dL   Total Protein 7.6 6.5 - 8.1 g/dL   Albumin 4.5 3.5 - 5.0 g/dL   AST 118 (H) 15 - 41 U/L   ALT 91 (H) 17 - 63 U/L   Alkaline Phosphatase 57 38 - 126 U/L   Total Bilirubin 0.5 0.3 - 1.2 mg/dL   GFR calc non Af Amer >60 >60 mL/min   GFR calc Af Amer >60 >60 mL/min    Comment: (NOTE) The eGFR has been calculated using the CKD EPI equation. This calculation has not been validated in all clinical situations. eGFR's persistently <60 mL/min signify possible Chronic Kidney Disease.    Anion gap 9 5 - 15  Ethanol     Status: None   Collection Time: 05/06/17  5:51 PM  Result Value Ref Range   Alcohol, Ethyl (B) <5 <5 mg/dL    Comment:        LOWEST DETECTABLE LIMIT FOR SERUM ALCOHOL IS 5 mg/dL FOR MEDICAL PURPOSES ONLY   Salicylate level     Status: None   Collection Time: 05/06/17  5:51 PM  Result Value Ref Range   Salicylate Lvl <4.5 2.8 - 30.0 mg/dL  Acetaminophen level      Status: Abnormal   Collection Time: 05/06/17  5:51 PM  Result Value Ref Range   Acetaminophen (Tylenol), Serum <10 (L) 10 - 30 ug/mL    Comment:        THERAPEUTIC CONCENTRATIONS VARY SIGNIFICANTLY. A RANGE OF 10-30 ug/mL MAY BE AN EFFECTIVE CONCENTRATION FOR MANY PATIENTS. HOWEVER, SOME ARE BEST TREATED AT CONCENTRATIONS OUTSIDE THIS RANGE. ACETAMINOPHEN CONCENTRATIONS >150 ug/mL AT 4 HOURS AFTER INGESTION AND >50 ug/mL AT 12 HOURS AFTER INGESTION ARE OFTEN ASSOCIATED WITH TOXIC REACTIONS.   cbc     Status: Abnormal   Collection Time: 05/06/17  5:51 PM  Result Value Ref Range   WBC 12.2 (H) 4.0 - 10.5 K/uL   RBC 4.70 4.22 - 5.81 MIL/uL   Hemoglobin 15.5 13.0 - 17.0 g/dL   HCT  42.8 39.0 - 52.0 %   MCV 91.1 78.0 - 100.0 fL   MCH 33.0 26.0 - 34.0 pg   MCHC 36.2 (H) 30.0 - 36.0 g/dL   RDW 13.3 11.5 - 15.5 %   Platelets 222 150 - 400 K/uL  Rapid urine drug screen (hospital performed)     Status: Abnormal   Collection Time: 05/06/17  5:51 PM  Result Value Ref Range   Opiates NONE DETECTED NONE DETECTED   Cocaine POSITIVE (A) NONE DETECTED   Benzodiazepines NONE DETECTED NONE DETECTED   Amphetamines NONE DETECTED NONE DETECTED   Tetrahydrocannabinol POSITIVE (A) NONE DETECTED   Barbiturates NONE DETECTED NONE DETECTED    Comment:        DRUG SCREEN FOR MEDICAL PURPOSES ONLY.  IF CONFIRMATION IS NEEDED FOR ANY PURPOSE, NOTIFY LAB WITHIN 5 DAYS.        LOWEST DETECTABLE LIMITS FOR URINE DRUG SCREEN Drug Class       Cutoff (ng/mL) Amphetamine      1000 Barbiturate      200 Benzodiazepine   071 Tricyclics       219 Opiates          300 Cocaine          300 THC              50     Current Facility-Administered Medications  Medication Dose Route Frequency Provider Last Rate Last Dose  . alum & mag hydroxide-simeth (MAALOX/MYLANTA) 200-200-20 MG/5ML suspension 30 mL  30 mL Oral Q6H PRN Nat Christen, MD      . ibuprofen (ADVIL,MOTRIN) tablet 600 mg  600 mg Oral Q8H  PRN Nat Christen, MD      . nicotine (NICODERM CQ - dosed in mg/24 hours) patch 21 mg  21 mg Transdermal Daily Nat Christen, MD   Stopped at 05/06/17 2042  . ondansetron (ZOFRAN) tablet 4 mg  4 mg Oral Q8H PRN Nat Christen, MD       No current outpatient prescriptions on file.    Musculoskeletal: Strength & Muscle Tone: within normal limits Gait & Station: normal Patient leans: N/A  Psychiatric Specialty Exam: Physical Exam  Constitutional: He is oriented to person, place, and time. He appears well-developed and well-nourished.  HENT:  Head: Normocephalic.  Neck: Normal range of motion.  Respiratory: Effort normal.  Musculoskeletal: Normal range of motion.  Neurological: He is alert and oriented to person, place, and time.  Psychiatric: He has a normal mood and affect. His speech is normal and behavior is normal. Thought content normal. Cognition and memory are normal. He expresses impulsivity.    Review of Systems  Psychiatric/Behavioral: Positive for substance abuse.  All other systems reviewed and are negative.   Blood pressure 127/72, pulse 86, temperature 97.9 F (36.6 C), temperature source Oral, resp. rate 20, SpO2 100 %.There is no height or weight on file to calculate BMI.  General Appearance: Casual  Eye Contact:  Good  Speech:  Normal Rate  Volume:  Normal  Mood:  Euthymic  Affect:  Congruent  Thought Process:  Coherent and Descriptions of Associations: Intact  Orientation:  Full (Time, Place, and Person)  Thought Content:  WDL and Logical  Suicidal Thoughts:  No  Homicidal Thoughts:  No  Memory:  Immediate;   Good Recent;   Good Remote;   Good  Judgement:  Fair  Insight:  Fair  Psychomotor Activity:  Normal  Concentration:  Concentration: Good and Attention Span: Good  Recall:  [place] of Knowledge:  Fair  Language:  Good  Akathisia:  No  Handed:  Right  AIMS (if indicated):     Assets:  Leisure Time Physical Health Resilience Social Support   ADL's:  Intact  Cognition:  WNL  Sleep:        Treatment Plan Summary: Daily contact with patient to assess and evaluate symptoms and progress in treatment, Medication management and Plan cocaine abuse with cocaine induced anxiety:  -Crisis stabilization -Medication management:  None started as he needed to clear the cocaine -Individual and substance abuse counseling -Coordination of care with his ACT team, [facility]  Disposition: No evidence of imminent risk to self or others at present.    Waylan Boga, NP 05/07/2017 10:34 AM  Patient seen face-to-face for psychiatric evaluation, chart reviewed and case discussed with the physician extender and developed treatment plan. Reviewed the information documented and agree with the treatment plan. Corena Pilgrim, MD

## 2017-05-08 ENCOUNTER — Encounter (HOSPITAL_COMMUNITY): Payer: Self-pay

## 2017-05-08 ENCOUNTER — Emergency Department (HOSPITAL_COMMUNITY)
Admission: EM | Admit: 2017-05-08 | Discharge: 2017-05-09 | Disposition: A | Discharge status: Home / Self Care | Payer: Medicaid Other | Attending: Emergency Medicine | Admitting: Emergency Medicine

## 2017-05-08 DIAGNOSIS — F14150 Cocaine abuse with cocaine-induced psychotic disorder with delusions: Secondary | ICD-10-CM

## 2017-05-08 DIAGNOSIS — F14180 Cocaine abuse with cocaine-induced anxiety disorder: Secondary | ICD-10-CM | POA: Diagnosis not present

## 2017-05-08 DIAGNOSIS — F17210 Nicotine dependence, cigarettes, uncomplicated: Secondary | ICD-10-CM | POA: Insufficient documentation

## 2017-05-08 DIAGNOSIS — F919 Conduct disorder, unspecified: Secondary | ICD-10-CM | POA: Insufficient documentation

## 2017-05-08 DIAGNOSIS — R45851 Suicidal ideations: Secondary | ICD-10-CM | POA: Insufficient documentation

## 2017-05-08 DIAGNOSIS — F39 Unspecified mood [affective] disorder: Secondary | ICD-10-CM | POA: Diagnosis not present

## 2017-05-08 DIAGNOSIS — F1290 Cannabis use, unspecified, uncomplicated: Secondary | ICD-10-CM | POA: Diagnosis not present

## 2017-05-08 DIAGNOSIS — F1490 Cocaine use, unspecified, uncomplicated: Secondary | ICD-10-CM | POA: Diagnosis not present

## 2017-05-08 LAB — COMPREHENSIVE METABOLIC PANEL
ALK PHOS: 55 U/L (ref 38–126)
ALT: 68 U/L — AB (ref 17–63)
AST: 63 U/L — ABNORMAL HIGH (ref 15–41)
Albumin: 4 g/dL (ref 3.5–5.0)
Anion gap: 7 (ref 5–15)
BUN: 18 mg/dL (ref 6–20)
CALCIUM: 8.9 mg/dL (ref 8.9–10.3)
CO2: 31 mmol/L (ref 22–32)
CREATININE: 1.05 mg/dL (ref 0.61–1.24)
Chloride: 103 mmol/L (ref 101–111)
Glucose, Bld: 125 mg/dL — ABNORMAL HIGH (ref 65–99)
Potassium: 3.5 mmol/L (ref 3.5–5.1)
Sodium: 141 mmol/L (ref 135–145)
Total Bilirubin: 0.6 mg/dL (ref 0.3–1.2)
Total Protein: 6.7 g/dL (ref 6.5–8.1)

## 2017-05-08 LAB — CBC
HCT: 39.5 % (ref 39.0–52.0)
HEMOGLOBIN: 14 g/dL (ref 13.0–17.0)
MCH: 32.6 pg (ref 26.0–34.0)
MCHC: 35.4 g/dL (ref 30.0–36.0)
MCV: 91.9 fL (ref 78.0–100.0)
PLATELETS: 217 10*3/uL (ref 150–400)
RBC: 4.3 MIL/uL (ref 4.22–5.81)
RDW: 13.2 % (ref 11.5–15.5)
WBC: 10 10*3/uL (ref 4.0–10.5)

## 2017-05-08 LAB — ACETAMINOPHEN LEVEL: Acetaminophen (Tylenol), Serum: <10 ug/mL — ABNORMAL LOW (ref 10–30)

## 2017-05-08 LAB — RAPID URINE DRUG SCREEN, HOSP PERFORMED
AMPHETAMINES: NOT DETECTED
BARBITURATES: NOT DETECTED
Benzodiazepines: NOT DETECTED
Cocaine: POSITIVE — AB
OPIATES: NOT DETECTED
Tetrahydrocannabinol: NOT DETECTED

## 2017-05-08 LAB — SALICYLATE LEVEL

## 2017-05-08 LAB — ETHANOL

## 2017-05-08 NOTE — ED Notes (Signed)
Patient [facility] because "[facility]" and the staff "all looked like demons" and he states "I like it here and I don't want to leave".

## 2017-05-08 NOTE — ED Notes (Signed)
Bed: RESB Expected date:  Expected time:  Means of arrival:  Comments: [facility] from [facility] per [name]

## 2017-05-08 NOTE — ED Provider Notes (Signed)
[facility]-EMERGENCY DEPT Provider Note   CSN: [id number] Arrival date & time: 05/08/17  2122     History   Chief Complaint Chief Complaint  Patient presents with  . IVC  . Suicidal    HPI Thomas [name] is a 21 y.o. male.  HPI  21 y.o. male with a hx of Anxiety, Bipolar Disorder, presents to the Emergency Department today via [name] due to being non cooperative. Noted to be homicidal and suicidal. No plan verbalized. Pt under IVC by [facility]. Denies pain currently. No N/V. No CP/SOB/ABD pain. No meds PTA. No ETOH abuse. Pt sleeping comfortably. No other symptoms noted.   Past Medical History:  Diagnosis Date  . ADHD (attention deficit hyperactivity disorder)   . Anxiety   . Bipolar 1 disorder (HCC)   . Current smoker   . Depression   . Eating disorder   . Headache(784.0)   . History of ADHD 11/01/2015  . Medical history non-contributory   . Mental disorder   . Nicotine dependence 11/03/2015  . 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 Polysubstance abuse 03/18/2017  . Intentional drug overdose (HCC) 12/05/2016  . Acute encephalopathy 12/05/2016  . Sinus tachycardia 12/05/2016  . Hypotension 12/05/2016  . Overdose, intentional self-harm, initial encounter (HCC) 11/20/2016  . Intentional SSRI (selective serotonin reuptake inhibitor) overdose (HCC) 11/20/2016  . Homelessness 09/02/2016  . Attention deficit hyperactivity disorder (ADHD) 07/03/2016  . 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 Tobacco use disorder 07/03/2016  . Unspecified depressive  disorder 07/03/2016  . 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 Dextromethorphan overdose 06/03/2016  . Leukocytosis   . Cannabis use disorder, moderate, dependence (HCC) 12/04/2012    History reviewed. No pertinent surgical  history.     Home Medications    Prior to Admission medications   Not on File    Family History No family history on file.  Social History Social History  Substance Use Topics  . Smoking status: Current Some Day Smoker    Packs/day: 0.00    Years: 4.00    Types: Cigarettes  . Smokeless tobacco: Never Used  . Alcohol use No     Allergies   Patient has no known allergies.   Review of Systems Review of Systems ROS reviewed and all are negative for acute change except as noted in the HPI.  Physical Exam Updated Vital Signs BP 120/66 (BP Location: Right Arm)   Pulse 77   Temp 97.9 F (36.6 C) (Oral)   Resp 18   SpO2 100%   Physical Exam  Constitutional: He is oriented to person, place, and time. Vital signs are normal. He appears well-developed and well-nourished.  HENT:  Head: Normocephalic and atraumatic.  Right Ear: Hearing normal.  Left Ear: Hearing normal.  Eyes: Pupils are equal, round, and reactive to light. Conjunctivae and EOM are normal.  Neck: Normal range of motion. Neck supple.  Cardiovascular: Normal rate, regular rhythm, normal heart sounds and intact distal pulses.   Pulmonary/Chest: Effort normal and breath sounds normal.  Abdominal: Soft.  Musculoskeletal: Normal range of motion.  Neurological: He is alert and oriented to person, place, and time.  Skin: Skin is warm and dry.  Psychiatric: He has a normal mood and affect. His speech is normal and behavior is normal. Thought content normal.  Nursing note and vitals reviewed.  ED Treatments / Results  Labs (all labs ordered are listed, but only abnormal results are displayed) Labs Reviewed  COMPREHENSIVE METABOLIC PANEL - Abnormal; Notable for the following:       Result Value   Glucose, Bld 125 (*)    AST 63 (*)    ALT 68 (*)    All other components within normal limits  ACETAMINOPHEN LEVEL - Abnormal; Notable for the following:    Acetaminophen (Tylenol), Serum <10 (*)    All  other components within normal limits  RAPID URINE DRUG SCREEN, HOSP PERFORMED - Abnormal; Notable for the following:    Cocaine POSITIVE (*)    All other components within normal limits  ETHANOL  SALICYLATE LEVEL  CBC    EKG  EKG Interpretation None       Radiology No results found.  Procedures Procedures (including critical care time)  Medications Ordered in ED Medications - No data to display   Initial Impression / Assessment and Plan / ED Course  I have reviewed the triage vital signs and the nursing notes.  Pertinent labs & imaging results that were available during my care of the patient were reviewed by me and considered in my medical decision making (see chart for details).  Final Clinical Impressions(s) / ED Diagnoses  {I have reviewed and evaluated the relevant laboratory values.   {I have reviewed the relevant previous healthcare records.  {I obtained HPI from historian.   ED Course:  Assessment: Pt is a 21 y.o. male who presents with due to SI and HI from [place]. Under IVC. Transfer va [name]. On exam, pt in NAD. Nontoxic/nonseptic appearing. VSS. Afebrile. Lungs CTA. Heart RRR. Abdomen nontender soft. Psych labs drawn and unremarkable. Pt otherwise medically cleared for TTS Consult.   Disposition/Plan:  Pending TTS Consult Pt acknowledges and agrees with plan  Supervising Physician Palumbo, April, MD  Final diagnoses:  Suicidal ideation    New Prescriptions New Prescriptions   No medications on file     Thomas [name] 05/09/17 0011    Palumbo, April, MD 05/09/17 0121

## 2017-05-08 NOTE — ED Triage Notes (Signed)
[name] because he was not cooperative with them. Patient states he is Suicidal and homicidal and patient is under [facility] by [facility]

## 2017-05-09 DIAGNOSIS — F39 Unspecified mood [affective] disorder: Secondary | ICD-10-CM | POA: Diagnosis not present

## 2017-05-09 DIAGNOSIS — F17210 Nicotine dependence, cigarettes, uncomplicated: Secondary | ICD-10-CM | POA: Diagnosis not present

## 2017-05-09 DIAGNOSIS — F14150 Cocaine abuse with cocaine-induced psychotic disorder with delusions: Secondary | ICD-10-CM

## 2017-05-09 DIAGNOSIS — F14180 Cocaine abuse with cocaine-induced anxiety disorder: Secondary | ICD-10-CM | POA: Diagnosis not present

## 2017-05-09 DIAGNOSIS — F1290 Cannabis use, unspecified, uncomplicated: Secondary | ICD-10-CM | POA: Diagnosis not present

## 2017-05-09 MED ORDER — ACETAMINOPHEN 325 MG PO TABS: 650.0000 mg | ORAL_TABLET | ORAL | Status: DC | PRN

## 2017-05-09 NOTE — BH Assessment (Signed)
[facility] Assessment Progress Note  Per Thedore MinsMojeed Akintayo, MD, this pt does not require psychiatric hospitalization at this time.  Pt presents under [facility], which Dr Jannifer FranklinAkintayo has rescinded.  [facility] recommendation to continue treatment with the [facility].  This has been included in pt's discharge instructions.  Pt's nurse, Christen BameRonnie, has been notified.  Doylene Canninghomas Gabrian Hoque, MA Triage Specialist [phone]

## 2017-05-09 NOTE — ED Notes (Signed)
Pt came on unit ready to go to bed. Pt reports being tired and have not slept. Skin search done. Pt denies suicidal / homicidal ideation. Pt denies pain. Pt closed his eyes acting like he is sleeping and will not answer any of writer's questions.

## 2017-05-09 NOTE — ED Notes (Signed)
Patient discharged to home/selfcare.  Patient denies SI, HI and AVH.  Patient acknowledged understanding of all discharge instructions and receipt of all belongings.

## 2017-05-09 NOTE — Discharge Instructions (Signed)
For your behavioral health needs, you are advised to continue treatment with the [facility]: ° °     Psych[facility] Services ACT Team °     [facility], [address] °     [address] °     [place], [place]  [address] °     [phone] °     Crisis number: [phone] °

## 2017-05-09 NOTE — [facility] Suicide Risk Assessment (Signed)
Suicide Risk Assessment  Discharge Assessment   [facility]   Principal Problem: Cocaine abuse with cocaine-induced anxiety disorder [facility](HCC) Discharge Diagnoses:  Patient Active Problem List   Diagnosis Date Noted  . Cocaine abuse with cocaine-induced anxiety disorder [facility](HCC) [F14.180] 05/01/2017    Priority: High  . Polysubstance abuse [F19.10] 03/18/2017    Priority: High  . Intentional drug overdose (HCC) [T50.902A] 12/05/2016  . Acute encephalopathy [G93.40] 12/05/2016  . Sinus tachycardia [R00.0] 12/05/2016  . Hypotension [I95.9] 12/05/2016  . Overdose, intentional self-harm, initial encounter (HCC) [T50.902A] 11/20/2016  . Intentional SSRI (selective serotonin reuptake inhibitor) overdose (HCC) [T43.222A] 11/20/2016  . Homelessness [Z59.0] 09/02/2016  . Attention deficit hyperactivity disorder (ADHD) [F90.9] 07/03/2016  . 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 Tobacco use disorder [F17.200] 07/03/2016  . Unspecified depressive  disorder [F32.9] 07/03/2016  . 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 Dextromethorphan overdose [T48.3X1A] 06/03/2016  . Leukocytosis [D72.829]   . Cannabis use disorder, moderate, dependence (HCC) [F12.20] 12/04/2012    Total Time spent with patient: 45 minutes   MMusculoskeletal: Strength & Muscle Tone: within normal limits Gait & Station: normal Patient leans: N/A  Psychiatric Specialty Exam: Physical Exam  Constitutional: He is oriented to person, place, and time. He appears well-developed and well-nourished.  HENT:  Head: Normocephalic.  Neck: Normal range of motion.  Respiratory: Effort normal.  Musculoskeletal: Normal range of motion.  Neurological: He is alert and oriented to person, place, and time.  Psychiatric: He has a normal mood and affect. His speech is normal. Judgment and thought content normal. Cognition and memory are normal.    Review of Systems   Psychiatric/Behavioral: Positive for substance abuse.  All other systems reviewed and are negative.   Blood pressure 120/66, pulse 77, temperature 97.9 F (36.6 C), temperature source Oral, resp. rate 18, SpO2 100 %.There is no height or weight on file to calculate BMI.  General Appearance: Casual  Eye Contact:  Good  Speech:  Normal Rate  Volume:  Normal  Mood:  Euthymic  Affect:  Congruent  Thought Process:  Coherent and Descriptions of Associations: Intact  Orientation:  Full (Time, Place, and Person)  Thought Content:  WDL and Logical  Suicidal Thoughts:  No  Homicidal Thoughts:  No  Memory:  Immediate;   Good Recent;   Good Remote;   Good  Judgement:  Fair  Insight:  Fair  Psychomotor Activity:  Normal  Concentration:  Concentration: Good and Attention Span: Good  Recall:  [place]  Fund of Knowledge:  Fair  Language:  Fair  Akathisia:  No  Handed:  Right  AIMS (if indicated):     Assets:  Leisure Time Physical Health Resilience  ADL's:  Intact  Cognition:  WNL  Sleep:       Mental Status Per Nursing Assessment::   On Admission:   cocaine abuse with suicidal ideations  Demographic Factors:  Male, Adolescent or young adult and Caucasian  Loss Factors: Legal issues  Historical Factors: NA  Risk Reduction Factors:   Sense of responsibility to family, Positive social support and Positive therapeutic relationship  Continued Clinical Symptoms:  None  Cognitive Features That Contribute To Risk:  None    Suicide Risk:  Minimal: No identifiable suicidal ideation.  Patients presenting with no risk factors but with morbid ruminations; may be classified as minimal risk based on the severity of the depressive symptoms    Plan Of Care/Follow-up recommendations:  Activity:  as  tolerated Diet:  heart healthy diet  LORD, JAMISON, NP 05/09/2017, 9:05 AM

## 2017-05-09 NOTE — BHH Counselor (Addendum)
Clinician was unable to assess the pt after numerous times, to no avail. Clinician asked the pt's nurse to assist however the pt mumbled incoherently and continued sleeping. Clinician discussed the issue with Joselyn Glassmanyler, [place].   Disposition: Joselyn Glassmanyler, PA recommended AM Psychiatric Evaluation. Disposition discussed with Bonita QuinLinda, RN.   Redmond Pullingreylese D Damaree Sargent, MS, [facility], [facility] Triage Specialist [phone]

## 2017-05-09 NOTE — Consult Note (Signed)
[facility] Consult   Reason for Consult:  Cocaine abuse with suicidal/homicidal ideations Referring Physician:  EDP Patient Identification: Thomas [name] MRN:  [id number] Principal Diagnosis: Cocaine abuse with cocaine-induced anxiety disorder [facility]) Diagnosis:   Patient Active Problem List   Diagnosis Date Noted  . Cocaine abuse with cocaine-induced anxiety disorder [facility]) [F14.180] 05/01/2017    Priority: High  . Polysubstance abuse [F19.10] 03/18/2017    Priority: High  . Intentional drug overdose ([place]) [T50.902A] 12/05/2016  . Acute encephalopathy [G93.40] 12/05/2016  . Sinus tachycardia [R00.0] 12/05/2016  . Hypotension [I95.9] 12/05/2016  . Overdose, intentional self-harm, initial encounter ([place]) [T50.902A] 11/20/2016  . Intentional SSRI (selective serotonin reuptake inhibitor) overdose ([place]) [T43.222A] 11/20/2016  . Homelessness [Z59.0] 09/02/2016  . Attention deficit hyperactivity disorder (ADHD) [F90.9] 07/03/2016  . 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 Tobacco use disorder [F17.200] 07/03/2016  . Unspecified depressive  disorder [F32.9] 07/03/2016  . 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 Dextromethorphan overdose [T48.3X1A] 06/03/2016  . Leukocytosis [D72.829]   . Cannabis use disorder, moderate, dependence ([place]) [F12.20] 12/04/2012    Total Time spent with patient: 45 minutes  Subjective:   Thomas [name] is a 21 y.o. male patient does not warrant admission.  HPI:  21 yo male who was sent here from [facility] for not cooperating.  He was using "crank" and expressed suicidal/homicidal ideations.  Today, he is clear and coherent with no suicidal/homicidal ideations, hallucinations, or withdrawal symptoms.  He is well known to this ED and providers for multiple, similar presentations.  Thomas [name] has an ACT team, [facility], who will be notified.  Caveat:  Thomas [name]  Past Psychiatric History: bipolar  disorder, substance abuse  Risk to Self: None Risk to Others:  None Prior Inpatient Therapy:  Multiple times Prior Outpatient Therapy:  [facility]  Past Medical History:  Past Medical History:  Diagnosis Date  . ADHD (attention deficit hyperactivity disorder)   . Anxiety   . Bipolar 1 disorder ([place])   . Current smoker   . Depression   . Eating disorder   . Headache(784.0)   . History of ADHD 11/01/2015  . Medical history non-contributory   . Mental disorder   . Nicotine dependence 11/03/2015  . 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 No pertinent surgical history. Family History: No family history on file. Family Psychiatric  History: none Social History:  History  Alcohol Use No     History  Drug Use  . Types: Marijuana, Cocaine, LSD, MDMA (Ecstacy)    Comment: Pt reports using Molly as well and reports using these drugs     Social History   Social History  . Marital status: Single    Spouse name: N/A  . Number of children: N/A  . Years of education: N/A   Social History Main Topics  . Smoking status: Current Some Day Smoker    Packs/day: 0.00    Years: 4.00    Types: Cigarettes  . Smokeless tobacco: Never Used  . Alcohol use No  . Drug use: Yes    Types: Marijuana, Cocaine, LSD, MDMA (Ecstacy)     Comment: Pt reports using Molly as well and reports using these drugs   . Sexual activity: Yes    Birth control/ protection: None   Other Topics Concern  . None   Social History Narrative  . None   Additional Social History:    Allergies:  No Known Allergies  Labs:  Results for orders placed or performed during the hospital encounter of 05/08/17 (from the past 48 hour(s))  Comprehensive metabolic panel     Status: Abnormal   Collection Time: 05/08/17  9:46 PM  Result Value Ref Range   Sodium 141 135 - 145 mmol/L   Potassium 3.5 3.5 - 5.1 mmol/L   Chloride 103 101 - 111 mmol/L   CO2 31 22 - 32 mmol/L   Glucose, Bld  125 (H) 65 - 99 mg/dL   BUN 18 6 - 20 mg/dL   Creatinine, Ser 1.05 0.61 - 1.24 mg/dL   Calcium 8.9 8.9 - 10.3 mg/dL   Total Protein 6.7 6.5 - 8.1 g/dL   Albumin 4.0 3.5 - 5.0 g/dL   AST 63 (H) 15 - 41 U/L   ALT 68 (H) 17 - 63 U/L   Alkaline Phosphatase 55 38 - 126 U/L   Total Bilirubin 0.6 0.3 - 1.2 mg/dL   GFR calc non Af Amer >60 >60 mL/min   GFR calc Af Amer >60 >60 mL/min    Comment: (NOTE) The eGFR has been calculated using the CKD EPI equation. This calculation has not been validated in all clinical situations. eGFR's persistently <60 mL/min signify possible Chronic Kidney Disease.    Anion gap 7 5 - 15  cbc     Status: None   Collection Time: 05/08/17  9:46 PM  Result Value Ref Range   WBC 10.0 4.0 - 10.5 K/uL   RBC 4.30 4.22 - 5.81 MIL/uL   Hemoglobin 14.0 13.0 - 17.0 g/dL   HCT 39.5 39.0 - 52.0 %   MCV 91.9 78.0 - 100.0 fL   MCH 32.6 26.0 - 34.0 pg   MCHC 35.4 30.0 - 36.0 g/dL   RDW 13.2 11.5 - 15.5 %   Platelets 217 150 - 400 K/uL  Ethanol     Status: None   Collection Time: 05/08/17  9:47 PM  Result Value Ref Range   Alcohol, Ethyl (B) <5 <5 mg/dL    Comment:        LOWEST DETECTABLE LIMIT FOR SERUM ALCOHOL IS 5 mg/dL FOR MEDICAL PURPOSES ONLY   Salicylate level     Status: None   Collection Time: 05/08/17  9:47 PM  Result Value Ref Range   Salicylate Lvl <9.9 2.8 - 30.0 mg/dL  Acetaminophen level     Status: Abnormal   Collection Time: 05/08/17  9:47 PM  Result Value Ref Range   Acetaminophen (Tylenol), Serum <10 (L) 10 - 30 ug/mL    Comment:        THERAPEUTIC CONCENTRATIONS VARY SIGNIFICANTLY. A RANGE OF 10-30 ug/mL MAY BE AN EFFECTIVE CONCENTRATION FOR MANY PATIENTS. HOWEVER, SOME ARE BEST TREATED AT CONCENTRATIONS OUTSIDE THIS RANGE. ACETAMINOPHEN CONCENTRATIONS >150 ug/mL AT 4 HOURS AFTER INGESTION AND >50 ug/mL AT 12 HOURS AFTER INGESTION ARE OFTEN ASSOCIATED WITH TOXIC REACTIONS.   Rapid urine drug screen (hospital performed)      Status: Abnormal   Collection Time: 05/08/17 10:03 PM  Result Value Ref Range   Opiates NONE DETECTED NONE DETECTED   Cocaine POSITIVE (A) NONE DETECTED   Benzodiazepines NONE DETECTED NONE DETECTED   Amphetamines NONE DETECTED NONE DETECTED   Tetrahydrocannabinol NONE DETECTED NONE DETECTED   Barbiturates NONE DETECTED NONE DETECTED    Comment:        DRUG SCREEN FOR MEDICAL PURPOSES ONLY.  IF CONFIRMATION IS NEEDED FOR ANY PURPOSE, NOTIFY LAB WITHIN 5 DAYS.  LOWEST DETECTABLE LIMITS FOR URINE DRUG SCREEN Drug Class       Cutoff (ng/mL) Amphetamine      1000 Barbiturate      200 Benzodiazepine   932 Tricyclics       671 Opiates          300 Cocaine          300 THC              50     Current Facility-Administered Medications  Medication Dose Route Frequency Provider Last Rate Last Dose  . acetaminophen (TYLENOL) tablet 650 mg  650 mg Oral Q4H PRN Shary Decamp, PA-C       No current outpatient prescriptions on file.    Musculoskeletal: Strength & Muscle Tone: within normal limits Gait & Station: normal Patient leans: N/A  Psychiatric Specialty Exam: Physical Exam  Constitutional: He is oriented to person, place, and time. He appears well-developed and well-nourished.  HENT:  Head: Normocephalic.  Neck: Normal range of motion.  Respiratory: Effort normal.  Musculoskeletal: Normal range of motion.  Neurological: He is alert and oriented to person, place, and time.  Psychiatric: He has a normal mood and affect. His speech is normal. Judgment and thought content normal. Cognition and memory are normal.    Review of Systems  Psychiatric/Behavioral: Positive for substance abuse.  All other systems reviewed and are negative.   Blood pressure 120/66, pulse 77, temperature 97.9 F (36.6 C), temperature source Oral, resp. rate 18, SpO2 100 %.There is no height or weight on file to calculate BMI.  General Appearance: Casual  Eye Contact:  Good  Speech:  Normal  Rate  Volume:  Normal  Mood:  Euthymic  Affect:  Congruent  Thought Process:  Coherent and Descriptions of Associations: Intact  Orientation:  Full (Time, Place, and Person)  Thought Content:  WDL and Logical  Suicidal Thoughts:  No  Homicidal Thoughts:  No  Memory:  Immediate;   Good Recent;   Good Remote;   Good  Judgement:  Fair  Insight:  Fair  Psychomotor Activity:  Normal  Concentration:  Concentration: Good and Attention Span: Good  Recall:  [employer] of Knowledge:  Fair  Language:  Fair  Akathisia:  No  Handed:  Right  AIMS (if indicated):     Assets:  Leisure Time Physical Health Resilience  ADL's:  Intact  Cognition:  WNL  Sleep:        Treatment Plan Summary: Daily contact with patient to assess and evaluate symptoms and progress in treatment, Medication management and Plan cocaine abuse with cocaine induced mood disorder: -Crisis stabilization -Medication management:  None started as he needed to clear the cocaine -Individual and substance abuse counseling  Disposition: Discharge home  Waylan Boga, NP 05/09/2017 9:02 AM  Patient seen face-to-face for psychiatric evaluation, chart reviewed and case discussed with the physician extender and developed treatment plan. Reviewed the information documented and agree with the treatment plan. Corena Pilgrim, MD

## 2017-05-29 ENCOUNTER — Encounter (HOSPITAL_COMMUNITY): Payer: Self-pay | Admitting: Emergency Medicine

## 2017-05-29 ENCOUNTER — Emergency Department (HOSPITAL_COMMUNITY)
Admission: EM | Admit: 2017-05-29 | Discharge: 2017-05-30 | Disposition: A | Discharge status: Home / Self Care | Payer: Medicaid Other | Attending: Emergency Medicine | Admitting: Emergency Medicine

## 2017-05-29 DIAGNOSIS — T485X2A Poisoning by other anti-common-cold drugs, intentional self-harm, initial encounter: Secondary | ICD-10-CM | POA: Diagnosis not present

## 2017-05-29 DIAGNOSIS — R4182 Altered mental status, unspecified: Secondary | ICD-10-CM | POA: Diagnosis present

## 2017-05-29 DIAGNOSIS — R400 Somnolence: Secondary | ICD-10-CM | POA: Diagnosis not present

## 2017-05-29 DIAGNOSIS — F1410 Cocaine abuse, uncomplicated: Secondary | ICD-10-CM | POA: Diagnosis not present

## 2017-05-29 DIAGNOSIS — F332 Major depressive disorder, recurrent severe without psychotic features: Secondary | ICD-10-CM | POA: Diagnosis not present

## 2017-05-29 DIAGNOSIS — T50904A Poisoning by unspecified drugs, medicaments and biological substances, undetermined, initial encounter: Secondary | ICD-10-CM

## 2017-05-29 DIAGNOSIS — F14180 Cocaine abuse with cocaine-induced anxiety disorder: Secondary | ICD-10-CM | POA: Diagnosis present

## 2017-05-29 DIAGNOSIS — F909 Attention-deficit hyperactivity disorder, unspecified type: Secondary | ICD-10-CM | POA: Diagnosis not present

## 2017-05-29 DIAGNOSIS — F1920 Other psychoactive substance dependence, uncomplicated: Secondary | ICD-10-CM | POA: Diagnosis present

## 2017-05-29 DIAGNOSIS — F14150 Cocaine abuse with cocaine-induced psychotic disorder with delusions: Secondary | ICD-10-CM | POA: Diagnosis not present

## 2017-05-29 DIAGNOSIS — F17210 Nicotine dependence, cigarettes, uncomplicated: Secondary | ICD-10-CM | POA: Insufficient documentation

## 2017-05-29 DIAGNOSIS — Z590 Homelessness: Secondary | ICD-10-CM | POA: Diagnosis not present

## 2017-05-29 LAB — RAPID URINE DRUG SCREEN, HOSP PERFORMED
Amphetamines: NOT DETECTED
BARBITURATES: NOT DETECTED
BENZODIAZEPINES: NOT DETECTED
Cocaine: POSITIVE — AB
Opiates: NOT DETECTED
Tetrahydrocannabinol: NOT DETECTED

## 2017-05-29 LAB — DIFFERENTIAL
BASOS ABS: 0 10*3/uL (ref 0.0–0.1)
BASOS PCT: 0 %
Eosinophils Absolute: 0.2 10*3/uL (ref 0.0–0.7)
Eosinophils Relative: 1 %
Lymphocytes Relative: 9 %
Lymphs Abs: 1.5 10*3/uL (ref 0.7–4.0)
Monocytes Absolute: 1.2 10*3/uL — ABNORMAL HIGH (ref 0.1–1.0)
Monocytes Relative: 7 %
NEUTROS ABS: 13.2 10*3/uL — AB (ref 1.7–7.7)
NEUTROS PCT: 83 %

## 2017-05-29 LAB — COMPREHENSIVE METABOLIC PANEL
ALBUMIN: 4.5 g/dL (ref 3.5–5.0)
ALK PHOS: 72 U/L (ref 38–126)
ALT: 51 U/L (ref 17–63)
AST: 25 U/L (ref 15–41)
Anion gap: 10 (ref 5–15)
BILIRUBIN TOTAL: 0.5 mg/dL (ref 0.3–1.2)
BUN: 18 mg/dL (ref 6–20)
CALCIUM: 9.8 mg/dL (ref 8.9–10.3)
CO2: 26 mmol/L (ref 22–32)
CREATININE: 0.91 mg/dL (ref 0.61–1.24)
Chloride: 101 mmol/L (ref 101–111)
GFR calc Af Amer: >60 mL/min (ref >60)
GLUCOSE: 121 mg/dL — AB (ref 65–99)
Potassium: 4.1 mmol/L (ref 3.5–5.1)
Sodium: 137 mmol/L (ref 135–145)
Total Protein: 7.9 g/dL (ref 6.5–8.1)

## 2017-05-29 LAB — ACETAMINOPHEN LEVEL: Acetaminophen (Tylenol), Serum: <10 ug/mL — ABNORMAL LOW (ref 10–30)

## 2017-05-29 LAB — CBC
HEMATOCRIT: 43 % (ref 39.0–52.0)
Hemoglobin: 15.7 g/dL (ref 13.0–17.0)
MCH: 32 pg (ref 26.0–34.0)
MCHC: 36.5 g/dL — AB (ref 30.0–36.0)
MCV: 87.8 fL (ref 78.0–100.0)
Platelets: 195 10*3/uL (ref 150–400)
RBC: 4.9 MIL/uL (ref 4.22–5.81)
RDW: 12.6 % (ref 11.5–15.5)
WBC: 15 10*3/uL — ABNORMAL HIGH (ref 4.0–10.5)

## 2017-05-29 LAB — CBG MONITORING, ED: GLUCOSE-CAPILLARY: 115 mg/dL — AB (ref 65–99)

## 2017-05-29 LAB — ETHANOL

## 2017-05-29 LAB — SALICYLATE LEVEL: Salicylate Lvl: <7 mg/dL (ref 2.8–30.0)

## 2017-05-29 MED ORDER — ONDANSETRON 4 MG PO TBDP
4.0000 mg | ORAL_TABLET | Freq: Once | ORAL | Status: AC
  Administered 2017-05-29: 4 mg via ORAL
  Filled 2017-05-29: qty 1

## 2017-05-29 NOTE — ED Triage Notes (Signed)
Pt brought in by EMS for feeling suicidal  Pt told them that he drank a lot of cough syrup, kind unknown  VS stable

## 2017-05-29 NOTE — ED Notes (Signed)
Pt states he drank a whole bottle of delsym

## 2017-05-29 NOTE — ED Notes (Signed)
Introduced self to patient. Pt oriented to unit expectations.  Assessed pt for:  A) Anxiety &/or agitation: [facility] cooperative and sleepy. He went to bed.   S) Safety: Safety maintained with q-15-minute checks and hourly rounds by staff.  A) ADLs: Pt able to perform ADLs independently.  P) Pick-Up (room cleanliness): Pt's room clean and free of clutter.

## 2017-05-29 NOTE — ED Provider Notes (Signed)
[facility]-EMERGENCY DEPT Provider Note   CSN: [id number] Arrival date & time: 05/29/17  0507     History   Chief Complaint Chief Complaint  Patient presents with  . Suicidal    HPI Thomas [name] is a 21 y.o. male.  The history is provided by the patient and the EMS personnel. The history is limited by the condition of the patient (Altered mental status).  He was brought in by EMS because of suicidal thoughts. He apparently drank a bottle of cough medicine last night. He vomited before going to bed, and again on waking up. He is not able to give me any history, just states that he feels weird.  Past Medical History:  Diagnosis Date  . ADHD (attention deficit hyperactivity disorder)   . Anxiety   . Bipolar 1 disorder ([facility])   . Current smoker   . Depression   . Eating disorder   . Headache(784.0)   . History of ADHD 11/01/2015  . Medical history non-contributory   . Mental disorder   . Nicotine dependence 11/03/2015  . 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 Polysubstance abuse 03/18/2017  . Intentional drug overdose ([facility]) 12/05/2016  . Acute encephalopathy 12/05/2016  . Sinus tachycardia 12/05/2016  . Hypotension 12/05/2016  . Overdose, intentional self-harm, initial encounter ([facility]) 11/20/2016  . Intentional SSRI (selective serotonin reuptake inhibitor) overdose ([facility]) 11/20/2016  . Homelessness 09/02/2016  . Attention deficit hyperactivity disorder (ADHD) 07/03/2016  . 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 Tobacco use disorder 07/03/2016  . Unspecified depressive  disorder 07/03/2016  . 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 Dextromethorphan overdose 06/03/2016  . Leukocytosis   . Cannabis use  disorder, moderate, dependence ([facility]) 12/04/2012    History reviewed. No pertinent surgical history.     Home Medications    Prior to Admission medications   Not on File    Family History History reviewed. No pertinent family history.  Social History Social History  Substance Use Topics  . Smoking status: Current Some Day Smoker    Packs/day: 0.00    Years: 4.00    Types: Cigarettes  . Smokeless tobacco: Never Used  . Alcohol use No     Allergies   Patient has no known allergies.   Review of Systems Review of Systems  Unable to perform ROS: Mental status change     Physical Exam Updated Vital Signs BP 138/73 (BP Location: Left Arm)   Pulse 97   Temp (!) 97.3 F (36.3 C) (Oral)   Resp 12   Ht 5\' 6"  (1.676 m)   Wt 70.3 kg (155 lb)   SpO2 96%   BMI 25.02 kg/m   Physical Exam  Nursing note and vitals reviewed.  21 year old male, resting comfortably and in no acute distress. Vital signs are Normal. Oxygen saturation is 96%, which is normal. He is sleepy but arousable, but very limited speech when awakened. Head is normocephalic and atraumatic. PERRLA, EOMI. Oropharynx is clear. Neck is nontender and supple without adenopathy or JVD. Back is nontender and there is no CVA tenderness. Lungs are clear without rales, wheezes, or rhonchi. Chest is nontender. Heart has regular rate and rhythm without murmur. Abdomen is soft, flat, nontender without masses or hepatosplenomegaly and peristalsis is normoactive. Extremities  have no cyanosis or edema, full range of motion is present. Skin is warm and dry without rash. Neurologic: Mental status is as noted above, cranial nerves are intact, there are no gross motor or sensory deficits.  ED Treatments / Results  Labs (all labs ordered are listed, but only abnormal results are displayed) Labs Reviewed  COMPREHENSIVE METABOLIC PANEL - Abnormal; Notable for the following:       Result Value   Glucose, Bld 121 (*)     All other components within normal limits  ACETAMINOPHEN LEVEL - Abnormal; Notable for the following:    Acetaminophen (Tylenol), Serum <10 (*)    All other components within normal limits  CBC - Abnormal; Notable for the following:    WBC 15.0 (*)    MCHC 36.5 (*)    All other components within normal limits  RAPID URINE DRUG SCREEN, HOSP PERFORMED - Abnormal; Notable for the following:    Cocaine POSITIVE (*)    All other components within normal limits  DIFFERENTIAL - Abnormal; Notable for the following:    Neutro Abs 13.2 (*)    Monocytes Absolute 1.2 (*)    All other components within normal limits  CBG MONITORING, ED - Abnormal; Notable for the following:    Glucose-Capillary 115 (*)    All other components within normal limits  ETHANOL  SALICYLATE LEVEL    EKG  EKG Interpretation  Date/Time:  Tuesday May 29 2017 05:34:42 EDT Ventricular Rate:  82 PR Interval:    QRS Duration: 95 QT Interval:  366 QTC Calculation: 428 R Axis:   54 Text Interpretation:  Sinus rhythm Short PR interval Otherwise within normal limits When compared with ECG of 04/08/2017, No significant change was found Confirmed by Dione Booze ([id number]) on 05/29/2017 5:38:59 AM        Procedures Procedures (including critical care time)  Medications Ordered in ED Medications - No data to display   Initial Impression / Assessment and Plan / ED Course  I have reviewed the triage vital signs and the nursing notes.  Pertinent lab results that were available during my care of the patient were reviewed by me and considered in my medical decision making (see chart for details).  Overdose of cough medicine (Delsym). This is dextromethorphan. Poison control has been consulted and recommended acetaminophen and salicylate levels, medically cared if they are not elevated.Old records are reviewed, and he has multiple ED visits for substance abuse.  7:40 AM Drug screen is positive for cocaine. Alcohol level  is nondetectable. Acetaminophen and salicylate levels are nondetectable. However, he is still very somnolent. He will need to be observed until he wakes up. Case is signed out to Dr. Lynelle Doctor.  Final Clinical Impressions(s) / ED Diagnoses   Final diagnoses:  Ingestion of unknown drug, undetermined intent, initial encounter  Cocaine abuse  Somnolence    New Prescriptions New Prescriptions   No medications on file     Dione Booze, MD 05/29/17 [phone]

## 2017-05-29 NOTE — ED Notes (Signed)
Patient admits to SI and denies HI and AVH at this time. Plan of care discussed with patient. Patient voices no complaints or concerns at this time. Encouragement and support provided and safety maintain. Q 15 min safety checks remain in place and video monitoring.

## 2017-05-29 NOTE — ED Notes (Signed)
Pt given gatorade and apple sauce. He continues to feel stomach upset.

## 2017-05-29 NOTE — ED Notes (Signed)
Book bag and clothes and shoes placed in cabinet for Res B at nursing station

## 2017-05-29 NOTE — ED Notes (Signed)
Poison Control called to get update on labs and VS, and closed pt's case at their end.

## 2017-05-29 NOTE — ED Provider Notes (Signed)
Pt is awake now.  He states he is suicidal as noted in the EMS report.   Will consult tts.   Linwood DibblesKnapp, Brittanee Ghazarian, MD 05/29/17 [phone]

## 2017-05-29 NOTE — ED Notes (Signed)
ED [facility] Provider at bedside. 

## 2017-05-29 NOTE — BH Assessment (Signed)
Assessment Note  Thomas [name] is an 21 y.o. male with history of ADHD, Anxiety Disorder, Bipolar I Disorder, depression, eating disorder, Psychoactive Substance Mood Disorder, and Nicotine Dependence. He was brought in by EMS because of suicidal thoughts. He apparently drank a bottle of cough medicine last night.   He was brought in by EMS because of suicidal thoughts. He apparently drank a bottle of cough medicine last night. He vomited before going to bed, and again on waking up. He is not able to give me any history, just states that he feels weird. Patient admits that this was an intentionally attempt to end his life. Trigger for suicide attempt is due to homelessness. Patient stating [place] and needs a tent. Patient states that he has had several suicide attempts in the past that are "too many to count." Patient denies self injurious behaviors but states that he used "something" to draw a pentagram on his arm "because I thought it would look cool" but denies that he did this with the intent to harm himself. Patient denies HI. Patient denies history of aggression. Patient states that he has "mental health court" coming up as a part of his probation for breaking and entering. Patient denies auditory hallucinations. He has visual hallucinations of dogs running up to him.  Patient does not appear to be responding to internal stimuli at time of the assessment. Patient has a history of polysubstance abuse. UDS is positive for cocaine. BAL is negative. Patient hospitalized at several hospitals including [facility], [facility], etc.  He reports he isn't taking psychiatric meds currently. Per chart review, pt was inpatient at [facility] x 6 from 2014 to 2017 for bipolar d/o, psychosis and substance abuse. Pt was discharged from [facility] inpatient unit on 07/05/16. He said he can't afford presciptions give to him by [facility] at discharge. Patient has since not been compliant with his  medications.   Patient states that he has an ACT Team with [facility]. Patient states that he hasn't met with his ACT Team in some time but would like to meet with them about housing, medication, and getting a job. Patient indicates that he had an appointment with his ACTT provider today.  Diagnosis: Major Depressive Disorder, Recurrent, Severe, without psychotic features; Bipolar I Disorder; Stimulant Use Disorder Cannabis Disorder; Anxiolytics Use Disorder  Past Medical History:  Past Medical History:  Diagnosis Date  . ADHD (attention deficit hyperactivity disorder)   . Anxiety   . Bipolar 1 disorder ([place])   . Current smoker   . Depression   . Eating disorder   . Headache(784.0)   . History of ADHD 11/01/2015  . Medical history non-contributory   . Mental disorder   . Nicotine dependence 11/03/2015  . Psychoactive substance-induced mood disorder ([place]) 10/28/2015    History reviewed. No pertinent surgical history.  Family History: History reviewed. No pertinent family history.  Social History:  reports that he has been smoking Cigarettes.  He has been smoking about 0.00 packs per day for the past 4.00 years. He has never used smokeless tobacco. He reports that he uses drugs, including Marijuana, Cocaine, LSD, and MDMA (Ecstacy). He reports that he does not drink alcohol.  Additional Social History:  Alcohol / Drug Use Pain Medications: See MAR  Prescriptions: See MAR  Over the Counter: See MAR History of alcohol / drug use?: Yes Longest period of sobriety (when/how long): Unknown Negative Consequences of Use: Personal relationships, Legal Withdrawal Symptoms:  Agitation Substance #1 Name of Substance 1: Methylenedioxy-Methamphetamine Thomas [name]) 1 - Age of First Use: 17 1 - Amount (size/oz): Unknown 1 - Frequency: Unknown 1 - Duration: Ongoing 1 - Last Use / Amount: Unknown Substance #2 Name of Substance 2: Cocaine 2 - Age of First Use: 20 2 - Amount (size/oz): Unknown 2 -  Frequency: Unknown 2 - Duration: Ongoing 2 - Last Use / Amount: Unknown Substance #3 Name of Substance 3: Cannabis 3 - Age of First Use: 15 3 - Amount (size/oz): Unknown 3 - Frequency: Unknown 3 - Duration: Ongoing 3 - Last Use / Amount: Unknown Substance #4 Name of Substance 4: Xanax 4 - Age of First Use: Unknown 4 - Amount (size/oz): Unknown 4 - Frequency: Unknown 4 - Duration: Ongoing 4 - Last Use / Amount: Unknown Substance #5 Name of Substance 5: LSD 5 - Age of First Use: Unknown 5 - Amount (size/oz): Has only used "a few times" 5 - Frequency: unknown 5 - Duration: Denies this date 5 - Last Use / Amount: Unknown  CIWA: CIWA-Ar BP: 127/73 Pulse Rate: 89 COWS:    Allergies: No Known Allergies  Home Medications:  (Not in a hospital admission)  OB/GYN Status:  No LMP for male patient.  General Assessment Data [facility] of Assessment: [facility] TTS Assessment: In system Is this a Tele or Face-to-Face Assessment?: Face-to-Face Is this an Initial Assessment or a Re-assessment for this encounter?: Initial Assessment Marital status: Single Maiden name:  (n/a) Is patient pregnant?: No Pregnancy Status: No Living Arrangements: Other (Comment) Can pt return to current living arrangement?: Yes Admission Status: Voluntary Is patient capable of signing voluntary admission?: Yes Referral Source: Self/Family/Friend Insurance type:  (Medicaid )  Medical Screening Exam ([place]) Medical Exam completed: Yes  Crisis Care Plan Living Arrangements: Other (Comment) Legal Guardian: Other: Name of Psychiatrist: [facility] Name of Therapist: [facility]  Education Status Is patient currently in school?: No Current Grade:  (N/A) Highest grade of school patient has completed: GED Name of school: NA Contact person: NA  Risk to self with the past 6 months Suicidal Ideation: Yes-Currently Present Has patient been a risk to self within the past 6 months prior to admission? :  Yes Suicidal Intent: Yes-Currently Present Has patient had any suicidal intent within the past 6 months prior to admission? : Yes Is patient at risk for suicide?: Yes Suicidal Plan?: Yes-Currently Present Has patient had any suicidal plan within the past 6 months prior to admission? : Yes Specify Current Suicidal Plan:  (overdose ) Access to Means: Yes Specify Access to Suicidal Means:  (used today ) What has been your use of drugs/alcohol within the last 12 months?:  (polysubstance abuse ) Previous Attempts/Gestures: Yes How many times?:  (4 times per day ) Other Self Harm Risks:  (hx of cutting ) Triggers for Past Attempts: Unpredictable Intentional Self Injurious Behavior: Cutting Comment - Self Injurious Behavior:  (Last incident ) Family Suicide History: Yes Recent stressful life event(s): Other (Comment) (financial ) Persecutory voices/beliefs?: Yes Depression: Yes Depression Symptoms: Tearfulness Substance abuse history and/or treatment for substance abuse?: Yes Suicide prevention information given to non-admitted patients: Yes  Risk to Others within the past 6 months Homicidal Ideation: No Does patient have any lifetime risk of violence toward others beyond the six months prior to admission? : No Thoughts of Harm to Others: No Comment - Thoughts of Harm to Others:  (patient denies ) Current Homicidal Intent: No Current Homicidal Plan: No  Access to Homicidal Means: No Identified Victim:  (None reported) History of harm to others?: No Assessment of Violence: None Noted Violent Behavior Description:  (patient denies ) Does patient have access to weapons?: No Criminal Charges Pending?: No Describe Pending Criminal Charges:  (patient denies ) Does patient have a court date: No Court Date:  (None Reported) Is patient on probation?: No  Psychosis Hallucinations: Auditory Delusions: None noted  Mental Status Report Appearance/Hygiene: In hospital gown, Disheveled, Poor  hygiene Eye Contact: Fair Motor Activity: Freedom of movement Speech: Slurred Level of Consciousness: Quiet/awake Mood: Depressed Affect: Depressed Anxiety Level: Minimal Thought Processes: Relevant, Coherent Judgement: Impaired Orientation: Person, Place, Time, Situation Obsessive Compulsive Thoughts/Behaviors: None  Cognitive Functioning Concentration: Poor Memory: Recent Intact, Remote Intact IQ: Average Insight: Poor Impulse Control: Poor Appetite: Poor Weight Loss:  (0) Weight Gain:  (0) Sleep: Decreased Total Hours of Sleep:  (4 hrs of sleep ) Vegetative Symptoms: None  ADLScreening [facility] Assessment Services) Patient's cognitive ability adequate to safely complete daily activities?: Yes Patient able to express need for assistance with ADLs?: Yes Independently performs ADLs?: Yes (appropriate for developmental age)  Prior Inpatient Therapy Prior Inpatient Therapy: Yes Prior Therapy Dates: 06/2016, 07/2016, 08/2016, 10/2015, 11/2016, 01/2017 Prior Therapy Facilty/Provider(s): [facility], [facility], [place], [facility]  Reason for Treatment: Depression, SI and substance abuse   Prior Outpatient Therapy Prior Outpatient Therapy: Yes Prior Therapy Dates: ongoing  Prior Therapy Facilty/Provider(s): [facility]  Reason for Treatment: Medication management Does patient have an ACCT team?: Yes Does patient have Intensive In-House Services?  : No Does patient have [facility] services? : No Does patient have P4CC services?: No  ADL Screening (condition at time of admission) Patient's cognitive ability adequate to safely complete daily activities?: Yes Patient able to express need for assistance with ADLs?: Yes Independently performs ADLs?: Yes (appropriate for developmental age)             Regulatory affairs officer (For Healthcare) Does Patient Have a Medical Advance Directive?: No Would patient like information on creating a medical advance directive?: No - Patient declined Nutrition Screen- [facility]  Adult/[facility] Patient's home diet: Regular  Additional Information 1:1 In Past 12 Months?: Yes CIRT Risk: Yes Elopement Risk: Yes Does patient have medical clearance?: Yes     Disposition: Per Dr. Darleene Cleaver and Jinny Blossom, NP, patient to remain in the ED overnight; Pending am psych evaluation.  Disposition Initial Assessment Completed for this Encounter: Yes (Pending am psych evaluation ) Other disposition(s): Other (Comment) (Per Dr. Darleene Cleaver and Jinny Blossom, overnight OBS; am psych )  On Site Evaluation by:   Reviewed with Physician:    Waldon Merl 05/29/2017 12:02 PM

## 2017-05-29 NOTE — ED Notes (Signed)
Pt states he drank the bottle of cough syrup yesterday evening and had some vomiting last night before he went to bed and again when he woke up this morning

## 2017-05-30 ENCOUNTER — Encounter (HOSPITAL_COMMUNITY): Payer: Self-pay | Admitting: Emergency Medicine

## 2017-05-30 DIAGNOSIS — F1290 Cannabis use, unspecified, uncomplicated: Secondary | ICD-10-CM | POA: Diagnosis not present

## 2017-05-30 DIAGNOSIS — F1920 Other psychoactive substance dependence, uncomplicated: Secondary | ICD-10-CM

## 2017-05-30 DIAGNOSIS — F14150 Cocaine abuse with cocaine-induced psychotic disorder with delusions: Secondary | ICD-10-CM | POA: Diagnosis not present

## 2017-05-30 DIAGNOSIS — F17210 Nicotine dependence, cigarettes, uncomplicated: Secondary | ICD-10-CM | POA: Diagnosis not present

## 2017-05-30 DIAGNOSIS — Z590 Homelessness: Secondary | ICD-10-CM | POA: Diagnosis not present

## 2017-05-30 DIAGNOSIS — F1994 Other psychoactive substance use, unspecified with psychoactive substance-induced mood disorder: Secondary | ICD-10-CM | POA: Insufficient documentation

## 2017-05-30 MED ORDER — HYDROCORTISONE 1 % EX CREA
TOPICAL_CREAM | CUTANEOUS | Status: DC | PRN
  Administered 2017-05-30: 10:00:00 via TOPICAL
  Filled 2017-05-30: qty 28

## 2017-05-30 NOTE — BH Assessment (Signed)
[facility] Assessment Progress Note  Per Thedore MinsMojeed Akintayo, MD, this pt does not require psychiatric hospitalization at this time.  [facility] with recommendation to continue treatment with the [facility] ACT [facility].  This has been included in pt's discharge instructions.  Pt's nurse, Morrie Sheldonshley, has been notified.  Doylene Canninghomas Hope Brandenburger, MA Triage Specialist [phone]

## 2017-05-30 NOTE — ED Notes (Signed)
Pt d/c home per MD order. Discharge summary reviewed with pt. Pt verbalizes understanding. Pt denies SI/HI/AVH. Pt signed for personal property and property returned. Bus pass provided per pt request. Pt signed e-signature. Ambulatory off unit with [facility].  

## 2017-05-30 NOTE — Consult Note (Signed)
[facility] Consult   Reason for Consult:  Suicidal thoughts Referring Physician:  EDP Patient Identification: Thomas [name] MRN:  [id number] Principal Diagnosis: Dextromethorphan use disorder, severe, dependence ([place]) Diagnosis:   Patient Active Problem List   Diagnosis Date Noted  . Cocaine abuse [F14.10]   . 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 Cocaine abuse with cocaine-induced anxiety disorder ([place]) [F14.180] 05/01/2017  . Polysubstance abuse [F19.10] 03/18/2017  . Intentional drug overdose ([place]) [T50.902A] 12/05/2016  . Acute encephalopathy [G93.40] 12/05/2016  . Sinus tachycardia [R00.0] 12/05/2016  . Hypotension [I95.9] 12/05/2016  . Overdose, intentional self-harm, initial encounter ([place]) [T50.902A] 11/20/2016  . Intentional SSRI (selective serotonin reuptake inhibitor) overdose ([place]) [T43.222A] 11/20/2016  . Homelessness [Z59.0] 09/02/2016  . Attention deficit hyperactivity disorder (ADHD) [F90.9] 07/03/2016  . 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 Tobacco use disorder [F17.200] 07/03/2016  . Unspecified depressive  disorder [F32.9] 07/03/2016  . Dextromethorphan use disorder, severe, dependence ([place]) [F19.20] 07/03/2016  . Dextromethorphan overdose [T48.3X1A] 06/03/2016  . Leukocytosis [D72.829]   . Cannabis use disorder, moderate, dependence ([place]) [F12.20] 12/04/2012    Total Time spent with patient: 45 minutes  Subjective:   Thomas [name] is a 21 y.o. male patient admitted with suicidal thoughts.  HPI:  Pt was seen by Dr Darleene Cleaver and this clinician. Pt has a long history of drug abuse and dextromethorphan abuse. Pt is homeless and has had 19 emergency room visits and 2 hospital admissions in the past 6 months. Pt has an ACTT team with [facility] and does not follow up as he is supposed to.  Pt denies suicidal/homicidal ideation, denies auditory/visual hallucinations and does not  appear to be responding to internal stimuli. Pt was calm and cooperative, alert & oriented x 4, and appropriate for the situation. Pt appears to be at baseline and is psychiatrically cleared for discharge. Pt stated he will follow up with his ACTT team upon discharge.   Past Psychiatric History: Cocaine use disorder, ADHD, Dextromethorphan use disorder, Homelessness  Risk to Self: None Risk to Others: None Prior Inpatient Therapy: Prior Inpatient Therapy: Yes Prior Therapy Dates: 06/2016, 07/2016, 08/2016, 10/2015, 11/2016, 01/2017 Prior Therapy Facilty/Provider(s): [facility], [facility], [place], [facility]  Reason for Treatment: Depression, SI and substance abuse  Prior Outpatient Therapy: Prior Outpatient Therapy: Yes Prior Therapy Dates: ongoing  Prior Therapy Facilty/Provider(s): [facility]  Reason for Treatment: Medication management Does patient have an ACCT team?: Yes Does patient have Intensive In-House Services?  : No Does patient have [facility] services? : No Does patient have P4CC services?: No  Past Medical History:  Past Medical History:  Diagnosis Date  . ADHD (attention deficit hyperactivity disorder)   . Anxiety   . Bipolar 1 disorder ([place])   . Current smoker   . Depression   . Eating disorder   . Headache(784.0)   . History of ADHD 11/01/2015  . Medical history non-contributory   . Mental disorder   . Nicotine dependence 11/03/2015  . Psychoactive substance-induced mood disorder ([place]) 10/28/2015   History reviewed. No pertinent surgical history. Family History: History reviewed. No pertinent family history. Family Psychiatric  History: Unknown Social History:  History  Alcohol Use No     History  Drug Use  . Types: Marijuana, Cocaine, LSD, MDMA (Ecstacy)    Comment: Pt reports using Molly as well and reports using these drugs     Social History   Social History  . Marital status: Single  Spouse name: N/A  . Number of children: N/A  . Years of education: N/A   Social History  Main Topics  . Smoking status: Current Some Day Smoker    Packs/day: 0.00    Years: 4.00    Types: Cigarettes  . Smokeless tobacco: Never Used  . Alcohol use No  . Drug use: Yes    Types: Marijuana, Cocaine, LSD, MDMA (Ecstacy)     Comment: Pt reports using Molly as well and reports using these drugs   . Sexual activity: Yes    Birth control/ protection: None   Other Topics Concern  . None   Social History Narrative  . None   Additional Social History:    Allergies:  No Known Allergies  Labs:  Results for orders placed or performed during the hospital encounter of 05/29/17 (from the past 48 hour(s))  Comprehensive metabolic panel     Status: Abnormal   Collection Time: 05/29/17  5:21 AM  Result Value Ref Range   Sodium 137 135 - 145 mmol/L   Potassium 4.1 3.5 - 5.1 mmol/L   Chloride 101 101 - 111 mmol/L   CO2 26 22 - 32 mmol/L   Glucose, Bld 121 (H) 65 - 99 mg/dL   BUN 18 6 - 20 mg/dL   Creatinine, Ser 0.91 0.61 - 1.24 mg/dL   Calcium 9.8 8.9 - 10.3 mg/dL   Total Protein 7.9 6.5 - 8.1 g/dL   Albumin 4.5 3.5 - 5.0 g/dL   AST 25 15 - 41 U/L   ALT 51 17 - 63 U/L   Alkaline Phosphatase 72 38 - 126 U/L   Total Bilirubin 0.5 0.3 - 1.2 mg/dL   GFR calc non Af Amer >60 >60 mL/min   GFR calc Af Amer >60 >60 mL/min    Comment: (NOTE) The eGFR has been calculated using the CKD EPI equation. This calculation has not been validated in all clinical situations. eGFR's persistently <60 mL/min signify possible Chronic Kidney Disease.    Anion gap 10 5 - 15  Ethanol     Status: None   Collection Time: 05/29/17  5:21 AM  Result Value Ref Range   Alcohol, Ethyl (B) <5 <5 mg/dL    Comment:        LOWEST DETECTABLE LIMIT FOR SERUM ALCOHOL IS 5 mg/dL FOR MEDICAL PURPOSES ONLY   Salicylate level     Status: None   Collection Time: 05/29/17  5:21 AM  Result Value Ref Range   Salicylate Lvl <2.8 2.8 - 30.0 mg/dL  Acetaminophen level     Status: Abnormal   Collection Time:  05/29/17  5:21 AM  Result Value Ref Range   Acetaminophen (Tylenol), Serum <10 (L) 10 - 30 ug/mL    Comment:        THERAPEUTIC CONCENTRATIONS VARY SIGNIFICANTLY. A RANGE OF 10-30 ug/mL MAY BE AN EFFECTIVE CONCENTRATION FOR MANY PATIENTS. HOWEVER, SOME ARE BEST TREATED AT CONCENTRATIONS OUTSIDE THIS RANGE. ACETAMINOPHEN CONCENTRATIONS >150 ug/mL AT 4 HOURS AFTER INGESTION AND >50 ug/mL AT 12 HOURS AFTER INGESTION ARE OFTEN ASSOCIATED WITH TOXIC REACTIONS.   cbc     Status: Abnormal   Collection Time: 05/29/17  5:21 AM  Result Value Ref Range   WBC 15.0 (H) 4.0 - 10.5 K/uL   RBC 4.90 4.22 - 5.81 MIL/uL   Hemoglobin 15.7 13.0 - 17.0 g/dL   HCT 43.0 39.0 - 52.0 %   MCV 87.8 78.0 - 100.0 fL   MCH 32.0 26.0 -  34.0 pg   MCHC 36.5 (H) 30.0 - 36.0 g/dL   RDW 12.6 11.5 - 15.5 %   Platelets 195 150 - 400 K/uL  Differential     Status: Abnormal   Collection Time: 05/29/17  5:21 AM  Result Value Ref Range   Neutrophils Relative % 83 %   Neutro Abs 13.2 (H) 1.7 - 7.7 K/uL   Lymphocytes Relative 9 %   Lymphs Abs 1.5 0.7 - 4.0 K/uL   Monocytes Relative 7 %   Monocytes Absolute 1.2 (H) 0.1 - 1.0 K/uL   Eosinophils Relative 1 %   Eosinophils Absolute 0.2 0.0 - 0.7 K/uL   Basophils Relative 0 %   Basophils Absolute 0.0 0.0 - 0.1 K/uL  Rapid urine drug screen (hospital performed)     Status: Abnormal   Collection Time: 05/29/17  5:24 AM  Result Value Ref Range   Opiates NONE DETECTED NONE DETECTED   Cocaine POSITIVE (A) NONE DETECTED   Benzodiazepines NONE DETECTED NONE DETECTED   Amphetamines NONE DETECTED NONE DETECTED   Tetrahydrocannabinol NONE DETECTED NONE DETECTED   Barbiturates NONE DETECTED NONE DETECTED    Comment:        DRUG SCREEN FOR MEDICAL PURPOSES ONLY.  IF CONFIRMATION IS NEEDED FOR ANY PURPOSE, NOTIFY LAB WITHIN 5 DAYS.        LOWEST DETECTABLE LIMITS FOR URINE DRUG SCREEN Drug Class       Cutoff (ng/mL) Amphetamine      1000 Barbiturate       200 Benzodiazepine   235 Tricyclics       573 Opiates          300 Cocaine          300 THC              50   CBG monitoring, ED     Status: Abnormal   Collection Time: 05/29/17  6:15 AM  Result Value Ref Range   Glucose-Capillary 115 (H) 65 - 99 mg/dL    Current Facility-Administered Medications  Medication Dose Route Frequency Provider Last Rate Last Dose  . hydrocortisone cream 1 %   Topical PRN Ethelene Hal, NP       No current outpatient prescriptions on file.    Musculoskeletal: Strength & Muscle Tone: within normal limits Gait & Station: normal Patient leans: N/A  Psychiatric Specialty Exam: Physical Exam  ROS  Blood pressure 110/65, pulse 78, temperature 97.8 F (36.6 C), temperature source Oral, resp. rate 16, height 5' 6" (1.676 m), weight 70.3 kg (155 lb), SpO2 98 %.Body mass index is 25.02 kg/m.  General Appearance: Casual  Eye Contact:  Good  Speech:  Clear and Coherent and Normal Rate  Volume:  Normal  Mood:  Euthymic  Affect:  Congruent  Thought Process:  Coherent and Linear  Orientation:  Full (Time, Place, and Person)  Thought Content:  Logical  Suicidal Thoughts:  No  Homicidal Thoughts:  No  Memory:  Immediate;   Good Recent;   Good Remote;   Fair  Judgement:  Poor  Insight:  Lacking  Psychomotor Activity:  Normal  Concentration:  Concentration: Good and Attention Span: Good  Recall:  Good  Fund of Knowledge:  Good  Language:  Good  Akathisia:  No  Handed:  Right  AIMS (if indicated):     Assets:  Communication Skills Resilience Social Support  ADL's:  Intact  Cognition:  WNL  Sleep:        Treatment  Plan Summary: Plan Dextromethorphan use disorder  Follow up with [facility] team for medication management, therapy and social support. Avoid the use of illicit drugs and alcohol Take all medications as prescribed  Disposition: No evidence of imminent risk to self or others at present.   Patient does not meet criteria for  psychiatric inpatient admission. Discussed crisis plan, support from social network, calling 911, coming to the Emergency Department, and calling Suicide Hotline.  Ethelene Hal, NP 05/30/2017 10:55 AM  Patient seen face-to-face for psychiatric evaluation, chart reviewed and case discussed with the physician extender and developed treatment plan. Reviewed the information documented and agree with the treatment plan. Corena Pilgrim, MD

## 2017-05-30 NOTE — ED Notes (Signed)
Pt c/o itching at right ankle, small red rash noted, this nurse notified Jacki ConesLaurie, NP. Awaiting orders.

## 2017-05-30 NOTE — BHH Suicide Risk Assessment (Signed)
Suicide Risk Assessment  Discharge Assessment   [facility]   Principal Problem: Dextromethorphan use disorder, severe, dependence (HCC) Discharge Diagnoses:  Patient Active Problem List   Diagnosis Date Noted  . Cocaine abuse [F14.10]   . 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 Polysubstance abuse [F19.10] 03/18/2017  . Intentional drug overdose (HCC) [T50.902A] 12/05/2016  . Acute encephalopathy [G93.40] 12/05/2016  . Sinus tachycardia [R00.0] 12/05/2016  . Hypotension [I95.9] 12/05/2016  . Overdose, intentional self-harm, initial encounter (HCC) [T50.902A] 11/20/2016  . Intentional SSRI (selective serotonin reuptake inhibitor) overdose (HCC) [T43.222A] 11/20/2016  . Homelessness [Z59.0] 09/02/2016  . Attention deficit hyperactivity disorder (ADHD) [F90.9] 07/03/2016  . 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 Tobacco use disorder [F17.200] 07/03/2016  . Unspecified depressive  disorder [F32.9] 07/03/2016  . 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 Dextromethorphan overdose [T48.3X1A] 06/03/2016  . Leukocytosis [D72.829]   . Cannabis use disorder, moderate, dependence (HCC) [F12.20] 12/04/2012    Total Time spent with patient: 45 minutes  Musculoskeletal: Strength & Muscle Tone: within normal limits Gait & Station: normal Patient leans: N/A  Psychiatric Specialty Exam: Physical Exam ROS Blood pressure 110/65, pulse 78, temperature 97.8 F (36.6 C), temperature source Oral, resp. rate 16, height 5\' 6"  (1.676 m), weight 70.3 kg (155 lb), SpO2 98 %.Body mass index is 25.02 kg/m. General Appearance: Casual Eye Contact:  Good Speech:  Clear and Coherent and Normal Rate Volume:  Normal Mood:  Euthymic Affect:  Congruent Thought Process:  Coherent and  Linear Orientation:  Full (Time, Place, and Person) Thought Content:  Logical Suicidal Thoughts:  No Homicidal Thoughts:  No Memory:  Immediate;   Good Recent;   Good Remote;   Fair Judgement:  Poor Insight:  Lacking Psychomotor Activity:  Normal Concentration:  Concentration: Good and Attention Span: Good Recall:  Good Fund of Knowledge:  Good Language:  Good Akathisia:  No Handed:  Right AIMS (if indicated):    Assets:  Communication Skills Resilience Social Support ADL's:  Intact Cognition:  WNL  Mental Status Per Nursing Assessment::   On Admission:   suicidal thoughts as a result of drug use  Demographic Factors:  Male, Adolescent or young adult, Caucasian, Low socioeconomic status and Unemployed  Loss Factors: Financial problems/change in socioeconomic status  Historical Factors: Prior suicide attempts and Impulsivity  Risk Reduction Factors:   Sense of responsibility to family  Continued Clinical Symptoms:  Depression:   Impulsivity Alcohol/Substance Abuse/Dependencies More than one psychiatric diagnosis Previous Psychiatric Diagnoses and Treatments  Cognitive Features That Contribute To Risk:  Closed-mindedness    Suicide Risk:  Minimal: No identifiable suicidal ideation.  Patients presenting with no risk factors but with morbid ruminations; may be classified as minimal risk based on the severity of the depressive symptoms    Plan Of Care/Follow-up recommendations:  Activity:  as tolerated Diet:  Heart Healthy  Laveda AbbeLaurie Britton Autym Siess, NP 05/30/2017, 11:09 AM

## 2017-05-30 NOTE — Discharge Instructions (Signed)
For your ongoing behavioral health needs, you are advised to continue treatment with the [facility]:       [facility]      [facility] [address]      [address]      [place], [place]  [id number]      [phone]      Crisis number: [phone]

## 2017-05-31 ENCOUNTER — Encounter (HOSPITAL_COMMUNITY): Payer: Self-pay

## 2017-05-31 ENCOUNTER — Emergency Department (HOSPITAL_COMMUNITY)
Admission: EM | Admit: 2017-05-31 | Discharge: 2017-06-01 | Disposition: A | Discharge status: Home / Self Care | Payer: Medicaid Other | Attending: Emergency Medicine | Admitting: Emergency Medicine

## 2017-05-31 DIAGNOSIS — R443 Hallucinations, unspecified: Secondary | ICD-10-CM | POA: Diagnosis present

## 2017-05-31 DIAGNOSIS — F1910 Other psychoactive substance abuse, uncomplicated: Secondary | ICD-10-CM | POA: Diagnosis present

## 2017-05-31 DIAGNOSIS — F17210 Nicotine dependence, cigarettes, uncomplicated: Secondary | ICD-10-CM | POA: Insufficient documentation

## 2017-05-31 DIAGNOSIS — F14180 Cocaine abuse with cocaine-induced anxiety disorder: Secondary | ICD-10-CM | POA: Diagnosis present

## 2017-05-31 LAB — RAPID URINE DRUG SCREEN, HOSP PERFORMED
AMPHETAMINES: NOT DETECTED
Barbiturates: NOT DETECTED
Benzodiazepines: NOT DETECTED
Cocaine: POSITIVE — AB
OPIATES: NOT DETECTED
Tetrahydrocannabinol: POSITIVE — AB

## 2017-05-31 NOTE — ED Notes (Signed)
Bed: [facility] Expected date:  Expected time:  Means of arrival:  Comments: 

## 2017-05-31 NOTE — ED Triage Notes (Addendum)
BIB EMS w/ c/o visual hallucinations, pt states he has not been taking his Lithium x2 weeks. When asked by this RN if his has any thoughts of killing himself or anyone else pt replies "I just want it to end."

## 2017-06-01 LAB — COMPREHENSIVE METABOLIC PANEL
ALK PHOS: 62 U/L (ref 38–126)
ALT: 37 U/L (ref 17–63)
AST: 28 U/L (ref 15–41)
Albumin: 4.1 g/dL (ref 3.5–5.0)
Anion gap: 9 (ref 5–15)
BUN: 17 mg/dL (ref 6–20)
CALCIUM: 9.3 mg/dL (ref 8.9–10.3)
CO2: 27 mmol/L (ref 22–32)
CREATININE: 0.99 mg/dL (ref 0.61–1.24)
Chloride: 104 mmol/L (ref 101–111)
GFR calc non Af Amer: >60 mL/min (ref >60)
Glucose, Bld: 123 mg/dL — ABNORMAL HIGH (ref 65–99)
Potassium: 3.6 mmol/L (ref 3.5–5.1)
SODIUM: 140 mmol/L (ref 135–145)
Total Bilirubin: 0.6 mg/dL (ref 0.3–1.2)
Total Protein: 6.9 g/dL (ref 6.5–8.1)

## 2017-06-01 LAB — CBC
HEMATOCRIT: 37 % — AB (ref 39.0–52.0)
HEMOGLOBIN: 13.2 g/dL (ref 13.0–17.0)
MCH: 31.4 pg (ref 26.0–34.0)
MCHC: 35.7 g/dL (ref 30.0–36.0)
MCV: 87.9 fL (ref 78.0–100.0)
Platelets: 201 10*3/uL (ref 150–400)
RBC: 4.21 MIL/uL — ABNORMAL LOW (ref 4.22–5.81)
RDW: 12.6 % (ref 11.5–15.5)
WBC: 11.7 10*3/uL — ABNORMAL HIGH (ref 4.0–10.5)

## 2017-06-01 LAB — SALICYLATE LEVEL

## 2017-06-01 LAB — ETHANOL: Alcohol, Ethyl (B): <5 mg/dL (ref <5)

## 2017-06-01 LAB — ACETAMINOPHEN LEVEL: Acetaminophen (Tylenol), Serum: <10 ug/mL — ABNORMAL LOW (ref 10–30)

## 2017-06-01 NOTE — ED Notes (Signed)
Patient educated about search process and term "contraband " and routine search performed. No contraband found. 

## 2017-06-01 NOTE — ED Provider Notes (Signed)
[facility]-EMERGENCY DEPT Provider Note   CSN: [id number] Arrival date & time: 05/31/17  2311     History   Chief Complaint Chief Complaint  Patient presents with  . Hallucinations    HPI Thomas [name] is a 21 y.o. male.  21 yo M with a chief complaint of suicidal ideation. The patient stopped taking his medications about 2 weeks ago. It infiltrated were working for him. Now feels that he is tired of his hallucinations. Mentioned to the nurse that he wanted it all to end. Denies any new events in his life. Denies illegal drug use.   The history is provided by the patient.  Illness  This is a new problem. The current episode started 2 days ago. The problem occurs constantly. The problem has not changed since onset.Pertinent negatives include no chest pain, no abdominal pain, no headaches and no shortness of breath. Nothing aggravates the symptoms. Nothing relieves the symptoms. He has tried nothing for the symptoms. The treatment provided no relief.    Past Medical History:  Diagnosis Date  . ADHD (attention deficit hyperactivity disorder)   . Anxiety   . Bipolar 1 disorder (HCC)   . Current smoker   . Depression   . Eating disorder   . Headache(784.0)   . History of ADHD 11/01/2015  . Medical history non-contributory   . Mental disorder   . Nicotine dependence 11/03/2015  . 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 Cocaine abuse   . 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 Polysubstance abuse 03/18/2017  . Intentional drug overdose (HCC) 12/05/2016  . Acute encephalopathy 12/05/2016  . Sinus tachycardia 12/05/2016  . Hypotension 12/05/2016  . Overdose, intentional self-harm, initial encounter (HCC) 11/20/2016  . Intentional SSRI (selective serotonin reuptake  inhibitor) overdose (HCC) 11/20/2016  . Homelessness 09/02/2016  . Attention deficit hyperactivity disorder (ADHD) 07/03/2016  . 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 Tobacco use disorder 07/03/2016  . Unspecified depressive  disorder 07/03/2016  . 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 Dextromethorphan overdose 06/03/2016  . Leukocytosis   . Cannabis use disorder, moderate, dependence (HCC) 12/04/2012    History reviewed. No pertinent surgical history.     Home Medications    Prior to Admission medications   Medication Sig Start Date End Date Taking? Authorizing Provider  cetirizine (ZYRTEC) 10 MG tablet Take 10 mg by mouth daily.   Yes [provider]    Family History History reviewed. No pertinent family history.  Social History Social History  Substance Use Topics  . Smoking status: Current Some Day Smoker    Packs/day: 0.00    Years: 4.00    Types: Cigarettes  . Smokeless tobacco: Never Used  . Alcohol use No     Allergies   Patient has no known allergies.   Review of Systems Review of Systems  Constitutional: Negative for chills and fever.  HENT: Negative for congestion and facial swelling.   Eyes: Negative for discharge and visual disturbance.  Respiratory: Negative for shortness of breath.   Cardiovascular: Negative for chest pain and palpitations.  Gastrointestinal: Negative for abdominal pain, diarrhea and vomiting.  Musculoskeletal: Negative for arthralgias and myalgias.  Skin: Negative for color change and rash.  Neurological: Negative for tremors, syncope and headaches.  Psychiatric/Behavioral: Positive for hallucinations and suicidal ideas. Negative  for confusion and dysphoric mood. The patient is nervous/anxious.      Physical Exam Updated Vital Signs BP 118/74 (BP Location: Left Arm)   Pulse 83   Temp 98 F (36.7 C) (Oral)   Resp 16   SpO2 97%   Physical Exam  Constitutional: He is oriented to  person, place, and time. He appears well-developed and well-nourished.  HENT:  Head: Normocephalic and atraumatic.  Eyes: Pupils are equal, round, and reactive to light. EOM are normal.  Neck: Normal range of motion. Neck supple. No JVD present.  Cardiovascular: Normal rate and regular rhythm.  Exam reveals no gallop and no friction rub.   No murmur heard. Pulmonary/Chest: No respiratory distress. He has no wheezes.  Abdominal: He exhibits no distension and no mass. There is no tenderness. There is no rebound and no guarding.  Musculoskeletal: Normal range of motion.  Neurological: He is alert and oriented to person, place, and time.  Skin: No rash noted. No pallor.  Psychiatric: He has a normal mood and affect. His speech is delayed. He is slowed and withdrawn.  Nursing note and vitals reviewed.    ED Treatments / Results  Labs (all labs ordered are listed, but only abnormal results are displayed) Labs Reviewed  COMPREHENSIVE METABOLIC PANEL - Abnormal; Notable for the following:       Result Value   Glucose, Bld 123 (*)    All other components within normal limits  ACETAMINOPHEN LEVEL - Abnormal; Notable for the following:    Acetaminophen (Tylenol), Serum <10 (*)    All other components within normal limits  CBC - Abnormal; Notable for the following:    WBC 11.7 (*)    RBC 4.21 (*)    HCT 37.0 (*)    All other components within normal limits  RAPID URINE DRUG SCREEN, HOSP PERFORMED - Abnormal; Notable for the following:    Cocaine POSITIVE (*)    Tetrahydrocannabinol POSITIVE (*)    All other components within normal limits  ETHANOL  SALICYLATE LEVEL    EKG  EKG Interpretation None       Radiology No results found.  Procedures Procedures (including critical care time)  Medications Ordered in ED Medications - No data to display   Initial Impression / Assessment and Plan / ED Course  I have reviewed the triage vital signs and the nursing notes.  Pertinent  labs & imaging results that were available during my care of the patient were reviewed by me and considered in my medical decision making (see chart for details).     21 yo M With a chief complaints of suicidal ideation. I feel he is medically clear this time. TTS evaluation ordered.  The patient refused to talk to TTS and so they requested to be reconsulted in the morning.  The patients results and plan were reviewed and discussed.   Any x-rays performed were independently reviewed by myself.   Differential diagnosis were considered with the presenting HPI.  Medications - No data to display  Vitals:   05/31/17 2316 06/01/17 0045  BP: 125/72 118/74  Pulse: 92 83  Resp: 16 16  Temp: 98 F (36.7 C)   TempSrc: Oral   SpO2: 94% 97%    Final diagnoses:  Suicidal ideation    Admission/ observation were discussed with the admitting physician, patient and/or family and they are comfortable with the plan.    Final Clinical Impressions(s) / ED Diagnoses   Final diagnoses:  Suicidal ideation  New Prescriptions New Prescriptions   No medications on file     Melene Plan, DO 06/01/17 7062

## 2017-06-01 NOTE — BH Assessment (Signed)
BHH Assessment Progress Note  Per Thedore Mins, MD, this pt does not require psychiatric hospitalization at this time.  Pt is to be discharged from [facility] with recommendation to continue treatment with the [facility].  This has been included in pt's discharge instructions.  Pt's nurse, Jan, has been notified.  Doylene Canning, MA Triage Specialist [phone]

## 2017-06-01 NOTE — Discharge Instructions (Signed)
For your behavioral health needs, you are advised to follow up with the [facility]:       [facility]      [facility], [address]      [address]      [place], [place]  [address]      [phone]      Crisis number: [phone]

## 2017-06-01 NOTE — BH Assessment (Addendum)
Clinician discussed with Dr. Adela Lank and Leslie Andrea, RN, with the assistance of NT pt could not be roused. The pt only opened his eyes briefly before he continued sleeping. Clinician expressed to Dr. Adela Lank, a TTS a consult will be completed once the pt is roused.   Redmond Pulling, MS, [facility], [facility] Triage Specialist [phone]

## 2017-06-01 NOTE — ED Notes (Signed)
Pt is alert in his room with TV on. He reports that he was "out of it yesterday" and "seen too much things happening," "voices and stuff." He denies hallucinations this morning. Pt denies si and hi.

## 2017-06-01 NOTE — BHH Suicide Risk Assessment (Signed)
Suicide Risk Assessment  Discharge Assessment   [facility] Discharge Suicide Risk Assessment   Principal Problem: Cocaine abuse with cocaine-induced anxiety disorder [facility]) Discharge Diagnoses:  Patient Active Problem List   Diagnosis Date Noted  . Cocaine abuse with cocaine-induced anxiety disorder [facility]) [F14.180] 05/01/2017    Priority: High  . Polysubstance abuse [F19.10] 03/18/2017    Priority: High  . Cocaine abuse [F14.10]   . 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 Intentional drug overdose (HCC) [T50.902A] 12/05/2016  . Acute encephalopathy [G93.40] 12/05/2016  . Sinus tachycardia [R00.0] 12/05/2016  . Hypotension [I95.9] 12/05/2016  . Overdose, intentional self-harm, initial encounter (HCC) [T50.902A] 11/20/2016  . Intentional SSRI (selective serotonin reuptake inhibitor) overdose (HCC) [T43.222A] 11/20/2016  . Homelessness [Z59.0] 09/02/2016  . Attention deficit hyperactivity disorder (ADHD) [F90.9] 07/03/2016  . 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 Tobacco use disorder [F17.200] 07/03/2016  . Unspecified depressive  disorder [F32.9] 07/03/2016  . 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 Dextromethorphan overdose [T48.3X1A] 06/03/2016  . Leukocytosis [D72.829]   . Cannabis use disorder, moderate, dependence (HCC) [F12.20] 12/04/2012    Total Time spent with patient: 45 minutes  Musculoskeletal: Strength & Muscle Tone: within normal limits Gait & Station: normal Patient leans: N/A  Psychiatric Specialty Exam:   Blood pressure (!) 100/59, pulse 62, temperature 98 F (36.7 C), temperature source Oral, resp. rate 14, SpO2 98 %.There is no height or weight on file to calculate BMI.  General Appearance: Casual  Eye Contact::  Good  Speech:  Normal Rate409  Volume:  Normal  Mood:  Euthymic  Affect:  Congruent  Thought Process:  Coherent and Descriptions of Associations:  Intact  Orientation:  Full (Time, Place, and Person)  Thought Content:  WDL and Logical  Suicidal Thoughts:  No  Homicidal Thoughts:  No  Memory:  Immediate;   Good Recent;   Good Remote;   Good  Judgement:  Fair  Insight:  Fair  Psychomotor Activity:  Normal  Concentration:  Good  Recall:  Good  Fund of Knowledge:Fair  Language: Good  Akathisia:  No  Handed:  Right  AIMS (if indicated):     Assets:  Housing Leisure Time Physical Health Resilience Social Support  Sleep:     Cognition: WNL  ADL's:  Intact   Mental Status Per Nursing Assessment::   On Admission:   Cocaine abuse with auditory hallucinations upon arrival but none this morning.  No suicidal/homicidal ideations, hallucinations, or withdrawal symptoms.  Erjon is well known to this ED and providers for frequent similar presentations.  He often comes here to sleep off the drugs and then is at his baseline.  Stable for discharge.  Demographic Factors:  Male, Adolescent or young adult and Caucasian  Loss Factors: Legal issues  Historical Factors: NA  Risk Reduction Factors:   Sense of responsibility to family, Living with another person, especially a relative, Positive social support and Positive therapeutic relationship  Continued Clinical Symptoms:  None  Cognitive Features That Contribute To Risk:  None    Suicide Risk:  Minimal: No identifiable suicidal ideation.  Patients presenting with no risk factors but with morbid ruminations; may be classified as minimal risk based on the severity of the depressive symptoms    Plan Of Care/Follow-up recommendations:  Activity:  as tolerated Diet:  heart healthy diet  Jonta Gastineau, NP 06/01/2017, 11:29 AM
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
## 2017-07-16 ENCOUNTER — Encounter (HOSPITAL_COMMUNITY): Payer: Self-pay

## 2017-07-16 ENCOUNTER — Emergency Department (HOSPITAL_COMMUNITY)
Admission: EM | Admit: 2017-07-16 | Discharge: 2017-07-17 | Disposition: A | Discharge status: Home / Self Care | Payer: Medicaid Other | Attending: Emergency Medicine | Admitting: Emergency Medicine

## 2017-07-16 DIAGNOSIS — F1010 Alcohol abuse, uncomplicated: Secondary | ICD-10-CM | POA: Insufficient documentation

## 2017-07-16 DIAGNOSIS — F329 Major depressive disorder, single episode, unspecified: Secondary | ICD-10-CM | POA: Insufficient documentation

## 2017-07-16 DIAGNOSIS — R000 Tachycardia, unspecified: Secondary | ICD-10-CM | POA: Diagnosis not present

## 2017-07-16 DIAGNOSIS — F14150 Cocaine abuse with cocaine-induced psychotic disorder with delusions: Secondary | ICD-10-CM | POA: Insufficient documentation

## 2017-07-16 DIAGNOSIS — F17210 Nicotine dependence, cigarettes, uncomplicated: Secondary | ICD-10-CM | POA: Diagnosis not present

## 2017-07-16 DIAGNOSIS — F99 Mental disorder, not otherwise specified: Secondary | ICD-10-CM | POA: Insufficient documentation

## 2017-07-16 DIAGNOSIS — R45851 Suicidal ideations: Secondary | ICD-10-CM | POA: Diagnosis not present

## 2017-07-16 DIAGNOSIS — F902 Attention-deficit hyperactivity disorder, combined type: Secondary | ICD-10-CM | POA: Insufficient documentation

## 2017-07-16 DIAGNOSIS — F1920 Other psychoactive substance dependence, uncomplicated: Secondary | ICD-10-CM | POA: Insufficient documentation

## 2017-07-16 LAB — RAPID URINE DRUG SCREEN, HOSP PERFORMED
Amphetamines: POSITIVE — AB
BARBITURATES: NOT DETECTED
Benzodiazepines: NOT DETECTED
COCAINE: POSITIVE — AB
Opiates: NOT DETECTED
Tetrahydrocannabinol: NOT DETECTED

## 2017-07-16 LAB — CBC WITH DIFFERENTIAL/PLATELET
BASOS ABS: 0 10*3/uL (ref 0.0–0.1)
Basophils Relative: 0 %
Eosinophils Absolute: 0.1 10*3/uL (ref 0.0–0.7)
Eosinophils Relative: 1 %
HEMATOCRIT: 39.7 % (ref 39.0–52.0)
HEMOGLOBIN: 14.5 g/dL (ref 13.0–17.0)
LYMPHS PCT: 20 %
Lymphs Abs: 2.4 10*3/uL (ref 0.7–4.0)
MCH: 32.4 pg (ref 26.0–34.0)
MCHC: 36.5 g/dL — ABNORMAL HIGH (ref 30.0–36.0)
MCV: 88.6 fL (ref 78.0–100.0)
MONO ABS: 1.3 10*3/uL — AB (ref 0.1–1.0)
Monocytes Relative: 11 %
NEUTROS ABS: 8.4 10*3/uL — AB (ref 1.7–7.7)
NEUTROS PCT: 68 %
Platelets: 210 10*3/uL (ref 150–400)
RBC: 4.48 MIL/uL (ref 4.22–5.81)
RDW: 12.5 % (ref 11.5–15.5)
WBC: 12.3 10*3/uL — ABNORMAL HIGH (ref 4.0–10.5)

## 2017-07-16 LAB — COMPREHENSIVE METABOLIC PANEL
ALBUMIN: 4.9 g/dL (ref 3.5–5.0)
ALK PHOS: 79 U/L (ref 38–126)
ALT: 16 U/L — ABNORMAL LOW (ref 17–63)
ANION GAP: 10 (ref 5–15)
AST: 24 U/L (ref 15–41)
BILIRUBIN TOTAL: 0.5 mg/dL (ref 0.3–1.2)
BUN: 14 mg/dL (ref 6–20)
CALCIUM: 9.6 mg/dL (ref 8.9–10.3)
CO2: 25 mmol/L (ref 22–32)
CREATININE: 1.04 mg/dL (ref 0.61–1.24)
Chloride: 106 mmol/L (ref 101–111)
GFR calc non Af Amer: >60 mL/min (ref >60)
GLUCOSE: 92 mg/dL (ref 65–99)
Potassium: 3.3 mmol/L — ABNORMAL LOW (ref 3.5–5.1)
SODIUM: 141 mmol/L (ref 135–145)
TOTAL PROTEIN: 8.1 g/dL (ref 6.5–8.1)

## 2017-07-16 LAB — ETHANOL: Alcohol, Ethyl (B): 58 mg/dL — ABNORMAL HIGH (ref <10)

## 2017-07-16 LAB — SALICYLATE LEVEL: Salicylate Lvl: <7 mg/dL (ref 2.8–30.0)

## 2017-07-16 LAB — ACETAMINOPHEN LEVEL: Acetaminophen (Tylenol), Serum: <10 ug/mL — ABNORMAL LOW (ref 10–30)

## 2017-07-16 MED ORDER — SODIUM CHLORIDE 0.9 % IV BOLUS (SEPSIS)
1000.0000 mL | Freq: Once | INTRAVENOUS | Status: AC
  Administered 2017-07-16: 1000 mL via INTRAVENOUS

## 2017-07-16 MED ORDER — LORAZEPAM 2 MG/ML IJ SOLN: 0.0000 mg | Freq: Two times a day (BID) | INTRAMUSCULAR | Status: DC

## 2017-07-16 MED ORDER — LORAZEPAM 1 MG PO TABS: 0.0000 mg | ORAL_TABLET | Freq: Two times a day (BID) | ORAL | Status: DC

## 2017-07-16 MED ORDER — LORAZEPAM 2 MG/ML IJ SOLN: 0.0000 mg | Freq: Four times a day (QID) | INTRAMUSCULAR | Status: DC

## 2017-07-16 MED ORDER — LORAZEPAM 1 MG PO TABS: 0.0000 mg | ORAL_TABLET | Freq: Four times a day (QID) | ORAL | Status: DC

## 2017-07-16 MED ORDER — VITAMIN B-1 100 MG PO TABS
100.0000 mg | ORAL_TABLET | Freq: Every day | ORAL | Status: DC
  Administered 2017-07-16: 100 mg via ORAL
  Filled 2017-07-16: qty 1

## 2017-07-16 MED ORDER — LORAZEPAM 2 MG/ML IJ SOLN
1.0000 mg | Freq: Once | INTRAMUSCULAR | Status: AC
  Administered 2017-07-16: 1 mg via INTRAVENOUS
  Filled 2017-07-16: qty 1

## 2017-07-16 MED ORDER — THIAMINE HCL 100 MG/ML IJ SOLN: 100.0000 mg | Freq: Every day | INTRAMUSCULAR | Status: DC

## 2017-07-16 NOTE — BH Assessment (Signed)
BHH Assessment Progress Note  TTS went in to assess pt. Pt is currently sleeping and unable to be aroused after multiple attempts. Pt was given Ativan due to aggression in the ED. Pt will be assessed once he is alert and able to engage with Clinical research associate. Juliette Alcide, RN notified.   Princess Bruins, MSW, LCSW Therapeutic Triage Specialist  [phone]

## 2017-07-16 NOTE — ED Notes (Signed)
Pt anxious, ativan  given iv. Will assess effectiveness in 30 min. Thomas [name] P Holiday Mcmenamin

## 2017-07-16 NOTE — ED Triage Notes (Signed)
BIB EMS, pt responding to internal stimuli, pt stated to EMS he ingested cocaine, sudafed, and alcohol. Pt denies SI/HI. Pt behavior bizarre upon arrival to ED.   EMS Vitals  BP 102/56 P 117 RR 16 CBG 107

## 2017-07-16 NOTE — ED Provider Notes (Signed)
[facility]-EMERGENCY DEPT Provider Note   CSN: [id number] Arrival date & time: 07/16/17  1606     History   Chief Complaint Chief Complaint  Patient presents with  . Hallucinations  . Psychiatric Evaluation    HPI Thomas [name] is a 21 y.o. male with a history of bipolar 1, depression, psychoactive substance-induced mood disorder, polysubstance abuse who presents today for evaluation of suicidal ideation. He reports that within the past 24 hours he has taken approximately 10 Sudafed, "a lot" of cough syrup, and alcohol.  He says that he did all this trying to sell his soul to the devil, and figured if it didn't work that he would possibly kill him self.  He reports that he has been trying to kill himself on and off for most of of his life.  He denies any other substances. He states that he sees the devil in every day things. He gave the example of that he will hear 2 words out of a person's conversation and is able to relate that back to the devil.  He said that the devil would give him what he wants but then when he told someone about it it felt like all the energy went out of his head and his head felt deflated.   He reports that he recently was released from jail.    He denies any physical pains at this time. He does feel like his heart is beating fast.  HPI  Past Medical History:  Diagnosis Date  . ADHD (attention deficit hyperactivity disorder)   . Anxiety   . Bipolar 1 disorder (HCC)   . Current smoker   . Depression   . Eating disorder   . Headache(784.0)   . History of ADHD 11/01/2015  . Medical history non-contributory   . Mental disorder   . Nicotine dependence 11/03/2015  . 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 Cocaine abuse (HCC)   . 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 Polysubstance abuse (HCC) 03/18/2017  . Intentional drug overdose (HCC) 12/05/2016  . Acute encephalopathy 12/05/2016  . Sinus tachycardia 12/05/2016  . Hypotension 12/05/2016  . Overdose, intentional self-harm, initial encounter (HCC) 11/20/2016  . Intentional SSRI (selective serotonin reuptake inhibitor) overdose (HCC) 11/20/2016  . Homelessness 09/02/2016  . Attention deficit hyperactivity disorder (ADHD) 07/03/2016  . 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 Tobacco use disorder 07/03/2016  . Unspecified depressive  disorder 07/03/2016  . 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 Dextromethorphan overdose 06/03/2016  . Leukocytosis   . Cannabis use disorder, moderate, dependence (HCC) 12/04/2012    History reviewed. No pertinent surgical history.     Home Medications    Prior to Admission medications   Not on File    Family History History reviewed. No pertinent family history.  Social History Social History  Substance Use Topics  . Smoking status: Current Some Day Smoker    Packs/day: 0.00    Years: 4.00    Types: Cigarettes  . Smokeless tobacco: Never Used  . Alcohol use No     Allergies   Patient has no known allergies.   Review of Systems Review of Systems  Unable to perform ROS: Psychiatric disorder     Physical Exam Updated Vital Signs BP (!) 111/56 (BP Location: Left Arm)   Pulse Marland Kitchen)  104   Temp 99.1 F (37.3 C) (Oral)   Resp (!) 24   SpO2 97%   Physical Exam  Constitutional: He is oriented to person, place, and time. He appears well-developed and well-nourished. No distress.  HENT:  Head: Normocephalic and atraumatic.  Eyes: Conjunctivae are normal. Right eye exhibits no discharge. Left eye exhibits no discharge. No scleral icterus.  Neck: Normal range of motion.  Cardiovascular: Regular rhythm and intact distal pulses.  Tachycardia present.   Pulmonary/Chest: Effort normal. No stridor. No respiratory  distress.  Abdominal: He exhibits no distension.  Musculoskeletal: Normal range of motion. He exhibits no edema or deformity.  Neurological: He is alert and oriented to person, place, and time. He exhibits normal muscle tone.  Skin: Skin is warm and dry. He is not diaphoretic.  Psychiatric: His mood appears anxious. His affect is labile. His speech is rapid and/or pressured and tangential. He is agitated and hyperactive. He expresses suicidal ideation. He expresses no suicidal plans and no homicidal plans.  Nursing note and vitals reviewed.    ED Treatments / Results  Labs (all labs ordered are listed, but only abnormal results are displayed) Labs Reviewed  COMPREHENSIVE METABOLIC PANEL - Abnormal; Notable for the following:       Result Value   Potassium 3.3 (*)    ALT 16 (*)    All other components within normal limits  ETHANOL - Abnormal; Notable for the following:    Alcohol, Ethyl (B) 58 (*)    All other components within normal limits  RAPID URINE DRUG SCREEN, HOSP PERFORMED - Abnormal; Notable for the following:    Cocaine POSITIVE (*)    Amphetamines POSITIVE (*)    All other components within normal limits  CBC WITH DIFFERENTIAL/PLATELET - Abnormal; Notable for the following:    WBC 12.3 (*)    MCHC 36.5 (*)    Neutro Abs 8.4 (*)    Monocytes Absolute 1.3 (*)    All other components within normal limits  ACETAMINOPHEN LEVEL - Abnormal; Notable for the following:    Acetaminophen (Tylenol), Serum <10 (*)    All other components within normal limits  SALICYLATE LEVEL    EKG  EKG Interpretation None     EKG obtained.   Radiology No results found.  Procedures Procedures (including critical care time)  Medications Ordered in ED Medications  LORazepam (ATIVAN) injection 0-4 mg (not administered)    Or  LORazepam (ATIVAN) tablet 0-4 mg (not administered)  LORazepam (ATIVAN) injection 0-4 mg (not administered)    Or  LORazepam (ATIVAN) tablet 0-4 mg (not  administered)  thiamine (VITAMIN B-1) tablet 100 mg (not administered)    Or  thiamine (B-1) injection 100 mg (not administered)  sodium chloride 0.9 % bolus 1,000 mL (0 mLs Intravenous Stopped 07/16/17 1843)  LORazepam (ATIVAN) injection 1 mg (1 mg Intravenous Given 07/16/17 2017)     Initial Impression / Assessment and Plan / ED Course  I have reviewed the triage vital signs and the nursing notes.  Pertinent labs & imaging results that were available during my care of the patient were reviewed by me and considered in my medical decision making (see chart for details).  Clinical Course as of Jul 16 2145  Mon Jul 16, 2017  2025 Patient medically clear  [EH]    Clinical Course User Index [EH] Cristina Gong, PA-C    Patient presents to the emergency room for alcohol abuse, depression, psychosis, drug abuse, and suicidal ideation.  He is currently suicidal without a well-formed plan, however did questionably have an attempt yesterday to end his life.  His substances of abuse recently are alcohol, cough syrup, Sudafed, and cocaine. He does not have any acute physical complaints, is in no obvious distress. His demeanor is agitated, labile. He appears to be responding to internal stimuli. The patient was brought to the ED by EMS. He is here voluntarily.  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

## 2017-07-16 NOTE — ED Notes (Addendum)
Pt states he wants to be discharged at this time. Pt refuses to be reconnected to monitor.

## 2017-07-17 DIAGNOSIS — F1920 Other psychoactive substance dependence, uncomplicated: Secondary | ICD-10-CM | POA: Diagnosis present

## 2017-07-17 MED ORDER — GABAPENTIN 100 MG PO CAPS
200.0000 mg | ORAL_CAPSULE | Freq: Two times a day (BID) | ORAL | Status: DC
  Administered 2017-07-17: 200 mg via ORAL
  Filled 2017-07-17: qty 2

## 2017-07-17 NOTE — Discharge Instructions (Signed)
For your behavioral health needs, you are advised to continue treatment with the [facility]: ° °     [facility] ACT Team °     [facility], [address] °     [address] °     [place], [place]  [address] °     [phone] °     Crisis number: [phone] °

## 2017-07-17 NOTE — BHH Suicide Risk Assessment (Signed)
Suicide Risk Assessment  Discharge Assessment   [facility] Discharge Suicide Risk Assessment   Principal Problem: <principal problem not specified> Discharge Diagnoses:  Patient Active Problem List   Diagnosis Date Noted  . Cocaine abuse (HCC) [F14.10]   . 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 Polysubstance abuse (HCC) [F19.10] 03/18/2017  . Intentional drug overdose (HCC) [T50.902A] 12/05/2016  . Acute encephalopathy [G93.40] 12/05/2016  . Sinus tachycardia [R00.0] 12/05/2016  . Hypotension [I95.9] 12/05/2016  . Overdose, intentional self-harm, initial encounter (HCC) [T50.902A] 11/20/2016  . Intentional SSRI (selective serotonin reuptake inhibitor) overdose (HCC) [T43.222A] 11/20/2016  . Homelessness [Z59.0] 09/02/2016  . Attention deficit hyperactivity disorder (ADHD) [F90.9] 07/03/2016  . 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 Tobacco use disorder [F17.200] 07/03/2016  . Unspecified depressive  disorder [F32.9] 07/03/2016  . 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 Dextromethorphan overdose [T48.3X1A] 06/03/2016  . Leukocytosis [D72.829]   . Cannabis use disorder, moderate, dependence (HCC) [F12.20] 12/04/2012    Total Time spent with patient: 45 minutes  Musculoskeletal: Strength & Muscle Tone: within normal limits Gait & Station: normal Patient leans: N/A  Psychiatric Specialty Exam:   Blood pressure 120/70, pulse 74, temperature 97.6 F (36.4 C), temperature source Oral, resp. rate 16, SpO2 100 %.There is no height or weight on file to calculate BMI.  General Appearance: Casual  Eye Contact::  Good  Speech:  Clear and Coherent409  Volume:  Normal  Mood:  Euthymic  Affect:  Congruent  Thought Process:  Coherent, Goal Directed and Linear  Orientation:  Full (Time, Place, and Person)   Thought Content:  Logical  Suicidal Thoughts:  No  Homicidal Thoughts:  No  Memory:  Immediate;   Good Recent;   Good Remote;   Fair  Judgement:  Poor  Insight:  Lacking  Psychomotor Activity:  Normal  Concentration:  Good  Recall:  Good  Fund of Knowledge:Good  Language: Good  Akathisia:  No  Handed:  Right  AIMS (if indicated):     Assets:  Architect Housing Physical Health Resilience Social Support  Sleep:     Cognition: WNL  ADL's:  Intact   Mental Status Per Nursing Assessment::   On Admission:   bizarre behavior  Demographic Factors:  Male, Adolescent or young adult, Caucasian and Unemployed  Loss Factors: Legal issues and Financial problems/change in socioeconomic status  Historical Factors: Impulsivity  Risk Reduction Factors:   Sense of responsibility to family  Continued Clinical Symptoms:  Depression:   Impulsivity Alcohol/Substance Abuse/Dependencies  Cognitive Features That Contribute To Risk:  Closed-mindedness    Suicide Risk:  Minimal: No identifiable suicidal ideation.  Patients presenting with no risk factors but with morbid ruminations; may be classified as minimal risk based on the severity of the depressive symptoms    Plan Of Care/Follow-up recommendations:  Activity:  as tolerated Diet:  Heart Healthy  Thomas Abbe, NP 07/17/2017, 10:56 AM
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
## 2017-07-17 NOTE — BH Assessment (Signed)
BHH Assessment Progress Note  TTS attempted to assess pt. Pt is still drowsy and unable to be aroused. Juliette Alcide, RN states she attempted to wake the pt in order to complete the assessment but he is unable to stay alert. TTS will assess pt once he is alert and able to engage with assessor.   Princess Bruins, MSW, LCSW Therapeutic Triage Specialist  [phone]

## 2017-07-17 NOTE — BH Assessment (Signed)
BHH Assessment Progress Note  Per Thedore Mins, MD, this pt does not require psychiatric hospitalization at this time.  Pt is to be discharged from [facility]: [facility] with recommendation to continue treatment with the [facility].  This has been included in pt's discharge instructions.  Pt's nurse has been notified.  Doylene Canning, MA Triage Specialist [phone]

## 2017-08-02 ENCOUNTER — Encounter (HOSPITAL_COMMUNITY): Payer: Self-pay

## 2017-08-02 ENCOUNTER — Emergency Department (HOSPITAL_COMMUNITY)
Admission: EM | Admit: 2017-08-02 | Discharge: 2017-08-03 | Disposition: A | Discharge status: Home / Self Care | Payer: Medicaid Other | Attending: Emergency Medicine | Admitting: Emergency Medicine

## 2017-08-02 DIAGNOSIS — R440 Auditory hallucinations: Secondary | ICD-10-CM | POA: Diagnosis present

## 2017-08-02 DIAGNOSIS — F1910 Other psychoactive substance abuse, uncomplicated: Secondary | ICD-10-CM | POA: Insufficient documentation

## 2017-08-02 DIAGNOSIS — F14150 Cocaine abuse with cocaine-induced psychotic disorder with delusions: Secondary | ICD-10-CM | POA: Diagnosis present

## 2017-08-02 DIAGNOSIS — F14180 Cocaine abuse with cocaine-induced anxiety disorder: Secondary | ICD-10-CM | POA: Diagnosis present

## 2017-08-02 DIAGNOSIS — F17210 Nicotine dependence, cigarettes, uncomplicated: Secondary | ICD-10-CM | POA: Diagnosis not present

## 2017-08-02 DIAGNOSIS — R45851 Suicidal ideations: Secondary | ICD-10-CM | POA: Diagnosis not present

## 2017-08-02 LAB — SALICYLATE LEVEL

## 2017-08-02 LAB — CBC WITH DIFFERENTIAL/PLATELET
BASOS PCT: 0 %
Basophils Absolute: 0 10*3/uL (ref 0.0–0.1)
EOS ABS: 0.2 10*3/uL (ref 0.0–0.7)
Eosinophils Relative: 2 %
HCT: 41.3 % (ref 39.0–52.0)
HEMOGLOBIN: 14.5 g/dL (ref 13.0–17.0)
Lymphocytes Relative: 29 %
Lymphs Abs: 2.8 10*3/uL (ref 0.7–4.0)
MCH: 31.2 pg (ref 26.0–34.0)
MCHC: 35.1 g/dL (ref 30.0–36.0)
MCV: 88.8 fL (ref 78.0–100.0)
MONOS PCT: 11 %
Monocytes Absolute: 1 10*3/uL (ref 0.1–1.0)
NEUTROS PCT: 58 %
Neutro Abs: 5.7 10*3/uL (ref 1.7–7.7)
Platelets: 228 10*3/uL (ref 150–400)
RBC: 4.65 MIL/uL (ref 4.22–5.81)
RDW: 12.5 % (ref 11.5–15.5)
WBC: 9.7 10*3/uL (ref 4.0–10.5)

## 2017-08-02 LAB — ETHANOL: Alcohol, Ethyl (B): <10 mg/dL (ref <10)

## 2017-08-02 LAB — ACETAMINOPHEN LEVEL

## 2017-08-02 NOTE — ED Triage Notes (Signed)
[name] Patient called 911 stating he had taken Robitussin and was experiencing hallucinations. Patient stated he "hears the devil". Patient states he has not taken his medications since [facility]". [name] stated patient was originally oriented x4 but on arrival was disoriented and refused to answer questions. Patient stated to the charge nurse in passing that he "smoked crack". [name] states patient denies suicidal thoughts, he has violent thoughts but denies a plan to hurt any specific person.

## 2017-08-03 ENCOUNTER — Emergency Department (EMERGENCY_DEPARTMENT_HOSPITAL)
Admission: EM | Admit: 2017-08-03 | Discharge: 2017-08-04 | Disposition: A | Discharge status: Home / Self Care | Payer: Medicaid Other | Source: Home / Self Care | Attending: Emergency Medicine | Admitting: Emergency Medicine

## 2017-08-03 ENCOUNTER — Encounter (HOSPITAL_COMMUNITY): Payer: Self-pay

## 2017-08-03 DIAGNOSIS — F17210 Nicotine dependence, cigarettes, uncomplicated: Secondary | ICD-10-CM | POA: Insufficient documentation

## 2017-08-03 DIAGNOSIS — Z8659 Personal history of other mental and behavioral disorders: Secondary | ICD-10-CM | POA: Insufficient documentation

## 2017-08-03 DIAGNOSIS — R45851 Suicidal ideations: Secondary | ICD-10-CM | POA: Insufficient documentation

## 2017-08-03 DIAGNOSIS — F1994 Other psychoactive substance use, unspecified with psychoactive substance-induced mood disorder: Secondary | ICD-10-CM | POA: Insufficient documentation

## 2017-08-03 DIAGNOSIS — F319 Bipolar disorder, unspecified: Secondary | ICD-10-CM | POA: Insufficient documentation

## 2017-08-03 DIAGNOSIS — Z590 Homelessness: Secondary | ICD-10-CM | POA: Insufficient documentation

## 2017-08-03 DIAGNOSIS — R45850 Homicidal ideations: Secondary | ICD-10-CM | POA: Insufficient documentation

## 2017-08-03 DIAGNOSIS — F909 Attention-deficit hyperactivity disorder, unspecified type: Secondary | ICD-10-CM

## 2017-08-03 DIAGNOSIS — F14180 Cocaine abuse with cocaine-induced anxiety disorder: Secondary | ICD-10-CM | POA: Diagnosis present

## 2017-08-03 DIAGNOSIS — F1910 Other psychoactive substance abuse, uncomplicated: Secondary | ICD-10-CM | POA: Diagnosis present

## 2017-08-03 DIAGNOSIS — F419 Anxiety disorder, unspecified: Secondary | ICD-10-CM

## 2017-08-03 DIAGNOSIS — Z79899 Other long term (current) drug therapy: Secondary | ICD-10-CM

## 2017-08-03 LAB — CBC
HEMATOCRIT: 41.8 % (ref 39.0–52.0)
HEMOGLOBIN: 15 g/dL (ref 13.0–17.0)
MCH: 31.7 pg (ref 26.0–34.0)
MCHC: 35.9 g/dL (ref 30.0–36.0)
MCV: 88.4 fL (ref 78.0–100.0)
Platelets: 244 10*3/uL (ref 150–400)
RBC: 4.73 MIL/uL (ref 4.22–5.81)
RDW: 12.1 % (ref 11.5–15.5)
WBC: 10.1 10*3/uL (ref 4.0–10.5)

## 2017-08-03 LAB — COMPREHENSIVE METABOLIC PANEL
ALBUMIN: 4.7 g/dL (ref 3.5–5.0)
ALK PHOS: 75 U/L (ref 38–126)
ALT: 29 U/L (ref 17–63)
ALT: 31 U/L (ref 17–63)
ANION GAP: 8 (ref 5–15)
AST: 18 U/L (ref 15–41)
AST: 20 U/L (ref 15–41)
Albumin: 4.4 g/dL (ref 3.5–5.0)
Alkaline Phosphatase: 69 U/L (ref 38–126)
Anion gap: 11 (ref 5–15)
BILIRUBIN TOTAL: 0.5 mg/dL (ref 0.3–1.2)
BUN: 16 mg/dL (ref 6–20)
BUN: 18 mg/dL (ref 6–20)
CALCIUM: 9.8 mg/dL (ref 8.9–10.3)
CHLORIDE: 100 mmol/L — AB (ref 101–111)
CHLORIDE: 102 mmol/L (ref 101–111)
CO2: 27 mmol/L (ref 22–32)
CO2: 28 mmol/L (ref 22–32)
CREATININE: 0.83 mg/dL (ref 0.61–1.24)
Calcium: 9.4 mg/dL (ref 8.9–10.3)
Creatinine, Ser: 1 mg/dL (ref 0.61–1.24)
GFR calc non Af Amer: >60 mL/min (ref >60)
GLUCOSE: 93 mg/dL (ref 65–99)
Glucose, Bld: 101 mg/dL — ABNORMAL HIGH (ref 65–99)
POTASSIUM: 3.7 mmol/L (ref 3.5–5.1)
Potassium: 3.7 mmol/L (ref 3.5–5.1)
SODIUM: 139 mmol/L (ref 135–145)
Sodium: 137 mmol/L (ref 135–145)
TOTAL PROTEIN: 7.4 g/dL (ref 6.5–8.1)
Total Bilirubin: 0.5 mg/dL (ref 0.3–1.2)
Total Protein: 8 g/dL (ref 6.5–8.1)

## 2017-08-03 LAB — RAPID URINE DRUG SCREEN, HOSP PERFORMED
AMPHETAMINES: NOT DETECTED
BARBITURATES: NOT DETECTED
Benzodiazepines: NOT DETECTED
COCAINE: NOT DETECTED
OPIATES: NOT DETECTED
TETRAHYDROCANNABINOL: NOT DETECTED

## 2017-08-03 LAB — SALICYLATE LEVEL

## 2017-08-03 LAB — ETHANOL: Alcohol, Ethyl (B): <10 mg/dL (ref <10)

## 2017-08-03 LAB — ACETAMINOPHEN LEVEL: Acetaminophen (Tylenol), Serum: <10 ug/mL — ABNORMAL LOW (ref 10–30)

## 2017-08-03 NOTE — ED Notes (Signed)
Pt presents with hallucinations after ingesting Robitussin cough syrup,  [facility]  Reports hearing the Devil and HI towards no specific person.  Denies SI.  Monitoring for safety, Q 15 min checks in effect.  Safety check for contraband completed, no items found.

## 2017-08-03 NOTE — ED Notes (Signed)
PT went to restroom while this Clinical research associatewriter in another rm PT did not provide urine sample. At this time PT unable to give sample
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
## 2017-08-03 NOTE — BHH Counselor (Signed)
Clinician asked the RN to wake pt for TTS assessment. Clinician spoke to [place], [place] and noted the pt is unable to rouse. Clinician expressed she will check back and attempt to complete the assessment.    Redmond Pullingreylese D Giovan Pinsky, MS, [facility], [facility] Triage Specialist [phone]

## 2017-08-03 NOTE — Patient Outreach (Signed)
ED Peer Support Specialist Patient Intake (Complete at intake & 30-60 Day Follow-up)  Name: Niko Penson  MRN: [id number]  Age: 21 y.o.   Date of Admission: 08/03/2017  Intake: Initial Comments:      Primary Reason Admitted:Pt reported, hearing people saying bad things were going to happen to him. Pt reported, he hears that devil. Pt reported, "sometimes, I just want to die." Pt reported, feeling this way for the past few weeks, with no plan. Per chart, pt cut himself a month ago. Pt denies, HI, self-injurious behaviors and access to weapons.    Lab values: Alcohol/ETOH: Negative Positive UDS? No Amphetamines: Yes Barbiturates:   Benzodiazepines:   Cocaine: Yes Opiates:   Cannabinoids:    Demographic information: Gender: Male Ethnicity: White Marital Status: Single Insurance Status: Best boy (Work Neurosurgeon, Physicist, medical, etc.: Yes (section 8 ) Lives with: Alone Living situation: Homeless  Reported Patient History: Patient reported health conditions: Schizoaffective disorder Patient aware of HIV and hepatitis status: No  In past year, has patient visited ED for any reason? Yes  Number of ED visits: 3  Reason(s) for visit: Mental Health reasons  In past year, has patient been hospitalized for any reason?    Number of hospitalizations:    Reason(s) for hospitalization:    In past year, has patient been arrested? Yes  Number of arrests: 2  Reason(s) for arrest: stealing   In past year, has patient been incarcerated? Yes  Number of incarcerations: 1  Reason(s) for incarceration: stealing   In past year, has patient received medication-assisted treatment?    In past year, patient received the following treatments: Individual therapy  In past year, has patient received any harm reduction services? No  Did this include any of the following?    In past year, has patient received care from a mental health  provider for diagnosis other than SUD? No  In past year, is this first time patient has overdosed? Yes (pills)  Number of past overdoses: 2  In past year, is this first time patient has been hospitalized for an overdose? Yes (pills)  Number of hospitalizations for overdose(s):    Is patient currently receiving treatment for a mental health diagnosis? Yes ([facility] ACTT services. )  Patient reports experiencing difficulty participating in SUD treatment: No    Most important reason(s) for this difficulty?    Has patient received prior services for treatment? No  In past, patient has received services from following agencies: ACTT Theatre manager) ([facility])  Plan of Care:  Suggested follow up at these agencies/treatment centers: ACTT [facility] [employer]) (Continue to follow up with [facility] )  Other information: CPSS Aaron Edelman met with Pt and monitored services. CPSS asked Pt what are his plans after being discharged. CPSS was made aware that Pt has [facility] services, and that he will be visiting [facility] later today. CPSS encouraged Pt to stay involved with his ACTT team an not to be afraid to inform his team that he is not doing well. CPSS gave Pt a contact number to stay in touch with CPSS for follow up support.   Aaron Edelman Kyliana Standen, [place]  08/03/2017 11:48 AM

## 2017-08-03 NOTE — ED Notes (Signed)
Bed: WTR5 Expected date:  Expected time:  Means of arrival:  Comments: 

## 2017-08-03 NOTE — Discharge Instructions (Signed)
For your behavioral health needs, you are advised to continue treatment with the [facility] ACT Team: ° °     [facility] Team °     [facility], [address] °     [address] °     [place], [place]  [address] °     [phone] °     Crisis number: [phone] °

## 2017-08-03 NOTE — BHH Suicide Risk Assessment (Signed)
Suicide Risk Assessment  Discharge Assessment   [facility]   Principal Problem: Cocaine abuse with cocaine-induced anxiety disorder [facility](HCC) Discharge Diagnoses:  Patient Active Problem List   Diagnosis Date Noted  . Cocaine abuse with cocaine-induced anxiety disorder [facility](HCC) [F14.180] 05/01/2017    Priority: High  . Polysubstance abuse (HCC) [F19.10] 03/18/2017    Priority: High  . Polysubstance dependence (HCC) [F19.20] 07/17/2017  . Cocaine abuse with cocaine-induced psychotic disorder, with delusions (HCC) [F14.150]   . 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 Intentional drug overdose (HCC) [T50.902A] 12/05/2016  . Acute encephalopathy [G93.40] 12/05/2016  . Sinus tachycardia [R00.0] 12/05/2016  . Hypotension [I95.9] 12/05/2016  . Overdose, intentional self-harm, initial encounter (HCC) [T50.902A] 11/20/2016  . Intentional SSRI (selective serotonin reuptake inhibitor) overdose (HCC) [T43.222A] 11/20/2016  . Homelessness [Z59.0] 09/02/2016  . Attention deficit hyperactivity disorder (ADHD) [F90.9] 07/03/2016  . 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 Tobacco use disorder [F17.200] 07/03/2016  . Unspecified depressive  disorder [F32.9] 07/03/2016  . 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 Dextromethorphan overdose [T48.3X1A] 06/03/2016  . Leukocytosis [D72.829]   . Cannabis use disorder, moderate, dependence (HCC) [F12.20] 12/04/2012    Total Time spent with patient: 45 minutes  Musculoskeletal: Strength & Muscle Tone: within normal limits Gait & Station: normal Patient leans: N/A  Psychiatric Specialty Exam:   Blood pressure 119/66, pulse 72, temperature 98.3 F (36.8 C), resp. rate (!) 21, SpO2 100 %.There is no height or weight on file to calculate BMI.  General Appearance: Casual  Eye Contact::  Good  Speech:  Normal Rate409  Volume:  Normal   Mood:  Euthymic  Affect:  Congruent  Thought Process:  Coherent and Descriptions of Associations: Intact  Orientation:  Full (Time, Place, and Person)  Thought Content:  WDL and Logical  Suicidal Thoughts:  No  Homicidal Thoughts:  No  Memory:  Immediate;   Good Recent;   Good Remote;   Good  Judgement:  Fair  Insight:  Fair  Psychomotor Activity:  Normal  Concentration:  Good  Recall:  Fair  Fund of Knowledge:Fair  Language: Fair  Akathisia:  No  Handed:  Right  AIMS (if indicated):     Assets:  Leisure Time Physical Health Resilience Social Support  Sleep:     Cognition: WNL  ADL's:  Intact   Mental Status Per Nursing Assessment::   On Admission:   21 yo male who presented to the ED after abusing cough syrup and crack to get "high".  No suicidal/homicidal ideations, hallucinations, or withdrawal symptoms.  He is well known to this ED and providers with similar presentations.  [facility] is his ACT team and will be notified after Peer Support meets with him. Stable for discharge  Demographic Factors:  Male and Caucasian  Loss Factors: NA  Historical Factors: NA  Risk Reduction Factors:   Sense of responsibility to family, Positive social support and Positive therapeutic relationship  Continued Clinical Symptoms:  Substance abuse  Cognitive Features That Contribute To Risk:  None    Suicide Risk:  Minimal: No identifiable suicidal ideation.  Patients presenting with no risk factors but with morbid ruminations; may be classified as minimal risk based on the severity of the depressive symptoms    Plan Of Care/Follow-up recommendations:  Activity:  as tolerated Diet:  heart healthy diet  LORD, JAMISON, NP 08/03/2017, 12:31 PM

## 2017-08-03 NOTE — ED Triage Notes (Signed)
Pt states, "I don't know why I keep getting discharged. I am suicidal." He denies following up with resources given to him. Pt requesting a [place]aw[place] sandwich in triage. He is ambulatory, alert and oriented at time of triage.

## 2017-08-03 NOTE — ED Provider Notes (Signed)
[facility]   CSN: [id number] Arrival date & time: 08/02/17  2052     History   Chief Complaint Chief Complaint  Patient presents with  . Suicidal  . Medical Clearance    HPI Thomas [name] is a 21 y.o. male.  Patient returns to the emergency department with complaint of hallucinations, "hearing the devil talk to him". He reports to me he is suicidal and wants to end his life. No recent illness, fever, vomiting. [facility] was [facility] for similar symptoms and that he is not taking the medications prescribed to him.       Past Medical History:  Diagnosis Date  . ADHD (attention deficit hyperactivity disorder)   . Anxiety   . Bipolar 1 disorder (HCC)   . Current smoker   . Depression   . Eating disorder   . Headache(784.0)   . History of ADHD 11/01/2015  . Medical history non-contributory   . Mental disorder   . Nicotine dependence 11/03/2015  . 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 Polysubstance dependence (HCC) 07/17/2017  . Cocaine abuse with cocaine-induced psychotic disorder, with delusions (HCC)   . 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 Polysubstance abuse (HCC) 03/18/2017  . Intentional drug overdose (HCC) 12/05/2016  . Acute encephalopathy 12/05/2016  . Sinus tachycardia 12/05/2016  . Hypotension 12/05/2016  . Overdose, intentional self-harm, initial encounter (HCC) 11/20/2016  . Intentional SSRI (selective serotonin reuptake inhibitor) overdose (HCC) 11/20/2016  . Homelessness 09/02/2016  . Attention deficit hyperactivity disorder (ADHD) 07/03/2016  . 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 Tobacco use disorder 07/03/2016  . Unspecified depressive  disorder 07/03/2016  .  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 Dextromethorphan overdose 06/03/2016  . Leukocytosis   . Cannabis use disorder, moderate, dependence (HCC) 12/04/2012    History reviewed. No pertinent surgical history.     Home Medications    Prior to Admission medications   Not on File    Family History History reviewed. No pertinent family history.  Social History Social History  Substance Use Topics  . Smoking status: Current Some Day Smoker    Packs/day: 0.00    Years: 4.00    Types: Cigarettes  . Smokeless tobacco: Never Used  . Alcohol use No     Allergies   Patient has no known allergies.   Review of Systems Review of Systems  Constitutional: Negative for chills and fever.  HENT: Negative.   Respiratory: Negative.   Cardiovascular: Negative.   Gastrointestinal: Negative.   Musculoskeletal: Negative.   Skin: Negative.   Neurological: Negative.   Psychiatric/Behavioral: Positive for hallucinations and suicidal ideas.     Physical Exam Updated Vital Signs BP 106/81 (BP Location: Right Arm)   Pulse 78   Temp 98.1 F (36.7 C) (Oral)   Resp 20   SpO2 99%   Physical Exam  Constitutional: He appears well-developed and well-nourished.  Patient is sleepy but arousable.   HENT:  Head: Normocephalic.  Neck: Normal range of motion. Neck supple.  Cardiovascular: Normal rate and regular rhythm.   Pulmonary/Chest: Effort normal and breath sounds normal.  Abdominal: Soft. Bowel sounds are normal. There is no tenderness. There is no rebound and no guarding.  Musculoskeletal: Normal range of motion.  Neurological: He exhibits  normal muscle tone. Coordination normal.  Skin: Skin is warm and dry. No rash noted.  Psychiatric: His affect is blunt. He is actively hallucinating. He expresses suicidal ideation. He is noncommunicative.     ED Treatments / Results  Labs (all labs ordered are listed, but only abnormal results are  displayed) Labs Reviewed  COMPREHENSIVE METABOLIC PANEL - Abnormal; Notable for the following:       Result Value   Glucose, Bld 101 (*)    All other components within normal limits  ACETAMINOPHEN LEVEL - Abnormal; Notable for the following:    Acetaminophen (Tylenol), Serum <10 (*)    All other components within normal limits  ETHANOL  CBC WITH DIFFERENTIAL/PLATELET  SALICYLATE LEVEL  RAPID URINE DRUG SCREEN, HOSP PERFORMED  CBG MONITORING, ED    EKG  EKG Interpretation None       Radiology No results found.  Procedures Procedures (including critical care time)  Medications Ordered in ED Medications - No data to display   Initial Impression / Assessment and Plan / ED Course  I have reviewed the triage vital signs and the nursing notes.  Pertinent labs & imaging results that were available during my care of the patient were reviewed by me and considered in my medical decision making (see chart for details).     Patient well known to the emergency department with history of substance abuse, depression and homelessness presents with active hallucinations and SI.  The patient is difficult to get a complete history on secondary to somnolence. He acknowledges SI and auditory hallucinations. He denies pain or other complaint.   TTS consultation requested to determine disposition.  Final Clinical Impressions(s) / ED Diagnoses   Final diagnoses:  None   1. Auditory hallucinations 2. SI 3. Polysubstance abuse   New Prescriptions New Prescriptions   No medications on file     Elpidio Anis, Cordelia Poche 08/03/17 0533    Molpus, Jonny Ruiz, MD 08/03/17 [phone]

## 2017-08-03 NOTE — BHH Counselor (Signed)
Clinician discussed pt's pending TTS assessment with Melvenia BeamShari, PA due to the is unable to rouse. Clinician expressed she will check back and attempt to complete the assessment.  Redmond Pullingreylese D [name], MS, [facility], [facility] Triage Specialist [phone]

## 2017-08-03 NOTE — ED Notes (Signed)
Pt discharged home. Discharged instructions read to pt who verbalized understanding. All belongings returned to pt who signed for same. Denies SI/HI, is not delusional and not responding to internal stimuli. Escorted pt to the ED exit. At discharge pt said that he is going to see his [facility] today.

## 2017-08-03 NOTE — ED Provider Notes (Signed)
[facility]-EMERGENCY DEPT Provider Note   CSN: [id number] Arrival date & time: 08/03/17  1949     History   Chief Complaint Chief Complaint  Patient presents with  . Suicidal    HPI Thomas [name] is a 21 y.o. male.  The history is provided by the patient and medical records. No language interpreter was used.  Mental Health Problem  Presenting symptoms: depression, hallucinations, homicidal ideas, suicidal thoughts and suicidal threats   Presenting symptoms: no agitation and no suicide attempt   Degree of incapacity (severity):  Severe Onset quality:  Gradual Timing:  Constant Progression:  Worsening Chronicity:  Recurrent Treatment compliance:  Untreated Worsened by:  Nothing Ineffective treatments:  None tried Associated symptoms: no abdominal pain, no chest pain, no fatigue and no headaches   Risk factors: hx of mental illness and hx of suicide attempts     Past Medical History:  Diagnosis Date  . ADHD (attention deficit hyperactivity disorder)   . Anxiety   . Bipolar 1 disorder (HCC)   . Current smoker   . Depression   . Eating disorder   . Headache(784.0)   . History of ADHD 11/01/2015  . Medical history non-contributory   . Mental disorder   . Nicotine dependence 11/03/2015  . 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 Polysubstance dependence (HCC) 07/17/2017  . Cocaine abuse with cocaine-induced psychotic disorder, with delusions (HCC)   . 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 Polysubstance abuse (HCC) 03/18/2017  . Intentional drug overdose (HCC) 12/05/2016  . Acute encephalopathy 12/05/2016  . Sinus tachycardia 12/05/2016  . Hypotension 12/05/2016  . Overdose, intentional self-harm, initial encounter (HCC) 11/20/2016  .  Intentional SSRI (selective serotonin reuptake inhibitor) overdose (HCC) 11/20/2016  . Homelessness 09/02/2016  . Attention deficit hyperactivity disorder (ADHD) 07/03/2016  . 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 Tobacco use disorder 07/03/2016  . Unspecified depressive  disorder 07/03/2016  . 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 Dextromethorphan overdose 06/03/2016  . Leukocytosis   . Cannabis use disorder, moderate, dependence (HCC) 12/04/2012    History reviewed. No pertinent surgical history.     Home Medications    Prior to Admission medications   Medication Sig Start Date End Date Taking? Authorizing Provider  methylphenidate (RITALIN) 10 MG tablet Take 10 mg by mouth 2 (two) times daily. 08/01/17 08/31/17  [provider]  paliperidone (INVEGA SUSTENNA) 234 MG/1.5ML SUSP injection Inject 234 mg into the muscle every 30 (thirty) days.    [provider]    Family History History reviewed. No pertinent family history.  Social History Social History  Substance Use Topics  . Smoking status: Current Some Day Smoker    Packs/day: 0.00    Years: 4.00    Types: Cigarettes  . Smokeless tobacco: Never Used  . Alcohol use No     Allergies   Patient has no known allergies.   Review of Systems Review of Systems  Constitutional: Negative for chills, fatigue and fever.  HENT: Negative for congestion.   Eyes: Negative for visual disturbance.  Respiratory: Negative for chest tightness, shortness of breath, wheezing and stridor.   Cardiovascular: Negative for chest pain.  Gastrointestinal: Negative for abdominal pain.  Genitourinary: Negative for dysuria.  Musculoskeletal: Negative for back pain.  Neurological: Negative for light-headedness, numbness  and headaches.  Psychiatric/Behavioral: Positive for hallucinations, homicidal ideas and suicidal ideas. Negative for agitation.  All other systems reviewed and are  negative.    Physical Exam Updated Vital Signs BP 129/77 (BP Location: Right Arm)   Pulse 74   Temp 97.7 F (36.5 C) (Oral)   Resp 18   SpO2 99%   Physical Exam  Constitutional: He is oriented to person, place, and time. He appears well-developed and well-nourished. No distress.  HENT:  Head: Normocephalic and atraumatic.  Mouth/Throat: Oropharynx is clear and moist.  Eyes: Pupils are equal, round, and reactive to light. Conjunctivae and EOM are normal.  Neck: Neck supple.  Cardiovascular: Normal rate and regular rhythm.   No murmur heard. Pulmonary/Chest: Effort normal and breath sounds normal. No respiratory distress. He has no wheezes. He exhibits no tenderness.  Abdominal: Soft. There is no tenderness.  Musculoskeletal: He exhibits no edema or tenderness.  Neurological: He is alert and oriented to person, place, and time. No sensory deficit. He exhibits normal muscle tone.  Skin: Skin is warm and dry. Capillary refill takes less than 2 seconds. He is not diaphoretic. No erythema.  Psychiatric: He is actively hallucinating. He exhibits a depressed mood. He expresses homicidal and suicidal ideation. He expresses suicidal plans. He expresses no homicidal plans.  Nursing note and vitals reviewed.    ED Treatments / Results  Labs (all labs ordered are listed, but only abnormal results are displayed) Labs Reviewed  COMPREHENSIVE METABOLIC PANEL - Abnormal; Notable for the following:       Result Value   Chloride 100 (*)    All other components within normal limits  ACETAMINOPHEN LEVEL - Abnormal; Notable for the following:    Acetaminophen (Tylenol), Serum <10 (*)    All other components within normal limits  ETHANOL  SALICYLATE LEVEL  CBC  RAPID URINE DRUG SCREEN, HOSP PERFORMED    EKG  EKG Interpretation None       Radiology No results found.  Procedures Procedures (including critical care time)  Medications Ordered in ED Medications - No data to  display   Initial Impression / Assessment and Plan / ED Course  I have reviewed the triage vital signs and the nursing notes.  Pertinent labs & imaging results that were available during my care of the patient were reviewed by me and considered in my medical decision making (see chart for details).      Thomas [name] is a 21 y.o. male with a past medical history significant for substance abuse, depression, bipolar disorder, and anxiety who presents for suicidal ideation, homicidal ideation, and command hallucinations.  Patient reports that he was seen earlier today and was told he was safe for discharge however, patient reports that he has had worsening symptoms since discharge.  He says that he is still hearing evil voices that are telling him to kill himself and others.  He is telling him himself to overdose which he has tried to do in the past to kill himself.  It is telling him to kill others.  He reports no new substance abuse or alcohol use today.  He reports that he has no ability to follow-up with his care team.  He is homeless.  He says that the voices will not stop talking to him and he thinks that there is "no point in life".  He reports he is feeling worsening depression and wants help.  On exam, patient has clear lungs.  Abdomen and chest are nontender.  No focal neurologic deficits.    Patient will have screening laboratory testing and TTS will be called due to his report of worsening command hallucinations with SI and HI.  Anticipate following up on their recommendations.  Laboratory testing was reassuring.  Alcohol negative.  UDS clean.  Patient is medically cleared for psychiatric recommendations.  10:53 PM TTS called to report that after their discussion, patient was unable to contract for safety and still had suicidal ideations.  They are requesting the patient be watched overnight and they will be evaluating him and again in the morning.  Holding orders were placed  anticipate following up on their recommendations tomorrow.   Final Clinical Impressions(s) / ED Diagnoses   Final diagnoses:  Suicidal ideation  Homicidal ideation  History of command hallucinations    Clinical Impression: 1. Suicidal ideation   2. Homicidal ideation   3. History of command hallucinations     Disposition: Awaiting official psychiatric recommendations in the morning.   Rajean Desantiago, Canary Brimhristopher J, MD 08/03/17 [phone]

## 2017-08-03 NOTE — BH Assessment (Signed)
[facility] Assessment Progress Note  Per Mojeed Akintayo, MD, this pt does not require psychiatric hospitalization at this time.  Pt is to be discharged from [facility] with recommendation to continue treatment with the [facility].  This has been included in pt's discharge instructions.  Pt's nurse, Diane, has been notified.  Adilson Grafton, MA Triage Specialist [phone]     

## 2017-08-03 NOTE — BH Assessment (Addendum)
Assessment Note  Thomas [name] is an 21 y.o. male, who presents voluntary and unaccompanied to [facility]. Pt was assessed earlier this morning at [facility], with a similar presentation. Pt was a poor historian and was redirected to stay wake and engage in the assessment. Clinician asked the pt, "what brought you to the hospital?" Pt reported, hearing noises and has been suicidal for "a while now." Pt reported, about six months ago, he was homicidal towards anyone who messed with him. Pt denies, HI, self-injurious behaviors and access to weapons.   Pt denies abuse and substance use. Pt reported, drinking a bottle of Robitussin, yesterday. Pt's UDS is negative. Pt reported, being linked to [facility] (ACTT Team) for medication management and counseling. Pt reported, not taking his medication as prescribed. Pt reported, previous inpatient admissions. Pt reported, wanting to be inpatient. Pt reported,  his most recent inpatient admission he was only there for a few days.   Pt presents drowsy in scrubs with slurred,slow speech.  Initially, the pt began speaking incoherently. Pt's eye contact is poor. Pt's eye contact was poor. Pt's mood was helpless. Pt's affect was flat. Pt's thought process was circumstantial. Pt's insight was poor. Pt's impulse control was fair. Pt was oriented x3 (year, city and state.) Pt reported, if discharged from [facility] he could not contract for safety. Pt reported, if inpatient treatment is recommended he would sign-in voluntarily.     Diagnosis: Bipolar 1 disorder [facility])  Past Medical History:  Past Medical History:  Diagnosis Date  . ADHD (attention deficit hyperactivity disorder)   . Anxiety   . Bipolar 1 disorder (HCC)   . Current smoker   . Depression   . Eating disorder   . Headache(784.0)   . History of ADHD 11/01/2015  . Medical history non-contributory   . Mental disorder   . Nicotine dependence 11/03/2015  . Psychoactive substance-induced mood disorder (HCC) 10/28/2015     History reviewed. No pertinent surgical history.  Family History: History reviewed. No pertinent family history.  Social History:  reports that he has been smoking Cigarettes.  He has been smoking about 0.00 packs per day for the past 4.00 years. He has never used smokeless tobacco. He reports that he uses drugs, including Marijuana, Cocaine, LSD, and MDMA (Ecstacy). He reports that he does not drink alcohol.  Additional Social History:  Alcohol / Drug Use Pain Medications: See MAR  Prescriptions: See MAR  Over the Counter: See MAR History of alcohol / drug use?:  (Pt's UDS is negative. )  CIWA: CIWA-Ar BP: 129/77 Pulse Rate: 74 COWS:    Allergies: No Known Allergies  Home Medications:  (Not in a hospital admission)  OB/GYN Status:  No LMP for male patient.  General Assessment Data Location of Assessment: [facility] TTS Assessment: In system Is this a Tele or Face-to-Face Assessment?: Face-to-Face Is this an Initial Assessment or a Re-assessment for this encounter?: Initial Assessment Marital status: Single Is patient pregnant?: No Pregnancy Status: No Living Arrangements: Other (Comment) (Homeless) Can pt return to current living arrangement?: Yes Admission Status: Voluntary Is patient capable of signing voluntary admission?: Yes Referral Source: Self/Family/Friend Insurance type: Self-pay     Crisis Care Plan Living Arrangements: Other (Comment) (Homeless) Legal Guardian: Other: (Self) Name of Psychiatrist: Per chart, [facility]. Name of Therapist: Per chart, [facility].  Education Status Is patient currently in school?: No Current Grade: NA  Highest grade of school patient has completed: Pt reported, 10th grade.  Name of school: NA Contact  person: NA  Risk to self with the past 6 months Suicidal Ideation: Yes-Currently Present Has patient been a risk to self within the past 6 months prior to admission? : Yes Suicidal Intent: No Has patient had any suicidal intent  within the past 6 months prior to admission? : No Is patient at risk for suicide?: Yes Suicidal Plan?: No Has patient had any suicidal plan within the past 6 months prior to admission? : No Specify Current Suicidal Plan: NA Access to Means: No (Pt denies. ) Specify Access to Suicidal Means: NA What has been your use of drugs/alcohol within the last 12 months?: Pt's UDS is pending.  Previous Attempts/Gestures: Yes How many times?:  (UTA) Other Self Harm Risks: Pt denies.  Triggers for Past Attempts: Unknown Intentional Self Injurious Behavior: Cutting (Per chart. ) Comment - Self Injurious Behavior: Pt denies, however per pt's chart he cut himself a month ago.  Family Suicide History: Yes Recent stressful life event(s):  (Pt reported, hopelessness. ) Persecutory voices/beliefs?: No Depression: Yes (Pt denies. ) Depression Symptoms:  (hopelessness) Substance abuse history and/or treatment for substance abuse?: Yes Suicide prevention information given to non-admitted patients: Not applicable  Risk to Others within the past 6 months Homicidal Ideation: No-Not Currently/Within Last 6 Months (Pt reported, he wa homicidal about six months ago.) Does patient have any lifetime risk of violence toward others beyond the six months prior to admission? : No (Pt denies. ) Thoughts of Harm to Others: No Comment - Thoughts of Harm to Others: NA Current Homicidal Intent: No Current Homicidal Plan: No Access to Homicidal Means: No (Pt denies. ) Identified Victim: NA History of harm to others?: No Assessment of Violence: None Noted Violent Behavior Description: NA Does patient have access to weapons?: No (Pt denies. ) Criminal Charges Pending?: No Describe Pending Criminal Charges: NA Does patient have a court date: No Is patient on probation?: No  Psychosis Hallucinations: Auditory Delusions: None noted  Mental Status Report Appearance/Hygiene: In scrubs Eye Contact: Poor Motor Activity:  Unremarkable Speech: Slurred, Slow Level of Consciousness: Drowsy Mood: Helpless Affect: Flat Anxiety Level: None Thought Processes: Circumstantial Judgement: Partial Orientation: Other (Comment) (year, city and state. ) Obsessive Compulsive Thoughts/Behaviors: None  Cognitive Functioning Concentration: Normal Memory: Recent Intact IQ: Average Insight: Poor Impulse Control: Fair Appetite: Good Sleep: No Change Total Hours of Sleep: 8 (Pt reports.) Vegetative Symptoms: None  ADLScreening [facility] Assessment Services) Patient's cognitive ability adequate to safely complete daily activities?: Yes Patient able to express need for assistance with ADLs?: Yes Independently performs ADLs?: Yes (appropriate for developmental age)  Prior Inpatient Therapy Prior Inpatient Therapy: Yes Prior Therapy Dates: 2018 Prior Therapy Facilty/Provider(s): Per chart, [facility], [facility], [facility], [facility] Reason for Treatment: Per chart, MH issues.   Prior Outpatient Therapy Prior Outpatient Therapy: Yes Prior Therapy Dates: Current Prior Therapy Facilty/Provider(s): [facility] Reason for Treatment: Medication mangement and counseling.  Does patient have an ACCT team?: Yes Does patient have Intensive In-House Services?  : No Does patient have [facility] services? : No Does patient have P4CC services?: No  ADL Screening (condition at time of admission) Patient's cognitive ability adequate to safely complete daily activities?: Yes Is the patient deaf or have difficulty hearing?: No Does the patient have difficulty seeing, even when wearing glasses/contacts?: No Does the patient have difficulty concentrating, remembering, or making decisions?: Yes Patient able to express need for assistance with ADLs?: Yes Does the patient have difficulty dressing or bathing?: No Independently performs ADLs?: Yes (appropriate for  developmental age) Does the patient have difficulty walking or climbing stairs?: No Weakness of Legs:  None Weakness of Arms/Hands: None  Home Assistive Devices/Equipment Home Assistive Devices/Equipment: None    Abuse/Neglect Assessment (Assessment to be complete while patient is alone) Physical Abuse: Denies (Pt denies. ) Verbal Abuse: Denies (Pt denies. ) Sexual Abuse: Denies (Pt denies. ) Exploitation of patient/patient's resources: Denies (Pt denies. ) Self-Neglect: Denies (Pt denies. )     Advance Directives (For Healthcare) Does Patient Have a Medical Advance Directive?: No Would patient like information on creating a medical advance directive?: No - Patient declined    Additional Information 1:1 In Past 12 Months?: No CIRT Risk: No Elopement Risk: No Does patient have medical clearance?: Yes      Disposition: Nira Conn, NP recommends observe and monitor for safety and stabilization as the pt can not contract for safety. Disposition discussed with Dr. Rush Landmark and Leta Jungling, RN.    Disposition Initial Assessment Completed for this Encounter: Yes Disposition of Patient: Other dispositions (observe and monitor for safety and stabilizatio) Other disposition(s): Other (Comment) (observe and monitor for safety and stabilizatio)  On Site Evaluation by:   Reviewed with Physician: Dr. Rush Landmark and Nira Conn, NP  Redmond Pulling 08/03/2017 11:16 PM   Redmond Pulling, MS, [facility], [facility] Triage Specialist [phone]

## 2017-08-04 DIAGNOSIS — F1994 Other psychoactive substance use, unspecified with psychoactive substance-induced mood disorder: Secondary | ICD-10-CM

## 2017-08-04 NOTE — ED Notes (Signed)
Up in hall talking w/ other pt 

## 2017-08-04 NOTE — Consult Note (Signed)
[facility] Consult   Reason for Consult:  Substance abuse with suicidal ideations Referring Physician:  EDP Patient Identification: Thomas [name] MRN:  [id number] Principal Diagnosis: Substance induced mood disorder ([name]) Diagnosis:   Patient Active Problem List   Diagnosis Date Noted  . Cocaine abuse with cocaine-induced anxiety disorder [facility]) [F14.180] 05/01/2017    Priority: High  . Polysubstance abuse ([place]) [F19.10] 03/18/2017    Priority: High  . Substance induced mood disorder (Thomas [name]) [F19.94] 09/08/2016    Priority: High  . Polysubstance dependence ([name]) [F19.20] 07/17/2017  . Cocaine abuse ([name]) [F14.10]   . 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 Intentional drug overdose ([name]) [T50.902A] 12/05/2016  . Acute encephalopathy [G93.40] 12/05/2016  . Sinus tachycardia [R00.0] 12/05/2016  . Hypotension [I95.9] 12/05/2016  . Overdose, intentional self-harm, initial encounter ([name]) [T50.902A] 11/20/2016  . Intentional SSRI (selective serotonin reuptake inhibitor) overdose ([place]) [T43.222A] 11/20/2016  . Homelessness [Z59.0] 09/02/2016  . Attention deficit hyperactivity disorder (ADHD) [F90.9] 07/03/2016  . 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 Tobacco use disorder [F17.200] 07/03/2016  . Unspecified depressive  disorder [F32.9] 07/03/2016  . 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 Dextromethorphan overdose [T48.3X1A] 06/03/2016  . Leukocytosis [D72.829]   . Cannabis use disorder, moderate, dependence ([name]) [F12.20] 12/04/2012    Total Time spent with patient: 45 minutes  Subjective:   Thomas [name] is a 21 y.o. male patient does not warrant admission.  HPI:  21 yo male who presented to the ED after being discharged a few hours before with suicidal ideations.   He reported using cough syrup and cocaine the day before.  Thomas [name] is well known to this  ED and providers with similar presentations when he mother or girlfriend kick him out or he has court dates.  Homeless at this time and reports his ACT team will no longer come and get him from the ED (plethora of visits) and his has to go to their office.  [facility] called anyway and they are willing to come see him anytime.  No suicidal/homicidal ideations, hallucinations, and withdrawal symptoms.  Stable for discharge.  Caveat:  Larceny charges at this time  Past Psychiatric History: substance abuse, bipolar disorder  Risk to Self: None Risk to Others: None Prior Inpatient Therapy: Prior Inpatient Therapy: Yes Prior Therapy Dates: 2018 Prior Therapy Facilty/Provider(s): Per chart, [facility], [facility], [facility], [facility] Reason for Treatment: Per chart, MH issues.  Prior Outpatient Therapy: Prior Outpatient Therapy: Yes Prior Therapy Dates: Current Prior Therapy Facilty/Provider(s): [facility] Reason for Treatment: Medication mangement and counseling.  Does patient have an ACCT team?: Yes Does patient have Intensive In-House Services?  : No Does patient have [facility] services? : No Does patient have P4CC services?: No  Past Medical History:  Past Medical History:  Diagnosis Date  . ADHD (attention deficit hyperactivity disorder)   . Anxiety   . Bipolar 1 disorder (Thomas [name])   . Current smoker   . Depression   . Eating disorder   . Headache(784.0)   . History of ADHD 11/01/2015  . Medical history non-contributory   . Mental disorder   . Nicotine dependence 11/03/2015  . Psychoactive substance-induced mood disorder (Thomas [name]) 10/28/2015   History reviewed. No pertinent surgical history. Family History: History reviewed. No pertinent family history. Family Psychiatric  History: mother with mood disorder Social History:  History  Alcohol Use No     History  Drug Use  . Types:  Marijuana, Cocaine, LSD, MDMA (Ecstacy)    Comment: Pt reports using Molly as well and reports using these drugs     Social History   Social  History  . Marital status: Single    Spouse name: N/A  . Number of children: N/A  . Years of education: N/A   Social History Main Topics  . Smoking status: Current Some Day Smoker    Packs/day: 0.00    Years: 4.00    Types: Cigarettes  . Smokeless tobacco: Never Used  . Alcohol use No  . Drug use: Yes    Types: Marijuana, Cocaine, LSD, MDMA (Ecstacy)     Comment: Pt reports using Molly as well and reports using these drugs   . Sexual activity: Yes    Birth control/ protection: None   Other Topics Concern  . None   Social History Narrative  . None   Additional Social History:    Allergies:  No Known Allergies  Labs:  Results for orders placed or performed during the hospital encounter of 08/03/17 (from the past 48 hour(s))  Rapid urine drug screen (hospital performed)     Status: None   Collection Time: 08/03/17  8:56 PM  Result Value Ref Range   Opiates NONE DETECTED NONE DETECTED   Cocaine NONE DETECTED NONE DETECTED   Benzodiazepines NONE DETECTED NONE DETECTED   Amphetamines NONE DETECTED NONE DETECTED   Tetrahydrocannabinol NONE DETECTED NONE DETECTED   Barbiturates NONE DETECTED NONE DETECTED    Comment:        DRUG SCREEN FOR MEDICAL PURPOSES ONLY.  IF CONFIRMATION IS NEEDED FOR ANY PURPOSE, NOTIFY LAB WITHIN 5 DAYS.        LOWEST DETECTABLE LIMITS FOR URINE DRUG SCREEN Drug Class       Cutoff (ng/mL) Amphetamine      1000 Barbiturate      200 Benzodiazepine   902 Tricyclics       409 Opiates          300 Cocaine          300 THC              50   Comprehensive metabolic panel     Status: Abnormal   Collection Time: 08/03/17  9:08 PM  Result Value Ref Range   Sodium 139 135 - 145 mmol/L   Potassium 3.7 3.5 - 5.1 mmol/L   Chloride 100 (L) 101 - 111 mmol/L   CO2 28 22 - 32 mmol/L   Glucose, Bld 93 65 - 99 mg/dL   BUN 16 6 - 20 mg/dL   Creatinine, Ser 0.83 0.61 - 1.24 mg/dL   Calcium 9.8 8.9 - 10.3 mg/dL   Total Protein 8.0 6.5 - 8.1 g/dL    Albumin 4.7 3.5 - 5.0 g/dL   AST 20 15 - 41 U/L   ALT 31 17 - 63 U/L   Alkaline Phosphatase 75 38 - 126 U/L   Total Bilirubin 0.5 0.3 - 1.2 mg/dL   GFR calc non Af Amer >60 >60 mL/min   GFR calc Af Amer >60 >60 mL/min    Comment: (NOTE) The eGFR has been calculated using the CKD EPI equation. This calculation has not been validated in all clinical situations. eGFR's persistently <60 mL/min signify possible Chronic Kidney Disease.    Anion gap 11 5 - 15  Ethanol     Status: None   Collection Time: 08/03/17  9:08 PM  Result Value Ref Range   Alcohol,  Ethyl (B) <10 <10 mg/dL    Comment:        LOWEST DETECTABLE LIMIT FOR SERUM ALCOHOL IS 10 mg/dL FOR MEDICAL PURPOSES ONLY   Salicylate level     Status: None   Collection Time: 08/03/17  9:08 PM  Result Value Ref Range   Salicylate Lvl <5.6 2.8 - 30.0 mg/dL  Acetaminophen level     Status: Abnormal   Collection Time: 08/03/17  9:08 PM  Result Value Ref Range   Acetaminophen (Tylenol), Serum <10 (L) 10 - 30 ug/mL    Comment:        THERAPEUTIC CONCENTRATIONS VARY SIGNIFICANTLY. A RANGE OF 10-30 ug/mL MAY BE AN EFFECTIVE CONCENTRATION FOR MANY PATIENTS. HOWEVER, SOME ARE BEST TREATED AT CONCENTRATIONS OUTSIDE THIS RANGE. ACETAMINOPHEN CONCENTRATIONS >150 ug/mL AT 4 HOURS AFTER INGESTION AND >50 ug/mL AT 12 HOURS AFTER INGESTION ARE OFTEN ASSOCIATED WITH TOXIC REACTIONS.   cbc     Status: None   Collection Time: 08/03/17  9:08 PM  Result Value Ref Range   WBC 10.1 4.0 - 10.5 K/uL   RBC 4.73 4.22 - 5.81 MIL/uL   Hemoglobin 15.0 13.0 - 17.0 g/dL   HCT 41.8 39.0 - 52.0 %   MCV 88.4 78.0 - 100.0 fL   MCH 31.7 26.0 - 34.0 pg   MCHC 35.9 30.0 - 36.0 g/dL   RDW 12.1 11.5 - 15.5 %   Platelets 244 150 - 400 K/uL    No current facility-administered medications for this encounter.    Current Outpatient Prescriptions  Medication Sig Dispense Refill  . methylphenidate (RITALIN) 10 MG tablet Take 10 mg by mouth 2 (two) times  daily.    . paliperidone (INVEGA SUSTENNA) 234 MG/1.5ML SUSP injection Inject 234 mg into the muscle every 30 (thirty) days.      Musculoskeletal: Strength & Muscle Tone: within normal limits Gait & Station: normal Patient leans: N/A  Psychiatric Specialty Exam: Physical Exam  Constitutional: He is oriented to person, place, and time. He appears well-developed and well-nourished.  HENT:  Head: Normocephalic.  Neck: Normal range of motion.  Respiratory: Effort normal.  Musculoskeletal: Normal range of motion.  Neurological: He is alert and oriented to person, place, and time.  Psychiatric: His speech is normal and behavior is normal. Judgment and thought content normal. His affect is blunt. Cognition and memory are normal.    Review of Systems  Psychiatric/Behavioral: Positive for substance abuse.  All other systems reviewed and are negative.   Blood pressure 131/76, pulse 70, temperature 98.2 F (36.8 C), resp. rate 18, SpO2 99 %.There is no height or weight on file to calculate BMI.  General Appearance: Casual  Eye Contact:  Good  Speech:  Normal Rate  Volume:  Normal  Mood:  Euthymic  Affect:  Congruent  Thought Process:  Coherent and Descriptions of Associations: Intact  Orientation:  Full (Time, Place, and Person)  Thought Content:  WDL and Logical  Suicidal Thoughts:  No  Homicidal Thoughts:  No  Memory:  Immediate;   Good Recent;   Good Remote;   Good  Judgement:  Fair  Insight:  Fair  Psychomotor Activity:  Normal  Concentration:  Concentration: Good and Attention Span: Good  Recall:  Good  Fund of Knowledge:  Fair  Language:  Good  Akathisia:  No  Handed:  Right  AIMS (if indicated):     Assets:  Leisure Time Physical Health Resilience Social Support  ADL's:  Intact  Cognition:  WNL  Sleep:        Treatment Plan Summary: Daily contact with patient to assess and evaluate symptoms and progress in treatment, Medication management and Plan substance  induced mood disorder:  -Crisis stabilization -Medication management:  None started, needed to maintain his mental clarity, Invega long term injection provided by his ACT team in the outpatient facility -Individual and substance abuse counseling  Disposition: No evidence of imminent risk to self or others at present.    Waylan Boga, NP 08/04/2017 10:43 AM   Patient seen, chart reviewed and case discussed with the treatment team for this face-to-face psychiatric evaluation and developed treatment plan. Reviewed the information documented and agree with the treatment plan.  Ambrose Finland, MD 08/04/2017 7:07 PM

## 2017-08-04 NOTE — ED Notes (Addendum)
Written dc instructions reviewed w/ pt.  Pt encouraged to follow up with his ACT team and OP provider and continue to take his medication.  Pt verbalized understanding.   Pt ambulatory w/o difficulty on dc.

## 2017-08-04 NOTE — ED Notes (Signed)
Bed: [id number] Expected date:  Expected time:  Means of arrival:  Comments: [name]

## 2017-08-04 NOTE — [facility] Suicide Risk Assessment (Signed)
Suicide Risk Assessment  Discharge Assessment   [facility] Discharge Suicide Risk Assessment   Principal Problem: Substance induced mood disorder [facility]) Discharge Diagnoses:  Patient Active Problem List   Diagnosis Date Noted  . Cocaine abuse with cocaine-induced anxiety disorder [facility]) [F14.180] 05/01/2017    Priority: High  . Polysubstance abuse (HCC) [F19.10] 03/18/2017    Priority: High  . Substance induced mood disorder (HCC) [F19.94] 09/08/2016    Priority: High  . Polysubstance dependence (HCC) [F19.20] 07/17/2017  . Cocaine abuse ([facility]CC) [F14.10]   . 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 Intentional drug overdose ([facility]) [T50.902A] 12/05/2016  . Acute encephalopathy [G93.40] 12/05/2016  . Sinus tachycardia [R00.0] 12/05/2016  . Hypotension [I95.9] 12/05/2016  . Overdose, intentional self-harm, initial encounter ([facility]CC) [T50.902A] 11/20/2016  . Intentional SSRI (selective serotonin reuptake inhibitor) overdose (HCC) [T43.222A] 11/20/2016  . Homelessness [Z59.0] 09/02/2016  . Attention deficit hyperactivity disorder (ADHD) [F90.9] 07/03/2016  . 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 Tobacco use disorder [F17.200] 07/03/2016  . Unspecified depressive  disorder [F32.9] 07/03/2016  . 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 Dextromethorphan overdose [T48.3X1A] 06/03/2016  . Leukocytosis [D72.829]   . Cannabis use disorder, moderate, dependence (HCC) [F12.20] 12/04/2012    Total Time spent with patient: 45 minutes  Musculoskeletal: Strength & Muscle Tone: within normal limits Gait & Station: normal Patient leans: N/A  Psychiatric Specialty Exam: Physical Exam  Constitutional: He is oriented to person, place, and time. He appears well-developed and well-nourished.  HENT:  Head: Normocephalic.  Neck: Normal range of motion.  Respiratory: Effort normal.  Musculoskeletal: Normal  range of motion.  Neurological: He is alert and oriented to person, place, and time.  Psychiatric: His speech is normal and behavior is normal. Judgment and thought content normal. His affect is blunt. Cognition and memory are normal.    Review of Systems  Psychiatric/Behavioral: Positive for substance abuse.  All other systems reviewed and are negative.   Blood pressure 131/76, pulse 70, temperature 98.2 F (36.8 C), resp. rate 18, SpO2 99 %.There is no height or weight on file to calculate BMI.  General Appearance: Casual  Eye Contact:  Good  Speech:  Normal Rate  Volume:  Normal  Mood:  Euthymic  Affect:  Congruent  Thought Process:  Coherent and Descriptions of Associations: Intact  Orientation:  Full (Time, Place, and Person)  Thought Content:  WDL and Logical  Suicidal Thoughts:  No  Homicidal Thoughts:  No  Memory:  Immediate;   Good Recent;   Good Remote;   Good  Judgement:  Fair  Insight:  Fair  Psychomotor Activity:  Normal  Concentration:  Concentration: Good and Attention Span: Good  Recall:  Good  Fund of Knowledge:  Fair  Language:  Good  Akathisia:  No  Handed:  Right  AIMS (if indicated):     Assets:  Leisure Time Physical Health Resilience Social Support  ADL's:  Intact  Cognition:  WNL  Sleep:       Mental Status Per Nursing Assessment::   On Admission:   substance abuse with suicidal ideations  Demographic Factors:  Male, Adolescent or young adult and Caucasian  Loss Factors: Legal issues  Historical Factors: NA  Risk Reduction Factors:   Sense of responsibility to family, Positive social support and Positive therapeutic relationship  Continued Clinical Symptoms:  None  Cognitive Features That Contribute To Risk:  None    Suicide Risk:  Minimal:  No identifiable suicidal ideation.  Patients presenting with no risk factors but with morbid ruminations; may be classified as minimal risk based on the severity of the depressive  symptoms    Plan Of Care/Follow-up recommendations:  Activity:  as tolerated Diet:  heart healthy diet  Trystin Hargrove, NP 08/04/2017, 11:02 AM

## 2017-08-04 NOTE — ED Notes (Signed)
SBAR Report received from previous nurse. Pt received calm and visible on unit. Pt denies current HI, A/V H, depression, anxiety, or pain at this time, and appears otherwise stable and free of distress. Pt reminded of camera surveillance, q 15 min rounds, and rules of the milieu. Will continue to assess.

## 2017-08-06 ENCOUNTER — Emergency Department (HOSPITAL_COMMUNITY)
Admission: EM | Admit: 2017-08-06 | Discharge: 2017-08-07 | Disposition: A | Discharge status: Home / Self Care | Payer: Medicaid Other | Attending: Emergency Medicine | Admitting: Emergency Medicine

## 2017-08-06 ENCOUNTER — Encounter (HOSPITAL_COMMUNITY): Payer: Self-pay | Admitting: *Deleted

## 2017-08-06 DIAGNOSIS — F19959 Other psychoactive substance use, unspecified with psychoactive substance-induced psychotic disorder, unspecified: Secondary | ICD-10-CM | POA: Diagnosis not present

## 2017-08-06 DIAGNOSIS — T483X2A Poisoning by antitussives, intentional self-harm, initial encounter: Secondary | ICD-10-CM | POA: Diagnosis not present

## 2017-08-06 DIAGNOSIS — Z79899 Other long term (current) drug therapy: Secondary | ICD-10-CM | POA: Diagnosis not present

## 2017-08-06 DIAGNOSIS — Z590 Homelessness: Secondary | ICD-10-CM | POA: Insufficient documentation

## 2017-08-06 DIAGNOSIS — R45851 Suicidal ideations: Secondary | ICD-10-CM

## 2017-08-06 DIAGNOSIS — F902 Attention-deficit hyperactivity disorder, combined type: Secondary | ICD-10-CM | POA: Insufficient documentation

## 2017-08-06 DIAGNOSIS — F17210 Nicotine dependence, cigarettes, uncomplicated: Secondary | ICD-10-CM | POA: Diagnosis not present

## 2017-08-06 DIAGNOSIS — T50902A Poisoning by unspecified drugs, medicaments and biological substances, intentional self-harm, initial encounter: Secondary | ICD-10-CM | POA: Diagnosis present

## 2017-08-06 DIAGNOSIS — R440 Auditory hallucinations: Secondary | ICD-10-CM | POA: Insufficient documentation

## 2017-08-06 DIAGNOSIS — F22 Delusional disorders: Secondary | ICD-10-CM | POA: Insufficient documentation

## 2017-08-06 DIAGNOSIS — F1410 Cocaine abuse, uncomplicated: Secondary | ICD-10-CM | POA: Diagnosis not present

## 2017-08-06 DIAGNOSIS — F1920 Other psychoactive substance dependence, uncomplicated: Secondary | ICD-10-CM | POA: Diagnosis not present

## 2017-08-06 LAB — COMPREHENSIVE METABOLIC PANEL
ALK PHOS: 71 U/L (ref 38–126)
ALT: 24 U/L (ref 17–63)
AST: 16 U/L (ref 15–41)
Albumin: 4.4 g/dL (ref 3.5–5.0)
Anion gap: 12 (ref 5–15)
BUN: 8 mg/dL (ref 6–20)
CO2: 28 mmol/L (ref 22–32)
Calcium: 9.8 mg/dL (ref 8.9–10.3)
Chloride: 101 mmol/L (ref 101–111)
Creatinine, Ser: 0.63 mg/dL (ref 0.61–1.24)
Glucose, Bld: 116 mg/dL — ABNORMAL HIGH (ref 65–99)
Potassium: 3.3 mmol/L — ABNORMAL LOW (ref 3.5–5.1)
Sodium: 141 mmol/L (ref 135–145)
Total Bilirubin: 0.6 mg/dL (ref 0.3–1.2)
Total Protein: 7.4 g/dL (ref 6.5–8.1)

## 2017-08-06 LAB — ETHANOL: Alcohol, Ethyl (B): <10 mg/dL (ref <10)

## 2017-08-06 LAB — CBC
HEMATOCRIT: 40.3 % (ref 39.0–52.0)
HEMOGLOBIN: 14.6 g/dL (ref 13.0–17.0)
MCH: 31.8 pg (ref 26.0–34.0)
MCHC: 36.2 g/dL — ABNORMAL HIGH (ref 30.0–36.0)
MCV: 87.8 fL (ref 78.0–100.0)
Platelets: 190 10*3/uL (ref 150–400)
RBC: 4.59 MIL/uL (ref 4.22–5.81)
RDW: 12.4 % (ref 11.5–15.5)
WBC: 9.4 10*3/uL (ref 4.0–10.5)

## 2017-08-06 LAB — RAPID URINE DRUG SCREEN, HOSP PERFORMED
AMPHETAMINES: NOT DETECTED
BARBITURATES: NOT DETECTED
BENZODIAZEPINES: NOT DETECTED
COCAINE: NOT DETECTED
Opiates: NOT DETECTED
Tetrahydrocannabinol: NOT DETECTED

## 2017-08-06 LAB — MAGNESIUM: Magnesium: 2 mg/dL (ref 1.7–2.4)

## 2017-08-06 LAB — ACETAMINOPHEN LEVEL

## 2017-08-06 LAB — SALICYLATE LEVEL: Salicylate Lvl: <7 mg/dL (ref 2.8–30.0)

## 2017-08-06 MED ORDER — SODIUM CHLORIDE 0.9 % IV BOLUS (SEPSIS)
1000.0000 mL | Freq: Once | INTRAVENOUS | Status: AC
  Administered 2017-08-06: 1000 mL via INTRAVENOUS

## 2017-08-06 MED ORDER — ONDANSETRON HCL 4 MG/2ML IJ SOLN
4.0000 mg | Freq: Once | INTRAMUSCULAR | Status: AC
  Administered 2017-08-06: 4 mg via INTRAVENOUS
  Filled 2017-08-06: qty 2

## 2017-08-06 NOTE — Progress Notes (Addendum)
CSW noted in pt's chart he has had approx 36 ED admissions/ED visits since 09/17/17.  CSW made a note in the chart and created a "flag" stating he has a payee.  CSW completed an assessment.  Please reconsult if future social work needs arise.  CSW signing off, as social work intervention is no longer needed.  CSW called [facility]PS and filed a report specifically regarding pt's self-neglect and the unknown direction of where the pt's additional money that he is not being paid each month is being sent, or to see/confirm if it is being "held" by [employer] Payee in a safe manner.  CSW will leave a handoff for the daytime ED CSW.  Dorothe PeaJonathan F. Yanel Dombrosky, LCSW, LCAS, CSI Clinical Social Worker Ph: [phone]

## 2017-08-06 NOTE — ED Notes (Signed)
Informed Abigail, PA-C, pt is vomiting.

## 2017-08-06 NOTE — Progress Notes (Addendum)
CSW spoke with EDP and pt;s RN and was informed a consult will be placed for the CSW.  Per EDP and RN pt states he "gets a check through a payee". CSW reviewed pt's chart and saw no insurance is listed.  CSW spoke to registration who investigated and found pt has "[place] [employer].  Per note from disposition on 07/17/17 pt was [facility] "to continue treatment with the [facility]".    CSW will continue to follow.  Dorothe PeaJonathan F. Trip Cavanagh, LCSW, LCAS, CSI Clinical Social Worker Ph: [phone]

## 2017-08-06 NOTE — ED Triage Notes (Signed)
Pt stated "I just got out of jail today.  I have no where to go.  I have a court date on the 5th.  I stole 5 boxes of dextromethorphan and a bottle of cough syrup."

## 2017-08-06 NOTE — ED Provider Notes (Signed)
[facility]-EMERGENCY DEPT Provider Note   CSN: [id number] Arrival date & time: 08/06/17  2022     History   Chief Complaint Chief Complaint  Patient presents with  . Suicidal    HPI Thomas [name] is a 21 y.o. male who presents emergency Department with complaint of suicidal ideation and attempt by overdose this evening. He is a frequent ER visitor. Patient states that today he took 5 box falls of Mucinex D and drank a bottle of Delsym and attempt to kill himself. He states that he feels like he just cannot live on his own because the world is too stressful. He says that he hears voices and feels that people are talking about him and he doesn't understand why. He states that his family will no longer talk to him. He is currently homeless and does not have a shelter. The patient states that he feels like he stopped in a game and that "isn't even 1 every time he tries escapee just gets some deeper into the skin. He is on a monthly shot of Invega. He states that he has not using any drugs or alcohol because he feels like there is no use in using them. The patient denies homicidal ideation. He took the medication about 3 hours prior to arrival.  HPI  Past Medical History:  Diagnosis Date  . ADHD (attention deficit hyperactivity disorder)   . Anxiety   . Bipolar 1 disorder (HCC)   . Current smoker   . Depression   . Eating disorder   . Headache(784.0)   . History of ADHD 11/01/2015  . Medical history non-contributory   . Mental disorder   . Nicotine dependence 11/03/2015  . 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 Polysubstance dependence (HCC) 07/17/2017  . Cocaine abuse (HCC)   . 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 Polysubstance abuse (HCC)  03/18/2017  . Intentional drug overdose (HCC) 12/05/2016  . Acute encephalopathy 12/05/2016  . Sinus tachycardia 12/05/2016  . Hypotension 12/05/2016  . Overdose, intentional self-harm, initial encounter (HCC) 11/20/2016  . Intentional SSRI (selective serotonin reuptake inhibitor) overdose (HCC) 11/20/2016  . Substance induced mood disorder (HCC) 09/08/2016  . Homelessness 09/02/2016  . Attention deficit hyperactivity disorder (ADHD) 07/03/2016  . 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 Tobacco use disorder 07/03/2016  . Unspecified depressive  disorder 07/03/2016  . 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 Dextromethorphan overdose 06/03/2016  . Leukocytosis   . Cannabis use disorder, moderate, dependence (HCC) 12/04/2012    History reviewed. No pertinent surgical history.     Home Medications    Prior to Admission medications   Medication Sig Start Date End Date Taking? Authorizing Provider  methylphenidate (RITALIN) 10 MG tablet Take 10 mg by mouth 2 (two) times daily. 08/01/17 08/31/17  [provider]  paliperidone (INVEGA SUSTENNA) 234 MG/1.5ML SUSP injection Inject 234 mg into the muscle every 30 (thirty) days.    [provider]    Family History No family history on file.  Social History Social History  Substance Use Topics  . Smoking status: Current Some Day Smoker    Packs/day: 0.00    Years: 4.00    Types: Cigarettes  . Smokeless tobacco: Never Used  . Alcohol use No     Allergies  Patient has no known allergies.   Review of Systems Review of Systems Ten systems reviewed and are negative for acute change, except as noted in the HPI.    Physical Exam Updated Vital Signs BP 131/83 (BP Location: Left Arm)   Pulse 82   Temp 98.5 F (36.9 C) (Oral)   Resp 18   Ht 5\' 6"  (1.676 m)   Wt 72.6 kg (160 lb)   SpO2 100%   BMI 25.82 kg/m   Physical Exam  Constitutional: He appears well-developed and  well-nourished. No distress.  HENT:  Head: Normocephalic and atraumatic.  Eyes: Conjunctivae are normal. No scleral icterus.  Neck: Normal range of motion. Neck supple.  Cardiovascular: Normal rate, regular rhythm and normal heart sounds.   Pulmonary/Chest: Effort normal and breath sounds normal. No respiratory distress.  Abdominal: Soft. There is no tenderness.  Musculoskeletal: He exhibits no edema.  Neurological: He is alert.  Skin: Skin is warm and dry. He is not diaphoretic.  Psychiatric: His mood appears anxious. He is slowed and withdrawn. Thought content is delusional. He exhibits a depressed mood. He expresses suicidal ideation. He expresses suicidal plans.  Nursing note and vitals reviewed.    ED Treatments / Results  Labs (all labs ordered are listed, but only abnormal results are displayed) Labs Reviewed  CBC - Abnormal; Notable for the following:       Result Value   MCHC 36.2 (*)    All other components within normal limits  COMPREHENSIVE METABOLIC PANEL  ETHANOL  SALICYLATE LEVEL  ACETAMINOPHEN LEVEL  RAPID URINE DRUG SCREEN, HOSP PERFORMED    EKG  EKG Interpretation None       Radiology No results found.  Procedures Procedures (including critical care time)  Medications Ordered in ED Medications - No data to display   Initial Impression / Assessment and Plan / ED Course  I have reviewed the triage vital signs and the nursing notes.  Pertinent labs & imaging results that were available during my care of the patient were reviewed by me and considered in my medical decision making (see chart for details).     21 year old male with suicide attempt by overdose on medication. I discussed the case with [place] poison control who recommends 8 hours of cardiac monitoring, monitor electrolytes, fluid resuscitation, supportive care with benzodiazepines and anti-emetics if necessary. No call a Child psychotherapistsocial worker consult given his other social issues. Patient  will need to TTS consult in the morning. I [facility]  Final Clinical Impressions(s) / ED Diagnoses   Final diagnoses:  None    New Prescriptions New Prescriptions   No medications on file     Arthor CaptainHarris, Itzell Bendavid, PA-C 08/06/17 2256    Shaune PollackIsaacs, [place], MD 08/07/17 1224

## 2017-08-06 NOTE — Clinical Social Work Note (Addendum)
Clinical Social Work Assessment  Patient Details  Name: Thomas [name] MRN: [id number] Date of Birth: 1996/03/10  Date of referral:  08/06/17               Reason for consult:  Housing Concerns/Homelessness, Substance Use/ETOH Abuse                Permission sought to share information with:    Permission granted to share information::  Yes, Verbal Permission Granted  Name::        Agency::     Relationship::     Contact Information:     Housing/Transportation Living arrangements for the past 2 months:  Homeless Source of Information:  Patient Patient Interpreter Needed:  None Criminal Activity/Legal Involvement Pertinent to Current Situation/Hospitalization:    Significant Relationships:   (Pt's [place] Payee) Lives with:  Self Do you feel safe going back to the place where you live?  No Need for family participation in patient care:  No (Coment)  Care giving concerns:  Pt states he is homeless and received $75 a week from his payee [place] Payee. PT STATES HIS CHECK IS $500 A MONTH BUT DOESN'T GET BUT $300 A MONTH FROM HIS PAYEE.  When asked pt states he doesn't know what happens with the rest of the money.  Pt states he has an Agricultural consultant ([facility]), but has not been closely involved in awhile, stating, "I don't know" in answer to the question when have you seen them?" Pt stated he was trying to "kill himself" by overdosing on cough medicine he had stolen.  CSW provided pt with a list of [employer] and pt was interested in calling them to seek admission by interviewing at one that had an open bed.   Social Worker assessment / plan:  CSW met with pt and confirmed pt's plan to be discharged to an [employer] to live when discharged.  CSW provided active listening and validated pt's concerns he was suicidal.  Pt has been homeless prior to being admitted to [facility].    Employment status:  Disabled (Comment on whether or not currently receiving  Disability) Insurance information:  Medicaid In [place] PT Recommendations:  Not assessed at this time Information / Referral to community resources:     Patient/Family's Response to care:  Patient alert and oriented, but not feeling well.  Patient agreeable to plan.  Pt's states he has no one supportive and strongly involved in pt.'s care.  Pt presented as sick, but pleasant and appreciated CSW intervention.     Patient/Family's Understanding of and Emotional Response to Diagnosis, Current Treatment, and Prognosis:  Still assessing   Emotional Assessment Appearance:  Appears stated age Attitude/Demeanor/Rapport:    Affect (typically observed):  Accepting, Adaptable, Pleasant, Calm Orientation:  Oriented to Self, Oriented to Place, Oriented to  Time, Oriented to Situation Alcohol / Substance use:  Alcohol Use, Illicit Drugs Psych involvement (Current and /or in the community):  Yes (Comment)  Discharge Needs  Concerns to be addressed:  Care Coordination Readmission within the last 30 days:  Yes Current discharge risk:  Substance Abuse, Lack of support system Barriers to Discharge:  No Barriers Identified   Claudine Mouton, LCSWA 08/06/2017, 11:16 PM

## 2017-08-06 NOTE — ED Notes (Signed)
Pt remains on monitor. 

## 2017-08-06 NOTE — ED Notes (Addendum)
Pt has been seen by Christiane HaJonathan, CSW.  Pt remains on cardiac monitoring.

## 2017-08-06 NOTE — ED Provider Notes (Signed)
Care assumed from [facility] Ltd[facility]ail harris, [place]PA-C.  Thomas OliphantCaleb Vallery [name] is a 21 y.o. male presents after intentional overdose with Mucinex DM and Delsym 3 hours PTA.  Pt reports taking full boxes of Mucinex DM and drank an entire bottle of Delsym.  He reports he cannot live on his own because the world is too stressful.  He reports hearing voices and people constantly talking about him.  He reports no family support network and is homeless.  He reports compliance with his monthly Invega injections. Pt denies drug and EtOH usage.  He denies HI at this time.    Physical Exam  BP 131/83 (BP Location: Left Arm)   Pulse 82   Temp 98.5 F (36.9 C) (Oral)   Resp 18   Ht 5\' 6"  (1.676 m)   Wt 72.6 kg (160 lb)   SpO2 100%   BMI 25.82 kg/m   Physical Exam  Constitutional: He appears well-developed and well-nourished. No distress.  HENT:  Head: Normocephalic.  Eyes: Conjunctivae are normal. No scleral icterus.  Neck: Normal range of motion.  Cardiovascular: Normal rate and intact distal pulses.   Pulmonary/Chest: Effort normal.  Abdominal: Soft. There is no tenderness.  Musculoskeletal: Normal range of motion.  Neurological: He is alert.  Skin: Skin is warm and dry.  Nursing note and vitals reviewed.   ED Course  Procedures  Clinical Course as of Aug 07 512  Mon Aug 06, 2017  2324 Plan:  Continue 6 hr obs.  If patient remains stable, will consult TTS in the morning.    [HM]  Tue Aug 07, 2017  [id number] Pt is well appearing on repeat exam.  No abd tenderness.  No AMS.  Pt has tolerate PO without difficulty.  [HM]    Clinical Course User Index [HM] Pearly Bartosik, Boyd KerbsHannah, PA-C     MDM  Patient presents with suicidal ideation and overdose of dextromethorphan.  He has been cleared by poison control and is well appearing on my repeat exam.  Pt will need TTS consult.     Suicidal ideation  Intentional drug overdose, initial encounter [facility](HCC)        Mckinzee Spirito, Boyd KerbsHannah, PA-C 08/07/17  [id number]    Shaune PollackIsaacs, Cameron, MD 08/07/17 1224

## 2017-08-07 ENCOUNTER — Emergency Department (HOSPITAL_COMMUNITY)
Admission: EM | Admit: 2017-08-07 | Discharge: 2017-08-08 | Disposition: A | Discharge status: Home / Self Care | Payer: Medicaid Other | Source: Home / Self Care | Attending: Emergency Medicine | Admitting: Emergency Medicine

## 2017-08-07 ENCOUNTER — Encounter (HOSPITAL_COMMUNITY): Payer: Self-pay

## 2017-08-07 DIAGNOSIS — F22 Delusional disorders: Secondary | ICD-10-CM | POA: Diagnosis not present

## 2017-08-07 DIAGNOSIS — F19959 Other psychoactive substance use, unspecified with psychoactive substance-induced psychotic disorder, unspecified: Secondary | ICD-10-CM | POA: Diagnosis not present

## 2017-08-07 DIAGNOSIS — F419 Anxiety disorder, unspecified: Secondary | ICD-10-CM | POA: Diagnosis not present

## 2017-08-07 DIAGNOSIS — R45851 Suicidal ideations: Secondary | ICD-10-CM

## 2017-08-07 DIAGNOSIS — R440 Auditory hallucinations: Secondary | ICD-10-CM

## 2017-08-07 DIAGNOSIS — F1210 Cannabis abuse, uncomplicated: Secondary | ICD-10-CM | POA: Diagnosis not present

## 2017-08-07 DIAGNOSIS — R4587 Impulsiveness: Secondary | ICD-10-CM | POA: Diagnosis not present

## 2017-08-07 DIAGNOSIS — F1410 Cocaine abuse, uncomplicated: Secondary | ICD-10-CM

## 2017-08-07 DIAGNOSIS — R450 Nervousness: Secondary | ICD-10-CM | POA: Diagnosis not present

## 2017-08-07 DIAGNOSIS — F17210 Nicotine dependence, cigarettes, uncomplicated: Secondary | ICD-10-CM

## 2017-08-07 LAB — ACETAMINOPHEN LEVEL

## 2017-08-07 LAB — COMPREHENSIVE METABOLIC PANEL
ALBUMIN: 4.6 g/dL (ref 3.5–5.0)
ALT: 23 U/L (ref 17–63)
ANION GAP: 10 (ref 5–15)
AST: 15 U/L (ref 15–41)
Alkaline Phosphatase: 73 U/L (ref 38–126)
BILIRUBIN TOTAL: 0.7 mg/dL (ref 0.3–1.2)
BUN: 12 mg/dL (ref 6–20)
CHLORIDE: 102 mmol/L (ref 101–111)
CO2: 25 mmol/L (ref 22–32)
Calcium: 9.8 mg/dL (ref 8.9–10.3)
Creatinine, Ser: 0.85 mg/dL (ref 0.61–1.24)
GFR calc Af Amer: >60 mL/min (ref >60)
GFR calc non Af Amer: >60 mL/min (ref >60)
GLUCOSE: 86 mg/dL (ref 65–99)
POTASSIUM: 4 mmol/L (ref 3.5–5.1)
SODIUM: 137 mmol/L (ref 135–145)
TOTAL PROTEIN: 7.5 g/dL (ref 6.5–8.1)

## 2017-08-07 LAB — ETHANOL: Alcohol, Ethyl (B): <10 mg/dL (ref <10)

## 2017-08-07 LAB — RAPID URINE DRUG SCREEN, HOSP PERFORMED
AMPHETAMINES: NOT DETECTED
BARBITURATES: NOT DETECTED
BENZODIAZEPINES: NOT DETECTED
COCAINE: NOT DETECTED
Opiates: NOT DETECTED
Tetrahydrocannabinol: NOT DETECTED

## 2017-08-07 LAB — CBC
HEMATOCRIT: 40.9 % (ref 39.0–52.0)
HEMOGLOBIN: 14.5 g/dL (ref 13.0–17.0)
MCH: 31.4 pg (ref 26.0–34.0)
MCHC: 35.5 g/dL (ref 30.0–36.0)
MCV: 88.5 fL (ref 78.0–100.0)
Platelets: 223 10*3/uL (ref 150–400)
RBC: 4.62 MIL/uL (ref 4.22–5.81)
RDW: 12.8 % (ref 11.5–15.5)
WBC: 8.9 10*3/uL (ref 4.0–10.5)

## 2017-08-07 LAB — SALICYLATE LEVEL: Salicylate Lvl: <7 mg/dL (ref 2.8–30.0)

## 2017-08-07 MED ORDER — LORAZEPAM 1 MG PO TABS
1.0000 mg | ORAL_TABLET | ORAL | Status: AC | PRN
  Administered 2017-08-07: 1 mg via ORAL
  Filled 2017-08-07: qty 1

## 2017-08-07 MED ORDER — ALUM & MAG HYDROXIDE-SIMETH 200-200-20 MG/5ML PO SUSP: 30.0000 mL | Freq: Four times a day (QID) | ORAL | Status: DC | PRN

## 2017-08-07 MED ORDER — ZIPRASIDONE MESYLATE 20 MG IM SOLR: 20.0000 mg | INTRAMUSCULAR | Status: DC | PRN

## 2017-08-07 MED ORDER — IBUPROFEN 200 MG PO TABS: 600.0000 mg | ORAL_TABLET | Freq: Three times a day (TID) | ORAL | Status: DC | PRN

## 2017-08-07 MED ORDER — RISPERIDONE 1 MG PO TBDP: 2.0000 mg | ORAL_TABLET | Freq: Three times a day (TID) | ORAL | Status: DC | PRN

## 2017-08-07 NOTE — BH Assessment (Signed)
[facility] Assessment Progress Note  Per Thedore MinsMojeed Akintayo, MD, this pt does not require psychiatric hospitalization at this time.  [facility]  This has been included in pt's discharge instructions.  Pt would also benefit from seeing Peer Support Specialists; they will be asked to speak to pt.  Pt's nurse, Lincoln MaxinOlivette, has been notified.  Doylene Canninghomas Kyndell Zeiser, MA Triage Specialist [phone]

## 2017-08-07 NOTE — ED Triage Notes (Signed)
Pt endorses hallucinations and states "i keep hearing voices telling me to kill myself and I have been going in stores an stealing stuff, and I think I need to be on new medications" Pt endorses SI stating "I wanna kill myself any way I can" VSS. Pt alert and oriented x 4.
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
## 2017-08-07 NOTE — ED Provider Notes (Signed)
[facility] EMERGENCY DEPARTMENT Provider Note   CSN: [id number] Arrival date & time: 08/07/17  1958     History   Chief Complaint Chief Complaint  Patient presents with  . Suicidal  . Hallucinations    HPI Thomas [name] is a 21 y.o. male.  Patient presents to the emergency department stating that he is suicidal.  Patient reports that he is afraid of himself and what he will do.  He is evasive, will not answer questions directly about whether or not he has a plan.  He is also somnolent, falls asleep during the interview. Level V Caveat due to psychiatric illness.      Past Medical History:  Diagnosis Date  . ADHD (attention deficit hyperactivity disorder)   . Anxiety   . Bipolar 1 disorder (HCC)   . Current smoker   . Depression   . Eating disorder   . Headache(784.0)   . History of ADHD 11/01/2015  . Medical history non-contributory   . Mental disorder   . Nicotine dependence 11/03/2015  . 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 Polysubstance dependence (HCC) 07/17/2017  . Cocaine abuse (HCC)   . 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 Polysubstance abuse (HCC) 03/18/2017  . Intentional drug overdose (HCC) 12/05/2016  . Acute encephalopathy 12/05/2016  . Sinus tachycardia 12/05/2016  . Hypotension 12/05/2016  . Overdose, intentional self-harm, initial encounter (HCC) 11/20/2016  . Intentional SSRI (selective serotonin reuptake inhibitor) overdose (HCC) 11/20/2016  . Substance induced mood disorder (HCC) 09/08/2016  . Homelessness 09/02/2016  . Attention deficit hyperactivity disorder (ADHD) 07/03/2016  . 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 Tobacco use disorder 07/03/2016  . Unspecified depressive  disorder 07/03/2016  . 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 Dextromethorphan overdose 06/03/2016  . Substance-induced psychotic disorder (HCC)   . Leukocytosis   . Cannabis use disorder, moderate, dependence (HCC) 12/04/2012    History reviewed. No pertinent surgical history.     Home Medications    Prior to Admission medications   Medication Sig Start Date End Date Taking? Authorizing Provider  methylphenidate (RITALIN) 10 MG tablet Take 10 mg by mouth 2 (two) times daily. 08/01/17 08/31/17  [provider]  paliperidone (INVEGA SUSTENNA) 234 MG/1.5ML SUSP injection Inject 234 mg into the muscle every 30 (thirty) days.    [provider]    Family History History reviewed. No pertinent family history.  Social History Social History  Substance Use Topics  . Smoking status: Current Some Day Smoker    Packs/day: 0.00    Years: 4.00    Types: Cigarettes  . Smokeless tobacco: Never Used  . Alcohol use No     Allergies   Patient has no known allergies.   Review of Systems Review of Systems  Unable to perform ROS: Psychiatric disorder     Physical Exam Updated Vital Signs BP 135/66 (BP Location: Left Arm)   Pulse 78   Temp 97.6 F (36.4 C) (Oral)   Resp 16   Ht 5\' 6"  (1.676 m)   Wt 72.6 kg (160 lb)   SpO2 99%   BMI 25.82 kg/m   Physical Exam  Constitutional: He is oriented to person, place, and time. He appears well-developed and well-nourished. No distress.  HENT:  Head: Normocephalic and atraumatic.  Right Ear: Hearing normal.  Left Ear: Hearing normal.  Nose: Nose normal.  Mouth/Throat: Oropharynx is clear and moist and mucous membranes are normal.  Eyes: Pupils are equal, round, and reactive to light. Conjunctivae and EOM are normal.  Neck: Normal range of motion. Neck supple.  Cardiovascular: Regular rhythm, S1 normal and S2 normal.  Exam reveals no gallop and no friction rub.   No murmur heard. Pulmonary/Chest: Effort normal and breath  sounds normal. No respiratory distress. He exhibits no tenderness.  Abdominal: Soft. Normal appearance and bowel sounds are normal. There is no hepatosplenomegaly. There is no tenderness. There is no rebound, no guarding, no tenderness at McBurney's point and negative Murphy's sign. No hernia.  Musculoskeletal: Normal range of motion.  Neurological: He is alert and oriented to person, place, and time. He has normal strength. No cranial nerve deficit or sensory deficit. Coordination normal. GCS eye subscore is 4. GCS verbal subscore is 5. GCS motor subscore is 6.  Skin: Skin is warm, dry and intact. No rash noted. No cyanosis.  Psychiatric: His speech is delayed. He is withdrawn. He exhibits a depressed mood. He expresses suicidal ideation.  Nursing note and vitals reviewed.    ED Treatments / Results  Labs (all labs ordered are listed, but only abnormal results are displayed) Labs Reviewed  ACETAMINOPHEN LEVEL - Abnormal; Notable for the following:       Result Value   Acetaminophen (Tylenol), Serum <10 (*)    All other components within normal limits  COMPREHENSIVE METABOLIC PANEL  ETHANOL  SALICYLATE LEVEL  CBC  RAPID URINE DRUG SCREEN, HOSP PERFORMED  CBG MONITORING, ED    EKG  EKG Interpretation None       Radiology No results found.  Procedures Procedures (including critical care time)  Medications Ordered in ED Medications - No data to display   Initial Impression / Assessment and Plan / ED Course  I have reviewed the triage vital signs and the nursing notes.  Pertinent labs & imaging results that were available during my care of the patient were reviewed by me and considered in my medical decision making (see chart for details).     Patient presents to the emergency department for evaluation of suicidal ideation.  Patient has been seen frequently in the ER for similar.  He did apparently have an overdose yesterday, but [facility] and  discharged by psychiatry.  He reports persistent suicidality.  Will have psychiatry evaluation.  Final Clinical Impressions(s) / ED Diagnoses   Final diagnoses:  Suicidal ideation    New Prescriptions New Prescriptions   No medications on file     Gilda CreasePollina, Danish Ruffins J, MD 08/08/17 0006

## 2017-08-07 NOTE — [facility] Suicide Risk Assessment (Signed)
Suicide Risk Assessment  Discharge Assessment   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 Polysubstance dependence (HCC) [F19.20] 07/17/2017  . Cocaine abuse (HCC) [F14.10]   . 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 Polysubstance abuse (HCC) [F19.10] 03/18/2017  . Intentional drug overdose (HCC) [T50.902A] 12/05/2016  . Acute encephalopathy [G93.40] 12/05/2016  . Sinus tachycardia [R00.0] 12/05/2016  . Hypotension [I95.9] 12/05/2016  . Overdose, intentional self-harm, initial encounter (HCC) [T50.902A] 11/20/2016  . Intentional SSRI (selective serotonin reuptake inhibitor) overdose (HCC) [T43.222A] 11/20/2016  . 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 Homelessness [Z59.0] 09/02/2016  . Attention deficit hyperactivity disorder (ADHD) [F90.9] 07/03/2016  . 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 Tobacco use disorder [F17.200] 07/03/2016  . Unspecified depressive  disorder [F32.9] 07/03/2016  . 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 Dextromethorphan overdose [T48.3X1A] 06/03/2016  . 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 Leukocytosis [D72.829]   . Cannabis use disorder, moderate, dependence (HCC) [F12.20] 12/04/2012    Total Time spent with patient: 45 minutes  Musculoskeletal: Strength & Muscle Tone: within normal limits Gait & Station: normal Patient leans: N/A  Psychiatric Specialty Exam: Physical Exam  Constitutional: He is oriented to person, place, and time. He appears well-developed and well-nourished.  HENT:  Head: Normocephalic.  Respiratory: Effort normal.  Musculoskeletal: Normal range of motion.   Neurological: He is alert and oriented to person, place, and time.  Psychiatric: His speech is normal and behavior is normal. Thought content normal. His mood appears anxious. Cognition and memory are impaired. He expresses impulsivity. He exhibits a depressed mood.   Review of Systems  Psychiatric/Behavioral: Positive for depression and substance abuse. Negative for hallucinations, memory loss and suicidal ideas. The patient is nervous/anxious. The patient does not have insomnia.   All other systems reviewed and are negative.  Blood pressure 118/66, pulse (!) 119, temperature 98.2 F (36.8 C), temperature source Oral, resp. rate 18, height 5\' 6"  (1.676 m), weight 72.6 kg (160 lb), SpO2 98 %.Body mass index is 25.82 kg/m. General Appearance: Disheveled Eye Contact:  Good Speech:  Clear and Coherent Volume:  Normal Mood:  Euthymic Affect:  Congruent Thought Process:  Coherent, Goal Directed and Linear Orientation:  Full (Time, Place, and Person) Thought Content:  Logical Suicidal Thoughts:  No Homicidal Thoughts:  No Memory:  Immediate;   Good Recent;   Good Remote;   Fair Judgement:  Fair Insight:  Fair Psychomotor Activity:  Normal Concentration:  Concentration: Good and Attention Span: Fair Recall:  Good Fund of Knowledge:  Good Language:  Good Akathisia:  No Handed:  Right AIMS (if indicated):    Assets:  ArchitectCommunication Skills Financial Resources/Insurance Resilience Social Support ADL's:  Intact Cognition:  WNL   Mental Status Per Nursing Assessment::   On Admission:   Restless, irritable  Demographic Factors:  Male, Adolescent or young adult, Caucasian, Low socioeconomic status and Unemployed  Loss Factors: Legal issues and Financial problems/change in socioeconomic status  Historical Factors: Impulsivity  Risk Reduction Factors:   Sense of responsibility to family, Positive social support and Positive therapeutic relationship  Continued Clinical  Symptoms:  Depression:   Impulsivity Alcohol/Substance Abuse/Dependencies More than one psychiatric diagnosis Previous Psychiatric Diagnoses and Treatments  Cognitive Features That Contribute To  Risk:  Closed-mindedness    Suicide Risk:  Minimal: No identifiable suicidal ideation.  Patients presenting with no risk factors but with morbid ruminations; may be classified as minimal risk based on the severity of the depressive symptoms    Plan Of Care/Follow-up recommendations:  Activity:  as tolerated Diet:  Heart Healthy  Laveda Abbe, NP 08/07/2017, 11:41 AM

## 2017-08-07 NOTE — ED Notes (Signed)
Pt remains on cardiac monitoring.

## 2017-08-07 NOTE — Discharge Instructions (Signed)
For you behavioral health needs, you are advised to continue treatment with the [facility]:       [facility]      [facility] [address]      [address]      [place], [place]  [address]      [phone]      Crisis number: [phone]

## 2017-08-07 NOTE — ED Notes (Signed)
Pt presents with SI after ingesting Cough Syrup and Dextromethorphan earlier this evening.  A&O x 3, no distress noted, calm & cooperative, restless, inappropriate laughing.  Monitoring for safety, Q 15 min checks in effect.  Safety check for contraband completed, no items found.

## 2017-08-07 NOTE — Consult Note (Signed)
[facility] Psychiat[facility] Consult   Reason for Consult:  Suicidal ideation Referring Physician:  EDP Patient Identification: Thomas [name] MRN:  [id number] Principal Diagnosis: Substance-induced psychotic disorder [facility]) Diagnosis:   Patient Active Problem List   Diagnosis Date Noted  . Polysubstance dependence ([place]) [F19.20] 07/17/2017  . Cocaine abuse ([place]) [F14.10]   . 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 Cocaine abuse with cocaine-induced anxiety disorder ([place]) [F14.180] 05/01/2017  . Polysubstance abuse ([place]) [F19.10] 03/18/2017  . Intentional drug overdose ([place]) [T50.902A] 12/05/2016  . Acute encephalopathy [G93.40] 12/05/2016  . Sinus tachycardia [R00.0] 12/05/2016  . Hypotension [I95.9] 12/05/2016  . Overdose, intentional self-harm, initial encounter ([place]) [T50.902A] 11/20/2016  . Intentional SSRI (selective serotonin reuptake inhibitor) overdose ([place]) [T43.222A] 11/20/2016  . Substance induced mood disorder ([place]) [F19.94] 09/08/2016  . Homelessness [Z59.0] 09/02/2016  . Attention deficit hyperactivity disorder (ADHD) [F90.9] 07/03/2016  . 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 Tobacco use disorder [F17.200] 07/03/2016  . Unspecified depressive  disorder [F32.9] 07/03/2016  . Dextromethorphan use disorder, severe, dependence ([place]) [F19.20] 07/03/2016  . Dextromethorphan overdose [T48.3X1A] 06/03/2016  . Substance-induced psychotic disorder ([place]) [F19.959]   . Leukocytosis [D72.829]   . Cannabis use disorder, moderate, dependence ([place]) [F12.20] 12/04/2012    Total Time spent with patient: 45 minutes  Subjective:   Thomas [name] is a 21 y.o. male patient admitted with paranoia and suicidal ideation.  HPI: Pt was seen and chart reviewed with treatment team, Dr Darleene Cleaver, and Dr Mariea Clonts. Pt presented to the [facility] with complaints of auditory hallucination and suicidal ideation. Pt is a frequent  visitor to the emergency room for abusing OTC cough and cold preparations.  Pt has an ACT team with [facility] and stated he is compliant with his monthly Invega injections. Pt is homeless. Pt's UDS and BAL were negative. Pt stated he is not suicidal today and is ready to be discharged so he can work on getting his life together. Pt denies auditory and visual hallucinations and homicidal ideations. Pt does not appear to be responding to internal stimuli. Pt is stable and psychiatrically clear for discharge.   Past Psychiatric History: As above  Risk to Self: None Risk to Others: None Prior Inpatient Therapy: Prior Inpatient Therapy: Yes Prior Therapy Dates: 2018 Prior Therapy Facilty/Provider(s): Per chart, [facility], [facility], [facility], [facility] Reason for Treatment: Per chart, MH issues.  Prior Outpatient Therapy: Prior Outpatient Therapy: Yes Prior Therapy Dates: Current Prior Therapy Facilty/Provider(s): [facility] Reason for Treatment: Medication mangement and counseling.  Does patient have an ACCT team?: Yes Does patient have Intensive In-House Services?  : No Does patient have [facility] services? : No Does patient have P4CC services?: No  Past Medical History:  Past Medical History:  Diagnosis Date  . ADHD (attention deficit hyperactivity disorder)   . Anxiety   . Bipolar 1 disorder ([place])   . Current smoker   . Depression   . Eating disorder   . Headache(784.0)   . History of ADHD 11/01/2015  . Medical history non-contributory   . Mental disorder   . Nicotine dependence 11/03/2015  . Psychoactive substance-induced mood disorder ([place]) 10/28/2015   History reviewed. No pertinent surgical history. Family History: No family history on file. Family Psychiatric  History: Unknown Social History:  History  Alcohol Use No     History  Drug Use  . Types: Marijuana, Cocaine, LSD, MDMA (Ecstacy)    Comment: Pt reports using Molly as well and  reports using these drugs     Social History   Social History  .  Marital status: Single    Spouse name: N/A  . Number of children: N/A  . Years of education: N/A   Social History Main Topics  . Smoking status: Current Some Day Smoker    Packs/day: 0.00    Years: 4.00    Types: Cigarettes  . Smokeless tobacco: Never Used  . Alcohol use No  . Drug use: Yes    Types: Marijuana, Cocaine, LSD, MDMA (Ecstacy)     Comment: Pt reports using Molly as well and reports using these drugs   . Sexual activity: Yes    Birth control/ protection: None   Other Topics Concern  . None   Social History Narrative  . None   Additional Social History:    Allergies:  No Known Allergies  Labs:  Results for orders placed or performed during the hospital encounter of 08/06/17 (from the past 48 hour(s))  Rapid urine drug screen (hospital performed)     Status: None   Collection Time: 08/06/17  9:05 PM  Result Value Ref Range   Opiates NONE DETECTED NONE DETECTED   Cocaine NONE DETECTED NONE DETECTED   Benzodiazepines NONE DETECTED NONE DETECTED   Amphetamines NONE DETECTED NONE DETECTED   Tetrahydrocannabinol NONE DETECTED NONE DETECTED   Barbiturates NONE DETECTED NONE DETECTED    Comment:        DRUG SCREEN FOR MEDICAL PURPOSES ONLY.  IF CONFIRMATION IS NEEDED FOR ANY PURPOSE, NOTIFY LAB WITHIN 5 DAYS.        LOWEST DETECTABLE LIMITS FOR URINE DRUG SCREEN Drug Class       Cutoff (ng/mL) Amphetamine      1000 Barbiturate      200 Benzodiazepine   374 Tricyclics       827 Opiates          300 Cocaine          300 THC              50   Comprehensive metabolic panel     Status: Abnormal   Collection Time: 08/06/17  9:20 PM  Result Value Ref Range   Sodium 141 135 - 145 mmol/L   Potassium 3.3 (L) 3.5 - 5.1 mmol/L   Chloride 101 101 - 111 mmol/L   CO2 28 22 - 32 mmol/L   Glucose, Bld 116 (H) 65 - 99 mg/dL   BUN 8 6 - 20 mg/dL   Creatinine, Ser 0.63 0.61 - 1.24 mg/dL   Calcium 9.8 8.9 - 10.3 mg/dL   Total Protein 7.4 6.5 - 8.1 g/dL   Albumin 4.4  3.5 - 5.0 g/dL   AST 16 15 - 41 U/L   ALT 24 17 - 63 U/L   Alkaline Phosphatase 71 38 - 126 U/L   Total Bilirubin 0.6 0.3 - 1.2 mg/dL   GFR calc non Af Amer >60 >60 mL/min   GFR calc Af Amer >60 >60 mL/min    Comment: (NOTE) The eGFR has been calculated using the CKD EPI equation. This calculation has not been validated in all clinical situations. eGFR's persistently <60 mL/min signify possible Chronic Kidney Disease.    Anion gap 12 5 - 15  Ethanol     Status: None   Collection Time: 08/06/17  9:20 PM  Result Value Ref Range   Alcohol, Ethyl (B) <10 <10 mg/dL    Comment:  LOWEST DETECTABLE LIMIT FOR SERUM ALCOHOL IS 10 mg/dL FOR MEDICAL PURPOSES ONLY   Salicylate level     Status: None   Collection Time: 08/06/17  9:20 PM  Result Value Ref Range   Salicylate Lvl <7.3 2.8 - 30.0 mg/dL  Acetaminophen level     Status: Abnormal   Collection Time: 08/06/17  9:20 PM  Result Value Ref Range   Acetaminophen (Tylenol), Serum <10 (L) 10 - 30 ug/mL    Comment:        THERAPEUTIC CONCENTRATIONS VARY SIGNIFICANTLY. A RANGE OF 10-30 ug/mL MAY BE AN EFFECTIVE CONCENTRATION FOR MANY PATIENTS. HOWEVER, SOME ARE BEST TREATED AT CONCENTRATIONS OUTSIDE THIS RANGE. ACETAMINOPHEN CONCENTRATIONS >150 ug/mL AT 4 HOURS AFTER INGESTION AND >50 ug/mL AT 12 HOURS AFTER INGESTION ARE OFTEN ASSOCIATED WITH TOXIC REACTIONS.   cbc     Status: Abnormal   Collection Time: 08/06/17  9:20 PM  Result Value Ref Range   WBC 9.4 4.0 - 10.5 K/uL   RBC 4.59 4.22 - 5.81 MIL/uL   Hemoglobin 14.6 13.0 - 17.0 g/dL   HCT 40.3 39.0 - 52.0 %   MCV 87.8 78.0 - 100.0 fL   MCH 31.8 26.0 - 34.0 pg   MCHC 36.2 (H) 30.0 - 36.0 g/dL   RDW 12.4 11.5 - 15.5 %   Platelets 190 150 - 400 K/uL  Magnesium     Status: None   Collection Time: 08/06/17 10:15 PM  Result Value Ref Range   Magnesium 2.0 1.7 - 2.4 mg/dL    Current Facility-Administered Medications  Medication Dose Route Frequency Provider Last  Rate Last Dose  . alum & mag hydroxide-simeth (MAALOX/MYLANTA) 200-200-20 MG/5ML suspension 30 mL  30 mL Oral Q6H PRN Pollina, Gwenyth Allegra, MD      . ibuprofen (ADVIL,MOTRIN) tablet 600 mg  600 mg Oral Q8H PRN Pollina, Gwenyth Allegra, MD      . risperiDONE (RISPERDAL M-TABS) disintegrating tablet 2 mg  2 mg Oral Q8H PRN Pollina, Gwenyth Allegra, MD       And  . ziprasidone (GEODON) injection 20 mg  20 mg Intramuscular PRN Pollina, Gwenyth Allegra, MD       Current Outpatient Prescriptions  Medication Sig Dispense Refill  . methylphenidate (RITALIN) 10 MG tablet Take 10 mg by mouth 2 (two) times daily.    . paliperidone (INVEGA SUSTENNA) 234 MG/1.5ML SUSP injection Inject 234 mg into the muscle every 30 (thirty) days.      Musculoskeletal: Strength & Muscle Tone: within normal limits Gait & Station: normal Patient leans: N/A  Psychiatric Specialty Exam: Physical Exam  Constitutional: He is oriented to person, place, and time. He appears well-developed and well-nourished.  HENT:  Head: Normocephalic.  Respiratory: Effort normal.  Musculoskeletal: Normal range of motion.  Neurological: He is alert and oriented to person, place, and time.  Psychiatric: His speech is normal and behavior is normal. Thought content normal. His mood appears anxious. Cognition and memory are impaired. He expresses impulsivity. He exhibits a depressed mood.    Review of Systems  Psychiatric/Behavioral: Positive for depression and substance abuse. Negative for hallucinations, memory loss and suicidal ideas. The patient is nervous/anxious. The patient does not have insomnia.   All other systems reviewed and are negative.   Blood pressure 118/66, pulse (!) 119, temperature 98.2 F (36.8 C), temperature source Oral, resp. rate 18, height '5\' 6"'  (1.676 m), weight 72.6 kg (160 lb), SpO2 98 %.Body mass index is 25.82 kg/m.  General Appearance: Disheveled  Eye Contact:  Good  Speech:  Clear and Coherent  Volume:   Normal  Mood:  Euthymic  Affect:  Congruent  Thought Process:  Coherent, Goal Directed and Linear  Orientation:  Full (Time, Place, and Person)  Thought Content:  Logical  Suicidal Thoughts:  No  Homicidal Thoughts:  No  Memory:  Immediate;   Good Recent;   Good Remote;   Fair  Judgement:  Fair  Insight:  Fair  Psychomotor Activity:  Normal  Concentration:  Concentration: Good and Attention Span: Fair  Recall:  Good  Fund of Knowledge:  Good  Language:  Good  Akathisia:  No  Handed:  Right  AIMS (if indicated):     Assets:  Agricultural consultant Resilience Social Support  ADL's:  Intact  Cognition:  WNL  Sleep:        Treatment Plan Summary: Plan Substance induced psychotic disorder  Discharge home Follow up with [facility] Act Team Take all medications as prescribed Avoid the use of OTC medications such as ; cough and cold preperations, alcohol and illicit drugs.   Disposition: No evidence of imminent risk to self or others at present.   Patient does not meet criteria for psychiatric inpatient admission. Supportive therapy provided about ongoing stressors. Discussed crisis plan, support from social network, calling 911, coming to the Emergency Department, and calling Suicide Hotline.  Ethelene Hal, NP 08/07/2017 11:26 AM  Patient seen face-to-face for psychiatric evaluation, chart reviewed and case discussed with the physician extender and developed treatment plan. Reviewed the information documented and agree with the treatment plan. Corena Pilgrim, MD

## 2017-08-07 NOTE — ED Notes (Signed)
Received call from Poison Control; d/t normal labs & VS's are closing the case.

## 2017-08-07 NOTE — [facility] Counselor (Signed)
Per Donell SievertSpencer Simon, PA-C: Recommendation for continual observation for safety and stabilization due to malingering reports.  Re-evaluation in the AM, by psychiatry, for likely discharge.    [facility] Attending Provider, Blinda LeatherwoodPollina, MD and Joanie CoddingtonLatricia, RN notified at [phone].

## 2017-08-08 MED ORDER — IBUPROFEN 400 MG PO TABS: 600.0000 mg | ORAL_TABLET | Freq: Three times a day (TID) | ORAL | Status: DC | PRN

## 2017-08-08 MED ORDER — LORAZEPAM 1 MG PO TABS: 1.0000 mg | ORAL_TABLET | ORAL | Status: DC | PRN

## 2017-08-08 MED ORDER — RISPERIDONE 2 MG PO TBDP
2.0000 mg | ORAL_TABLET | Freq: Three times a day (TID) | ORAL | Status: DC | PRN
Start: 2017-08-08 — End: 2017-08-08
  Filled 2017-08-08: qty 1

## 2017-08-08 MED ORDER — NICOTINE 21 MG/24HR TD PT24: 21.0000 mg | MEDICATED_PATCH | Freq: Every day | TRANSDERMAL | Status: DC

## 2017-08-08 MED ORDER — ONDANSETRON HCL 4 MG PO TABS: 4.0000 mg | ORAL_TABLET | Freq: Three times a day (TID) | ORAL | Status: DC | PRN

## 2017-08-08 MED ORDER — ZIPRASIDONE MESYLATE 20 MG IM SOLR: 20.0000 mg | INTRAMUSCULAR | Status: DC | PRN

## 2017-08-08 MED ORDER — ALUM & MAG HYDROXIDE-SIMETH 200-200-20 MG/5ML PO SUSP: 30.0000 mL | Freq: Four times a day (QID) | ORAL | Status: DC | PRN

## 2017-08-08 NOTE — BH Assessment (Addendum)
Tele Assessment Note   Patient Name: Thomas [name] MRN: [id number] Referring Physician: DR. Blinda LeatherwoodPOLLINA Location of Patient: [facility] Location of Provider: Behavioral Health TTS Department  Wilber Oliphantaleb Vallery RidgeDillon Hynes is an 21 y.o. male who [facility] after being discharge earlier today. Pt has a hx of frequent ED visits despite he has [facility] services currently. Per pt, he is current with monthly Invega IMs from [facility] and sts he is complaint. Pt sts he is having SI, HI but no AVH at this time. Pt sts he plans to "jump from a train to kill myself." Pt sts he wants "to kill people who make me mad." Pt could name no one in particular. Pt denied AVH. Pt stated "I have no place to go" and "I feel safe here." Pt sts he is and has been homeless for many months. Per pt hx, pt has no family support.   Per pt hx, pt has a payee, [employer], who manages his disability income. Pt does not appear to have a legal guardian. Pt has had 17 ED visits since 02/13/17 and 35+ ED visits since 09/17/16. Pt has had 14 TTS Assessments since 01/22/17 with the last assessment before this assessment 08/07/17 at 11:18 AM by Dr. Jannifer FranklinAkintayo and the last TTS assessment on 08/07/17 at 5:34 AM. Pt was discharged. Comments in pt record that he is possibly Malingering due to being homeless. Pt stated on 08/07/17 that he had just gotten out of jail on 08/06/17 and sts he has another court date on 08/20/17 [place]. Pt denies any SA except smoking Cannabis "occasionally." Pt sts he smokes 1 pack of cigarettes daily. Pt's BAL and UDS were negative tonight for all substances. Pt has been had Psychiatric admission may times including [facility], [facility], [facility] and [facility]. Previous diagnoses include Bipolar 1 D/O, GAD, ADHD, and Eating D/O and Polysubstance Abuse.   Pt was dressed in scrubs and lying on his hospital bed. Pt was sleepy, irritable and somewhat uncooperative.  Pt continually fell asleep during the assessment  as we were talking.  Pt kept poor eye contact as his eyes were mostly closed. Pt spoke in a clear tone and at a normal pace. Pt moved in a normal manner when moving. Pt's thought process was coherent and relevant and judgement was somewhat impaired.  No indication of delusional thinking or response to internal stimuli. Pt's mood was stated as depressed but not anxious and his flat affect was congruent.  Pt was oriented x 4, to person, place, time and situation.   Diagnosis: Bipolar 1 D/O by hx; GAD by hx; ADHD by hx; Polysubstance Abuse by hx  Past Medical History:  Past Medical History:  Diagnosis Date  . ADHD (attention deficit hyperactivity disorder)   . Anxiety   . Bipolar 1 disorder (HCC)   . Current smoker   . Depression   . Eating disorder   . Headache(784.0)   . History of ADHD 11/01/2015  . Medical history non-contributory   . Mental disorder   . Nicotine dependence 11/03/2015  . 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 No pertinent surgical history.  Family History: History reviewed. No pertinent family history.  Social History:  reports that he has been smoking Cigarettes.  He has been smoking about 0.00 packs per day for the past 4.00 years. He has never used smokeless tobacco. He reports that he uses drugs, including Marijuana, Cocaine, LSD, and MDMA (Ecstacy). He  reports that he does not drink alcohol.  Additional Social History:     CIWA: CIWA-Ar BP: 135/66 Pulse Rate: 78 COWS:    PATIENT STRENGTHS: (choose at least two) Average or above average intelligence Communication skills  Allergies: No Known Allergies  Home Medications:  (Not in a hospital admission)  OB/GYN Status:  No LMP for male patient.  General Assessment Data Location of Assessment: [facility] ED TTS Assessment: In system Is this a Tele or Face-to-Face Assessment?: Tele Assessment Is this an Initial Assessment or a Re-assessment for this encounter?: Initial  Assessment Marital status: Single Is patient pregnant?: No Pregnancy Status: No Living Arrangements: Other (Comment) (HOMELESS) Can pt return to current living arrangement?: Yes Admission Status: Voluntary Is patient capable of signing voluntary admission?: Yes Referral Source: Self/Family/Friend Insurance type: MEDICAID / [place] ACCESS     Crisis Care Plan Living Arrangements: Other (Comment) (HOMELESS) Name of Psychiatrist: [facility] Name of Therapist: [facility]  Education Status Is patient currently in school?: No Highest grade of school patient has completed: 10  Risk to self with the past 6 months Suicidal Ideation: Yes-Currently Present Has patient been a risk to self within the past 6 months prior to admission? : Yes Suicidal Intent: Yes-Currently Present Has patient had any suicidal intent within the past 6 months prior to admission? : Yes Is patient at risk for suicide?: Yes Suicidal Plan?: Yes-Currently Present Has patient had any suicidal plan within the past 6 months prior to admission? : Yes Specify Current Suicidal Plan: STS "WANTS TO JUMP OFF A TRAIN" Access to Means: No What has been your use of drugs/alcohol within the last 12 months?: DAILY USE Previous Attempts/Gestures: Yes How many times?:  (MULTIPLE PER PT) Other Self Harm Risks:  (DENIES) Triggers for Past Attempts: Unknown Intentional Self Injurious Behavior: None Comment - Self Injurious Behavior: DENIES Family Suicide History: Yes (PER HX) Recent stressful life event(s): Other (Comment) (HOMELESSNESS; SA) Persecutory voices/beliefs?: No Depression: Yes Depression Symptoms: Isolating, Fatigue, Loss of interest in usual pleasures, Feeling worthless/self pity, Feeling angry/irritable Substance abuse history and/or treatment for substance abuse?: Yes Suicide prevention information given to non-admitted patients: Not applicable  Risk to Others within the past 6 months Homicidal Ideation:  Yes-Currently Present Does patient have any lifetime risk of violence toward others beyond the six months prior to admission? : No Thoughts of Harm to Others: Yes-Currently Present Comment - Thoughts of Harm to Others: STS WANTS TO HURT OTHERS WHO MAKE HIM ANGRY Current Homicidal Intent: Yes-Currently Present Current Homicidal Plan: No Access to Homicidal Means: No (STS NO ACCESS TO GUNS) Identified Victim: NONE History of harm to others?: No Assessment of Violence: None Noted Does patient have access to weapons?: No Criminal Charges Pending?: Yes Describe Pending Criminal Charges: STS [facility] FOR LARCENY Does patient have a court date: Yes Court Date: 08/20/17 Is patient on probation?: Unknown  Psychosis Hallucinations: Auditory, With command (STS VOICES TELL HIM TO KILL HIMSELF) Delusions: None noted  Mental Status Report Appearance/Hygiene: Disheveled, In scrubs Eye Contact: Poor Motor Activity: Freedom of movement Speech: Logical/coherent Level of Consciousness: Drowsy Mood: Depressed, Irritable Affect: Flat, Depressed, Irritable Anxiety Level: Minimal Thought Processes: Coherent, Relevant Judgement: Partial Orientation: Person, Place, Time, Situation Obsessive Compulsive Thoughts/Behaviors: None  Cognitive Functioning Concentration: Poor Memory: Recent Intact, Remote Intact IQ: Average Insight: Poor Impulse Control: Poor Appetite: Fair Weight Loss:  (REPORTS SOME LOSS) Weight Gain: 0 Sleep: Decreased Total Hours of Sleep:  (CANNOT QUANTIFY BUT STS 0 SLEEP FOR DAYS) Vegetative  Symptoms: None  ADLScreening [facility] Assessment Services) Patient's cognitive ability adequate to safely complete daily activities?: Yes Patient able to express need for assistance with ADLs?: Yes Independently performs ADLs?: Yes (appropriate for developmental age)  Prior Inpatient Therapy Prior Inpatient Therapy: Yes Prior Therapy Dates: 2018 Prior Therapy Facilty/Provider(s): PER HX,  [facility], [facility], [facility], [facility], [facility] Reason for Treatment: BIPOLAR 1  Prior Outpatient Therapy Prior Outpatient Therapy: Yes Prior Therapy Dates: CURRENT Prior Therapy Facilty/Provider(s): [facility] Reason for Treatment: MED MGMT & COUNSELING Does patient have an ACCT team?: Yes ([facility]) Does patient have Intensive In-House Services?  : No Does patient have [facility] services? : No Does patient have P4CC services?: No  ADL Screening (condition at time of admission) Patient's cognitive ability adequate to safely complete daily activities?: Yes Patient able to express need for assistance with ADLs?: Yes Independently performs ADLs?: Yes (appropriate for developmental age)       Abuse/Neglect Assessment (Assessment to be complete while patient is alone) Physical Abuse: Denies Verbal Abuse: Denies Sexual Abuse: Denies Exploitation of patient/patient's resources: Denies Self-Neglect: Denies     Merchant navy officer (For Healthcare) Does Patient Have a Medical Advance Directive?: No Would patient like information on creating a medical advance directive?: No - Patient declined    Additional Information 1:1 In Past 12 Months?: Yes CIRT Risk: No Elopement Risk: No Does patient have medical clearance?: Yes     Disposition:  Disposition Initial Assessment Completed for this Encounter: Yes Disposition of Patient: Other dispositions Other disposition(s): Other (Comment) (PENDING REVIEW W [facility] EXTENDER)  This service was provided via telemedicine using a 2-way, interactive audio and video technology.  Names of all persons participating in this telemedicine service and their role in this encounter. Name: Beryle Flock Role: Triage Specialist, [facility], CRC  Name: Franklyn Lor Role: Patient  Name:  Role:   Name:  Role:    Per Donell Sievert, PA recommend observation overnight for safety & stability.  Spoke with Dr. Blinda Leatherwood, EDP at [facility] and advised of recommendation. He stated he agrees.    Beryle Flock, MS, CRC, [facility] Triage Specialist [facility] T 08/08/2017 3:47 AM

## 2017-08-08 NOTE — ED Notes (Signed)
Patient did not sign inventory sheets but did receive back ALL belongings.

## 2017-08-08 NOTE — ED Provider Notes (Signed)
Patient requesting to leave, will not stay in the department.  He says he has an appointment with his therapist at 2 PM today.  He denies any current SI, HI, hallucinations.  He does not feel that he is a danger to himself at this time and states he will come back if he has any concerning thoughts or feelings.  Patient does not meet commitment criteria at this time.  Will discharge with psychiatry follow-up.   Tilden Fossaees, Madyn Ivins, MD 08/08/17 [phone]

## 2017-08-08 NOTE — ED Notes (Signed)
Given bus pass. 

## 2017-08-08 NOTE — ED Notes (Addendum)
Pt states, "I don't have anywhere to stay." Pt reports coming to ED "because had gone too long without sleep."  Pt expresses interest in group home. Dr. Madilyn Hookees made aware.

## 2017-08-09 ENCOUNTER — Encounter (HOSPITAL_COMMUNITY): Payer: Self-pay | Admitting: Emergency Medicine

## 2017-08-09 ENCOUNTER — Emergency Department (HOSPITAL_COMMUNITY)
Admission: EM | Admit: 2017-08-09 | Discharge: 2017-08-10 | Disposition: A | Discharge status: Home / Self Care | Payer: Medicaid Other | Attending: Emergency Medicine | Admitting: Emergency Medicine

## 2017-08-09 DIAGNOSIS — F329 Major depressive disorder, single episode, unspecified: Secondary | ICD-10-CM | POA: Diagnosis present

## 2017-08-09 DIAGNOSIS — F14180 Cocaine abuse with cocaine-induced anxiety disorder: Secondary | ICD-10-CM | POA: Diagnosis not present

## 2017-08-09 DIAGNOSIS — F1994 Other psychoactive substance use, unspecified with psychoactive substance-induced mood disorder: Secondary | ICD-10-CM | POA: Insufficient documentation

## 2017-08-09 DIAGNOSIS — F17210 Nicotine dependence, cigarettes, uncomplicated: Secondary | ICD-10-CM | POA: Insufficient documentation

## 2017-08-09 DIAGNOSIS — F1910 Other psychoactive substance abuse, uncomplicated: Secondary | ICD-10-CM | POA: Insufficient documentation

## 2017-08-09 DIAGNOSIS — T483X2A Poisoning by antitussives, intentional self-harm, initial encounter: Secondary | ICD-10-CM

## 2017-08-09 DIAGNOSIS — R45851 Suicidal ideations: Secondary | ICD-10-CM

## 2017-08-09 DIAGNOSIS — F909 Attention-deficit hyperactivity disorder, unspecified type: Secondary | ICD-10-CM | POA: Insufficient documentation

## 2017-08-09 LAB — RAPID URINE DRUG SCREEN, HOSP PERFORMED
Amphetamines: NOT DETECTED
BARBITURATES: NOT DETECTED
BENZODIAZEPINES: NOT DETECTED
COCAINE: POSITIVE — AB
Opiates: NOT DETECTED
Tetrahydrocannabinol: NOT DETECTED

## 2017-08-09 LAB — COMPREHENSIVE METABOLIC PANEL
ALT: 19 U/L (ref 17–63)
ANION GAP: 15 (ref 5–15)
AST: 16 U/L (ref 15–41)
Albumin: 4.9 g/dL (ref 3.5–5.0)
Alkaline Phosphatase: 77 U/L (ref 38–126)
BILIRUBIN TOTAL: 1.2 mg/dL (ref 0.3–1.2)
BUN: 11 mg/dL (ref 6–20)
CO2: 23 mmol/L (ref 22–32)
Calcium: 9.7 mg/dL (ref 8.9–10.3)
Chloride: 103 mmol/L (ref 101–111)
Creatinine, Ser: 0.78 mg/dL (ref 0.61–1.24)
GFR calc Af Amer: >60 mL/min (ref >60)
Glucose, Bld: 61 mg/dL — ABNORMAL LOW (ref 65–99)
POTASSIUM: 4 mmol/L (ref 3.5–5.1)
Sodium: 141 mmol/L (ref 135–145)
TOTAL PROTEIN: 8.4 g/dL — AB (ref 6.5–8.1)

## 2017-08-09 LAB — SALICYLATE LEVEL: Salicylate Lvl: <7 mg/dL (ref 2.8–30.0)

## 2017-08-09 LAB — CBC
HEMATOCRIT: 44.8 % (ref 39.0–52.0)
Hemoglobin: 16.2 g/dL (ref 13.0–17.0)
MCH: 32.1 pg (ref 26.0–34.0)
MCHC: 36.2 g/dL — ABNORMAL HIGH (ref 30.0–36.0)
MCV: 88.9 fL (ref 78.0–100.0)
PLATELETS: 216 10*3/uL (ref 150–400)
RBC: 5.04 MIL/uL (ref 4.22–5.81)
RDW: 12.7 % (ref 11.5–15.5)
WBC: 9.4 10*3/uL (ref 4.0–10.5)

## 2017-08-09 LAB — ACETAMINOPHEN LEVEL: Acetaminophen (Tylenol), Serum: <10 ug/mL — ABNORMAL LOW (ref 10–30)

## 2017-08-09 LAB — ETHANOL

## 2017-08-09 NOTE — ED Triage Notes (Signed)
Pt reports that he took a dose of DXM in order to harm self tonight. Pt staring off and having to reorient pt during time of triage. Pt uncertain of time of taking DXM.

## 2017-08-09 NOTE — ED Notes (Signed)
[facility] Expected date:  Expected time:  Means of arrival:  Comments: [name]

## 2017-08-09 NOTE — ED Provider Notes (Addendum)
TIME SEEN: 11:50 PM  CHIEF COMPLAINT: Overdose, suicidal thoughts  HPI: Patient is a 21 year old male with history of bipolar disorder, ADHD who is well-known to our emergency department who presents to the emergency department today with complaints of overdose of dextromethorphan.  He is unable to tell me when he took this medication or how much.  He states that he took it to get "high".  Also states that he is having suicidal thoughts and took the medication in an attempt to hurt himself.  No HI or hallucinations.  Denies any other coingestions.  Denies drug or alcohol abuse.  No medical complaints.  States that he did it because he got mad at his mother today.  ROS: See HPI Constitutional: no fever  Eyes: no drainage  ENT: no runny nose   Cardiovascular:  no chest pain  Resp: no SOB  GI: no vomiting GU: no dysuria Integumentary: no rash  Allergy: no hives  Musculoskeletal: no leg swelling  Neurological: no slurred speech ROS otherwise negative  PAST MEDICAL HISTORY/PAST SURGICAL HISTORY:  Past Medical History:  Diagnosis Date  . ADHD (attention deficit hyperactivity disorder)   . Anxiety   . Bipolar 1 disorder (HCC)   . Current smoker   . Depression   . Eating disorder   . Headache(784.0)   . History of ADHD 11/01/2015  . Medical history non-contributory   . Mental disorder   . Nicotine dependence 11/03/2015  . 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 Authorizing Provider  methylphenidate (RITALIN) 10 MG tablet Take 10 mg by mouth 2 (two) times daily. 08/01/17 08/31/17  [provider]  paliperidone (INVEGA SUSTENNA) 234 MG/1.5ML SUSP injection Inject 234 mg into the muscle every 30 (thirty) days.    [provider]    ALLERGIES:  No Known Allergies  SOCIAL HISTORY:  Social History  Substance Use Topics  . Smoking status: Current Some  Day Smoker    Packs/day: 0.00    Years: 4.00    Types: Cigarettes  . Smokeless tobacco: Never Used  . Alcohol use No    FAMILY HISTORY: History reviewed. No pertinent family history.  EXAM: BP (!) 131/91 (BP Location: Left Arm)   Pulse (!) 130   Temp 98.7 F (37.1 C) (Oral)   Resp 12   Ht 5\' 6"  (1.676 m)   Wt 72.6 kg (160 lb)   SpO2 97%   BMI 25.82 kg/m  CONSTITUTIONAL: Alert and oriented and responds appropriately to questions. Well-appearing; well-nourished HEAD: Normocephalic EYES: Conjunctivae clear, pupils appear equal, EOMI ENT: normal nose; moist mucous membranes NECK: Supple, no meningismus, no nuchal rigidity, no LAD  CARD: Regular and tachycardic; S1 and S2 appreciated; no murmurs, no clicks, no rubs, no gallops RESP: Normal chest excursion without splinting or tachypnea; breath sounds clear and equal bilaterally; no wheezes, no rhonchi, no rales, no hypoxia or respiratory distress, speaking full sentences ABD/GI: Normal bowel sounds; non-distended; soft, non-tender, no rebound, no guarding, no peritoneal signs, no hepatosplenomegaly BACK:  The back appears normal and is non-tender to palpation, there is no CVA tenderness EXT: Normal ROM in all joints; non-tender to palpation; no edema; normal capillary refill; no cyanosis, no calf tenderness or swelling    SKIN: Normal color for age and race; warm; no rash NEURO: Moves all extremities equally, no clonus, patient does have rotary nystagmus that is fatigable on exam, clear speech, no  facial asymmetry, has a difficult time following commands and answering questions appropriately PSYCH: Patient appears intoxicated.  Frequently staring off into space.  Has a difficult time staying focused and answer questions.  Endorses suicidality.  No HI or hallucinations.  MEDICAL DECISION MAKING: Patient here with intentional overdose.  He does initially state that he took extra dextromethorphan to "get high".  He then also states that  it was an attempt to harm himself.  He is here frequently with suicidality.  He appears intoxicated currently.  Will discuss with poison control.  Screening labs and urine unremarkable other than patient being cocaine positive.  He is tachycardic.  ED PROGRESS: Discussed with poison control.  Patient will need to be monitored until he no longer appears intoxicated.  Unclear how long this could be.  Could be over 12 hours after ingestion if he consumed something like Delsym which is longer acting.  Patient will be monitored until the morning.  If still intoxicated at that time, will admit to medicine.  Will reassess for SI, HI when patient is no longer intoxicated.  2:20 AM  Pt now appears more coherent.  Nystagmus has resolved.  Tachycardia improving.  Still slightly tachycardic but I suspect this is from his history of Ritalin use and cocaine abuse.  We will hold his home Ritalin at this time.  Still complaining of suicidal thoughts without ability to contract for safety.  Will consult TTS.   3:10 AM  D/w Caprice Renshawreylese with [facility].  There would like to reevaluate patient in the morning.  I feel this is appropriate.    I reviewed all nursing notes, vitals, pertinent previous records, EKGs, lab and urine results, imaging (as available).   EKG Interpretation  Date/Time:  Thursday August 09 2017 23:34:06 EDT Ventricular Rate:  115 PR Interval:    QRS Duration: 89 QT Interval:  324 QTC Calculation: 449 R Axis:   -50 Text Interpretation:  Sinus tachycardia Probable left atrial enlargement Left axis deviation Confirmed by Rochele RaringWard, Yides Saidi [phone]([id number]) on 08/09/2017 11:50:47 PM          Shiron Whetsel, Layla MawKristen N, DO 08/10/17 0314    Odester Nilson, Layla MawKristen N, DO 08/10/17 [phone]

## 2017-08-10 LAB — CBG MONITORING, ED: GLUCOSE-CAPILLARY: 75 mg/dL (ref 65–99)

## 2017-08-10 MED ORDER — ONDANSETRON HCL 4 MG PO TABS: 4.0000 mg | ORAL_TABLET | Freq: Three times a day (TID) | ORAL | Status: DC | PRN

## 2017-08-10 MED ORDER — ACETAMINOPHEN 325 MG PO TABS: 650.0000 mg | ORAL_TABLET | ORAL | Status: DC | PRN

## 2017-08-10 MED ORDER — ALUM & MAG HYDROXIDE-SIMETH 200-200-20 MG/5ML PO SUSP: 30.0000 mL | Freq: Four times a day (QID) | ORAL | Status: DC | PRN

## 2017-08-10 MED ORDER — ZOLPIDEM TARTRATE 5 MG PO TABS: 5.0000 mg | ORAL_TABLET | Freq: Every evening | ORAL | Status: DC | PRN

## 2017-08-10 NOTE — ED Notes (Signed)
ED Provider at bedside. 

## 2017-08-10 NOTE — BH Assessment (Addendum)
Assessment Note  Thomas [name] is an 21 y.o. male, who presents voluntary and unaccompanied to [facility]. Pt was recently assessed at [facility] and [facility] on 07/17/2017, 08/03/2017 (twice), 08/07/2017, 08/08/2017, for similar presentations. Clinician asked the pt, "what brought you to the hospital?" Pt reported, taking a lot of DXM to get high. Pt reported, experiencing delusions, hallucinations, seeing evil everywhere after taking DXM. Pt reported, he only having delusions and SI at night because he gets scared. Clinician asked the pt if he was suicidal. Pt reported, he prefers not to answer. Pt reported, "my brain is spinning when will it be still." Clinician observed the pt put is hands on his head. Pt denies, HI, self-injurious behaviors and access to weapons.   Pt denies abuse. Pt's UDS is pending. Pt reported, not using substances while taking his prescribed medications. Pt reported, being linked to [facility] for medication management and counseling. Per chart, pt has previous inpatient admissions.   Pt presents, restless, disheveled, sweating in scrubs with slurred speech. Pt's eye contact was poor. Pt would stare out, wide eyed during the assessment. Pt's mood was anxious. Pt's affect was anxious/flat. Pt's thought process as circumstantial. Pt's judgement was impaired. Pt's concentration, insight and impulse control are poor. Pt was oriented x3 (year, city and state.) Pt reported, if discharged from [facility] he could contract for safety. Pt reported, if inpatient treatment is recommended he would sign-in voluntarily.   Diagnosis: Substance-Induce Psychotic Disorder                     Bipolar 1 Disorder [facility])   Past Medical History:  Past Medical History:  Diagnosis Date  . ADHD (attention deficit hyperactivity disorder)   . Anxiety   . Bipolar 1 disorder (HCC)   . Current smoker   . Depression   . Eating disorder   . Headache(784.0)   . History of ADHD 11/01/2015  . Medical history  non-contributory   . Mental disorder   . Nicotine dependence 11/03/2015  . 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 No pertinent surgical history.  Family History: History reviewed. No pertinent family history.  Social History:  reports that he has been smoking Cigarettes.  He has been smoking about 0.00 packs per day for the past 4.00 years. He has never used smokeless tobacco. He reports that he uses drugs, including Marijuana, Cocaine, LSD, and MDMA (Ecstacy). He reports that he does not drink alcohol.  Additional Social History:  Alcohol / Drug Use Pain Medications: See MAR Prescriptions: See MAR Over the Counter: See MAR History of alcohol / drug use?: Yes Substance #1 Name of Substance 1: DXM 1 - Age of First Use: UTA 1 - Amount (size/oz): UTA 1 - Frequency: UTA 1 - Duration: Ongoing 1 - Last Use / Amount: Pt reported, eariler today.   CIWA: CIWA-Ar BP: 118/74 Pulse Rate: (!) 118 COWS:    Allergies: No Known Allergies  Home Medications:  (Not in a hospital admission)  OB/GYN Status:  No LMP for male patient.  General Assessment Data [facility] of [facility]: [facility] Assessment: In system Is this a Tele or Face-to-Face Assessment?: Face-to-Face Is this an Initial Assessment or a Re-assessment for this encounter?: Initial Assessment Marital status: Single Is patient pregnant?: No Pregnancy Status: No Living Arrangements: Other (Comment) (Homeless) Can pt return to current living arrangement?: Yes Admission Status: Voluntary Is patient capable of signing voluntary admission?: Yes Referral Source: Other Insurance type:  Medicaid     Crisis Care Plan Living Arrangements: Other (Comment) (Homeless) Legal Guardian: Other: (Self) Name of Psychiatrist: [facility] Name of Therapist: [facility]  Education Status Is patient currently in school?: No Current Grade: NA Highest grade of school patient has completed: Per chart,  10th grade. Name of school: NA Contact person: NA  Risk to self with the past 6 months Suicidal Ideation:  (Pt reported, he prefers not to answer. ) Has patient been a risk to self within the past 6 months prior to admission? : Yes Suicidal Intent: No-Not Currently/Within Last 6 Months (Per chart. ) Has patient had any suicidal intent within the past 6 months prior to admission? : Yes Is patient at risk for suicide?:  (UTA) Suicidal Plan?:  (Pt reported, he prefers not to answer. ) Has patient had any suicidal plan within the past 6 months prior to admission? : Yes Specify Current Suicidal Plan: Per pt's recent visit, pt reported, wanting to jump in front of a train.  Access to Means:  (UTA) What has been your use of drugs/alcohol within the last 12 months?: DXM Previous Attempts/Gestures: No (Pt denies. ) Other Self Harm Risks: Pt denies.  Triggers for Past Attempts: Unknown Intentional Self Injurious Behavior: None (Pt denies. ) Comment - Self Injurious Behavior: Pt denies.  Family Suicide History: Yes (Per chart.) Recent stressful life event(s): Other (Comment) (Homelessness, Substance use. ) Persecutory voices/beliefs?: No Depression: No (Pt denies. ) Depression Symptoms:  (Pt denies. ) Substance abuse history and/or treatment for substance abuse?: Yes Suicide prevention information given to non-admitted patients: Not applicable  Risk to Others within the past 6 months Homicidal Ideation: No-Not Currently/Within Last 6 Months (Pt denies. Per chart pt was recently homicidal. ) Does patient have any lifetime risk of violence toward others beyond the six months prior to admission? : No Thoughts of Harm to Others: No-Not Currently Present/Within Last 6 Months (Pt denies. Per chart pt was recently homicidal. ) Comment - Thoughts of Harm to Others: Per chart, pt wants to hurt anyone who makes him angry.   Current Homicidal Intent: No-Not Currently/Within Last 6 Months (Pt denies. Per  chart pt was recently homicidal. ) Current Homicidal Plan: No (Pt denies. ) Access to Homicidal Means: No (Pt denies. ) Identified Victim: NA History of harm to others?: No Assessment of Violence: None Noted Violent Behavior Description: NA Does patient have access to weapons?: No (Pt denies. ) Criminal Charges Pending?: Yes (Per chart. ) Describe Pending Criminal Charges: Per chart, pt has pending larceny charge.  Does patient have a court date: Yes (Per chart. ) Court Date: 08/20/17 (Per chart. ) Is patient on probation?: Unknown  Psychosis Hallucinations: Auditory, Visual Delusions: Unspecified  Mental Status Report Appearance/Hygiene: Disheveled, In scrubs (sweating) Eye Contact: Poor Motor Activity: Freedom of movement Speech: Slurred Level of Consciousness: Restless Mood: Anxious Affect: Anxious, Flat Anxiety Level: Moderate Thought Processes: Circumstantial Judgement: Impaired Orientation: Other (Comment) (year, city and state.) Obsessive Compulsive Thoughts/Behaviors: None  Cognitive Functioning Concentration: Poor Memory: Recent Intact IQ: Average Insight: Poor Impulse Control: Poor Appetite: Fair Sleep: Decreased Total Hours of Sleep: 2 Vegetative Symptoms: None  ADLScreening [facility] Assessment Services) Patient's cognitive ability adequate to safely complete daily activities?: Yes Patient able to express need for assistance with ADLs?: Yes Independently performs ADLs?: Yes (appropriate for developmental age)  Prior Inpatient Therapy Prior Inpatient Therapy: Yes Prior Therapy Dates: 2018 Prior Therapy Facilty/Provider(s): Per chart, [facility], [facility], [facility], [facility], [facility] Reason for Treatment: Per  chart, Bipolar 1.   Prior Outpatient Therapy Prior Outpatient Therapy: Yes Prior Therapy Dates: Current Prior Therapy Facilty/Provider(s): [facility] Reason for Treatment: Medication management and counseling. Does patient have an ACCT team?: Yes Does patient have  Intensive In-House Services?  : No Does patient have [facility] services? : No Does patient have P4CC services?: No  ADL Screening (condition at time of admission) Patient's cognitive ability adequate to safely complete daily activities?: Yes Is the patient deaf or have difficulty hearing?: No Does the patient have difficulty seeing, even when wearing glasses/contacts?: No Does the patient have difficulty concentrating, remembering, or making decisions?: Yes Patient able to express need for assistance with ADLs?: Yes Does the patient have difficulty dressing or bathing?: No Independently performs ADLs?: Yes (appropriate for developmental age) Does the patient have difficulty walking or climbing stairs?: Yes (Pt is wobbly on his feet. ) Weakness of Legs: None Weakness of Arms/Hands: None  Home Assistive Devices/Equipment Home Assistive Devices/Equipment: None    Abuse/Neglect Assessment (Assessment to be complete while patient is alone) Physical Abuse: Denies (Pt denies. ) Verbal Abuse: Denies (Pt denies.) Sexual Abuse: Denies (Pt denies. ) Exploitation of patient/patient's resources: Denies (Pt denies. ) Self-Neglect: Denies (Pt denies. )     Merchant navy officerAdvance Directives (For Healthcare) Does Patient Have a Medical Advance Directive?: No    Additional Information 1:1 In Past 12 Months?: Yes CIRT Risk: No Elopement Risk: No Does patient have medical clearance?: No     Disposition: Hillery Jacksanika Lewis, NP recommends observation for safety and stabilization. Disposition discussed with Dr. Elesa MassedWard and Lowella BandyNikki, RN.    Disposition Initial Assessment Completed for this Encounter: Yes Disposition of Patient: Re-evaluation by Psychiatry recommended (recommends observation for safety and stabilization.) Other disposition(s): Other (Comment) (recommends observation for safety and stabilization.)  On Site Evaluation by:   Reviewed with Physician:  Dr. Elesa MassedWard and Hillery Jacksanika Lewis, NP.  Redmond Pullingreylese D  Malaia Buchta 08/10/2017 3:30 AM   Redmond Pullingreylese D Baylie Drakes, MS, [facility], [facility] Triage Specialist [phone]

## 2017-08-10 NOTE — BHH Suicide Risk Assessment (Signed)
Suicide Risk Assessment  Discharge Assessment   [facility]   Principal Problem: Substance induced mood disorder [facility](HCC) Discharge Diagnoses:  Patient Active Problem List   Diagnosis Date Noted  . Cocaine abuse with cocaine-induced anxiety disorder [facility](HCC) [F14.180] 05/01/2017    Priority: High  . Polysubstance abuse (HCC) [F19.10] 03/18/2017    Priority: High  . Substance induced mood disorder (HCC) [F19.94] 09/08/2016    Priority: High  . Polysubstance dependence (HCC) [F19.20] 07/17/2017  . Cocaine abuse (HCC) [F14.10]   . 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 Intentional drug overdose (HCC) [T50.902A] 12/05/2016  . Acute encephalopathy [G93.40] 12/05/2016  . Sinus tachycardia [R00.0] 12/05/2016  . Hypotension [I95.9] 12/05/2016  . Overdose, intentional self-harm, initial encounter (HCC) [T50.902A] 11/20/2016  . Intentional SSRI (selective serotonin reuptake inhibitor) overdose (HCC) [T43.222A] 11/20/2016  . Homelessness [Z59.0] 09/02/2016  . Attention deficit hyperactivity disorder (ADHD) [F90.9] 07/03/2016  . 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 Tobacco use disorder [F17.200] 07/03/2016  . Unspecified depressive  disorder [F32.9] 07/03/2016  . 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 Dextromethorphan overdose [T48.3X1A] 06/03/2016  . 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 Leukocytosis [D72.829]   . Cannabis use disorder, moderate, dependence (HCC) [F12.20] 12/04/2012    Total Time spent with patient: 45 minutes  Musculoskeletal: Strength & Muscle Tone: within normal limits Gait & Station: normal Patient leans: N/A  Psychiatric Specialty Exam:   Blood pressure 139/88, pulse (!) 102, temperature 98.1 F (36.7 C), temperature source Oral, resp. rate 16, height 5\' 6"  (1.676 m), weight 72.6 kg (160 lb), SpO2 98 %.Body mass index  is 25.82 kg/m.  General Appearance: Casual  Eye Contact::  Good  Speech:  Normal Rate409  Volume:  Normal  Mood:  Euthymic  Affect:  Congruent  Thought Process:  Coherent and Descriptions of Associations: Intact  Orientation:  Full (Time, Place, and Person)  Thought Content:  WDL and Logical  Suicidal Thoughts:  No  Homicidal Thoughts:  No  Memory:  Immediate;   Fair Recent;   Fair Remote;   Fair  Judgement:  Fair  Insight:  Fair  Psychomotor Activity:  Normal  Concentration:  Good  Recall:  FiservFair  Fund of Knowledge:Fair  Language: Good  Akathisia:  No  Handed:  Right  AIMS (if indicated):     Assets:  Leisure Time Physical Health Resilience Social Support  Sleep:     Cognition: WNL  ADL's:  Intact   Mental Status Per Nursing Assessment::   On Admission:   21 yo male who presented to the ED after using drugs and having hallucinations.  He slept and was much better this morning with no hallucinations or delusions.  [facility] was notified and they will come and get him.  No suicidal/homicidal ideations, hallucinations, or withdrawal symptoms.  Stable for discharge.  Demographic Factors:  Male and Caucasian  Loss Factors: NA  Historical Factors: NA  Risk Reduction Factors:   Sense of responsibility to family, Living with another person, especially a relative, Positive social support and Positive therapeutic relationship  Continued Clinical Symptoms:  Substance abuse  Cognitive Features That Contribute To Risk:  None    Suicide Risk:  Minimal: No identifiable suicidal ideation.  Patients presenting with no risk factors but with morbid ruminations; may be classified as minimal risk based on the severity of the depressive symptoms    Plan Of Care/Follow-up recommendations:  Activity:  as tolerated Diet:  heart healthy diet  LORD, JAMISON, NP 08/10/2017, 9:38 AM

## 2017-08-10 NOTE — ED Notes (Signed)
Patient admitted to the unit. Appear drowsy with slurred speech. Patient stated "My brain is popping off". Patient gazes at this writer and wouldn't answer any question. In bed at this time. Will continue to monitor patient.

## 2017-08-10 NOTE — ED Provider Notes (Signed)
Complains of headache. Tachycardia noted on two previous vital signs - will recheck vital signs. Will give acetaminophen for headache.   Dione BoozeGlick, Lucrezia Dehne, MD 08/10/17 [phone]

## 2017-08-10 NOTE — ED Notes (Signed)
Sitting up on stretcher  No acute distress noted  Sitter at bedside

## 2017-08-10 NOTE — ED Notes (Signed)
Pt given macaroni and cheese and a coke

## 2017-08-10 NOTE — Discharge Instructions (Signed)
For your behavioral health needs, you are advised to continue treatment with the [facility]: ° °     [facility] °     [facility], [address] °     [address] °     [place], [place]  [address] °     [phone] °     Crisis number: [phone] °

## 2017-08-10 NOTE — BH Assessment (Signed)
BHH Assessment Progress Note  Per Mojeed Akintayo, MD, this pt does not require psychiatric hospitalization at this time.  Pt is to be discharged from [facility] with recommendation to continue treatment with the [facility].  This has been included in pt's discharge instructions.  Pt's nurse, [name], has been notified.  Anjanae Woehrle, MA Triage Specialist [phone]     

## 2017-08-10 NOTE — ED Notes (Addendum)
Pt d/c home per MD order. Discharge summary reviewed with pt. Pt denies SI/HI/AVH. Pt signed for personal property and property returned. Pt signed e-signature. Ambulatory off unit.

## 2017-08-12 ENCOUNTER — Emergency Department (HOSPITAL_COMMUNITY)
Admission: EM | Admit: 2017-08-12 | Discharge: 2017-08-12 | Discharge status: Against Medical Advice | Payer: Medicaid Other

## 2017-09-24 ENCOUNTER — Encounter (HOSPITAL_COMMUNITY): Payer: Self-pay

## 2017-09-24 ENCOUNTER — Emergency Department (HOSPITAL_COMMUNITY)
Admission: EM | Admit: 2017-09-24 | Discharge: 2017-09-25 | Disposition: A | Discharge status: Home / Self Care | Payer: Medicaid Other | Attending: Emergency Medicine | Admitting: Emergency Medicine

## 2017-09-24 ENCOUNTER — Other Ambulatory Visit: Payer: Self-pay

## 2017-09-24 DIAGNOSIS — R45851 Suicidal ideations: Secondary | ICD-10-CM | POA: Insufficient documentation

## 2017-09-24 DIAGNOSIS — Z5900 Homelessness unspecified: Secondary | ICD-10-CM

## 2017-09-24 DIAGNOSIS — F17210 Nicotine dependence, cigarettes, uncomplicated: Secondary | ICD-10-CM | POA: Insufficient documentation

## 2017-09-24 DIAGNOSIS — F1914 Other psychoactive substance abuse with psychoactive substance-induced mood disorder: Secondary | ICD-10-CM | POA: Diagnosis not present

## 2017-09-24 DIAGNOSIS — R4587 Impulsiveness: Secondary | ICD-10-CM | POA: Diagnosis not present

## 2017-09-24 DIAGNOSIS — F19959 Other psychoactive substance use, unspecified with psychoactive substance-induced psychotic disorder, unspecified: Secondary | ICD-10-CM | POA: Diagnosis not present

## 2017-09-24 DIAGNOSIS — Z79899 Other long term (current) drug therapy: Secondary | ICD-10-CM | POA: Insufficient documentation

## 2017-09-24 DIAGNOSIS — R000 Tachycardia, unspecified: Secondary | ICD-10-CM | POA: Diagnosis not present

## 2017-09-24 DIAGNOSIS — R440 Auditory hallucinations: Secondary | ICD-10-CM | POA: Diagnosis present

## 2017-09-24 DIAGNOSIS — F1210 Cannabis abuse, uncomplicated: Secondary | ICD-10-CM | POA: Diagnosis not present

## 2017-09-24 LAB — COMPREHENSIVE METABOLIC PANEL
ALT: 22 U/L (ref 17–63)
AST: 27 U/L (ref 15–41)
Albumin: 4.8 g/dL (ref 3.5–5.0)
Alkaline Phosphatase: 63 U/L (ref 38–126)
Anion gap: 9 (ref 5–15)
BUN: 18 mg/dL (ref 6–20)
CHLORIDE: 101 mmol/L (ref 101–111)
CO2: 28 mmol/L (ref 22–32)
CREATININE: 0.92 mg/dL (ref 0.61–1.24)
Calcium: 9.5 mg/dL (ref 8.9–10.3)
GFR calc non Af Amer: >60 mL/min (ref >60)
Glucose, Bld: 85 mg/dL (ref 65–99)
Potassium: 3.9 mmol/L (ref 3.5–5.1)
SODIUM: 138 mmol/L (ref 135–145)
Total Bilirubin: 1.3 mg/dL — ABNORMAL HIGH (ref 0.3–1.2)
Total Protein: 8.1 g/dL (ref 6.5–8.1)

## 2017-09-24 LAB — CBC
HCT: 44.8 % (ref 39.0–52.0)
HEMOGLOBIN: 15.8 g/dL (ref 13.0–17.0)
MCH: 31.7 pg (ref 26.0–34.0)
MCHC: 35.3 g/dL (ref 30.0–36.0)
MCV: 90 fL (ref 78.0–100.0)
Platelets: 231 10*3/uL (ref 150–400)
RBC: 4.98 MIL/uL (ref 4.22–5.81)
RDW: 12.8 % (ref 11.5–15.5)
WBC: 13.1 10*3/uL — AB (ref 4.0–10.5)

## 2017-09-24 LAB — ACETAMINOPHEN LEVEL

## 2017-09-24 LAB — RAPID URINE DRUG SCREEN, HOSP PERFORMED
AMPHETAMINES: NOT DETECTED
BENZODIAZEPINES: NOT DETECTED
Barbiturates: NOT DETECTED
COCAINE: POSITIVE — AB
OPIATES: NOT DETECTED
TETRAHYDROCANNABINOL: NOT DETECTED

## 2017-09-24 LAB — SALICYLATE LEVEL

## 2017-09-24 LAB — ETHANOL: Alcohol, Ethyl (B): <10 mg/dL (ref <10)

## 2017-09-24 MED ORDER — LORAZEPAM 1 MG PO TABS: 1.0000 mg | ORAL_TABLET | Freq: Three times a day (TID) | ORAL | Status: DC | PRN

## 2017-09-24 MED ORDER — SODIUM CHLORIDE 0.9 % IV BOLUS (SEPSIS)
1000.0000 mL | Freq: Once | INTRAVENOUS | Status: AC
  Administered 2017-09-24: 1000 mL via INTRAVENOUS

## 2017-09-24 NOTE — ED Notes (Signed)
Bed: [facility] Expected date:  Expected time:  Means of arrival:  Comments: 

## 2017-09-24 NOTE — ED Notes (Signed)
Bed: [facility] Expected date:  Expected time:  Means of arrival:  Comments: 

## 2017-09-24 NOTE — ED Provider Notes (Signed)
[facility]-EMERGENCY DEPT Provider Note   CSN: [id number] Arrival date & time: 09/24/17  0818     History   Chief Complaint Chief Complaint  Patient presents with  . Headache    Patient states he used cough syrup to get high  . Hallucinations  . Suicidal    HPI Thomas [name] is a 21 y.o. male.  HPI Patient presents to the emergency room for evaluation of auditory and visual hallucinations.  Patient has a history of mental health problems.  Patient states he drank many bottles of cough syrup.  He states he is having auditory and visual hallucinations about evil stuff.  Patient cannot tell me if the hallucinations started before or after drinking cough syrup.  He states he was drank the cough syrup to get high and not to harm himself.  Denies any other drug or alcohol use.  He did mention to the nurse suicidal thoughts without a plan.  He did not mention this to me Past Medical History:  Diagnosis Date  . ADHD (attention deficit hyperactivity disorder)   . Anxiety   . Bipolar 1 disorder (HCC)   . Current smoker   . Depression   . Eating disorder   . Headache(784.0)   . History of ADHD 11/01/2015  . Medical history non-contributory   . Mental disorder   . Nicotine dependence 11/03/2015  . 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 Polysubstance dependence (HCC) 07/17/2017  . Cocaine abuse (HCC)   . 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 Polysubstance abuse (HCC) 03/18/2017  . Intentional drug overdose (HCC) 12/05/2016  . Acute encephalopathy 12/05/2016  . Sinus tachycardia 12/05/2016  . Hypotension 12/05/2016  . Overdose, intentional self-harm, initial encounter (HCC) 11/20/2016  . Intentional SSRI (selective serotonin reuptake inhibitor)  overdose (HCC) 11/20/2016  . Substance induced mood disorder (HCC) 09/08/2016  . Homelessness 09/02/2016  . Attention deficit hyperactivity disorder (ADHD) 07/03/2016  . 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 Tobacco use disorder 07/03/2016  . Unspecified depressive  disorder 07/03/2016  . 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 Dextromethorphan overdose 06/03/2016  . Substance-induced psychotic disorder (HCC)   . Leukocytosis   . Cannabis use disorder, moderate, dependence (HCC) 12/04/2012    History reviewed. No pertinent surgical history.     Home Medications    Prior to Admission medications   Medication Sig Start Date End Date Taking? Authorizing Provider  Dextromethorphan-Guaifenesin 10-100 MG/5ML liquid Take by mouth once.    [provider]  methylphenidate (RITALIN) 10 MG tablet Take 10 mg by mouth 2 (two) times daily. 08/01/17 08/31/17  [provider]  paliperidone (INVEGA SUSTENNA) 234 MG/1.5ML SUSP injection Inject 234 mg into the muscle every 30 (thirty) days.    [provider]    Family History History reviewed. No pertinent family history.  Social History Social History   Tobacco Use  . Smoking status: Current Some Day Smoker    Packs/day: 0.00    Years: 4.00    Pack years: 0.00    Types: Cigarettes  . Smokeless tobacco: Never Used  Substance Use Topics  . Alcohol use: No  . Drug use: Yes    Types: Marijuana, Cocaine, LSD, MDMA (Ecstacy), Methamphetamines    Comment: Pt reports using Molly as well and reports using these  drugs      Allergies   Patient has no known allergies.   Review of Systems Review of Systems  All other systems reviewed and are negative.    Physical Exam Updated Vital Signs BP 101/66 (BP Location: Left Arm)   Pulse 81   Temp 98.2 F (36.8 C) (Oral)   Resp 16   Ht 1.676 m (5\' 6" )   Wt 72.6 kg (160 lb)   SpO2 100%   BMI 25.82 kg/m   Physical Exam    Constitutional: No distress.  HENT:  Head: Normocephalic and atraumatic.  Right Ear: External ear normal.  Left Ear: External ear normal.  Eyes: Conjunctivae are normal. Right eye exhibits no discharge. Left eye exhibits no discharge. No scleral icterus.  Neck: Neck supple. No tracheal deviation present.  Cardiovascular: Regular rhythm and intact distal pulses. Tachycardia present.  Pulmonary/Chest: Effort normal and breath sounds normal. No stridor. No respiratory distress. He has no wheezes. He has no rales.  Abdominal: Soft. Bowel sounds are normal. He exhibits no distension. There is no tenderness. There is no rebound and no guarding.  Musculoskeletal: He exhibits no edema or tenderness.  Neurological: He is alert. He has normal strength. No cranial nerve deficit (no facial droop, extraocular movements intact, no slurred speech) or sensory deficit. He exhibits normal muscle tone. He displays no seizure activity. Coordination normal.  Skin: Skin is warm and dry. No rash noted.  Psychiatric: He has a normal mood and affect. His speech is delayed. He is slowed. He expresses suicidal ideation.  Nursing note and vitals reviewed.    ED Treatments / Results  Labs (all labs ordered are listed, but only abnormal results are displayed) Labs Reviewed  COMPREHENSIVE METABOLIC PANEL - Abnormal; Notable for the following components:      Result Value   Total Bilirubin 1.3 (*)    All other components within normal limits  ACETAMINOPHEN LEVEL - Abnormal; Notable for the following components:   Acetaminophen (Tylenol), Serum <10 (*)    All other components within normal limits  CBC - Abnormal; Notable for the following components:   WBC 13.1 (*)    All other components within normal limits  RAPID URINE DRUG SCREEN, HOSP PERFORMED - Abnormal; Notable for the following components:   Cocaine POSITIVE (*)    All other components within normal limits  ETHANOL  SALICYLATE LEVEL    EKG  EKG  Interpretation  Date/Time:  Monday September 24 2017 11:03:39 EST Ventricular Rate:  93 PR Interval:  126 QRS Duration: 96 QT Interval:  382 QTC Calculation: 474 R Axis:   79 Text Interpretation:  Normal sinus rhythm with sinus arrhythmia Normal ECG No significant change since last tracing except rate slower Confirmed by Linwood DibblesKnapp, Kimbely Whiteaker [phone]([id number]) on 09/24/2017 11:22:22 AM       Radiology No results found.  Procedures Procedures (including critical care time)  Medications Ordered in ED Medications  sodium chloride 0.9 % bolus 1,000 mL (0 mLs Intravenous Stopped 09/24/17 1048)     Initial Impression / Assessment and Plan / ED Course  I have reviewed the triage vital signs and the nursing notes.  Pertinent labs & imaging results that were available during my care of the patient were reviewed by me and considered in my medical decision making (see chart for details).   Pt presented to the ED for passive SI and hallucinations after intentional use of cough medication to get high.  No signs of serious toxic effects.  Vitals  have improved.  Pt has been monitored in the ED.  Medically stable, cleared.  Psych recommends continued observation.    Final Clinical Impressions(s) / ED Diagnoses   Final diagnoses:  Oth psychoactive substance abuse w mood disorder [facility])  Homelessness      Linwood Dibbles, MD 09/24/17 1418

## 2017-09-24 NOTE — ED Notes (Signed)
Bed: [id number] Expected date:  Expected time:  Means of arrival:  Comments: Temporary use for [facility]Hall D

## 2017-09-24 NOTE — ED Notes (Signed)
Bed: WHALD Expected date:  Expected time:  Means of arrival:  Comments: 

## 2017-09-24 NOTE — ED Triage Notes (Signed)
Per EMS- Patient reported to EMs that he was getting high on cough syrup and now is having a headache and hallucinations.

## 2017-09-24 NOTE — ED Triage Notes (Signed)
paatient reports that he is having auditory and visual hallucinations that are about "evil stuff". Patient states he drank many bottles of cough syrup. Patient denies any other drugs or alcohol. Patient states he has suicidal thoughts and denies a plan. Patient denies HI.

## 2017-09-24 NOTE — ED Notes (Addendum)
Pt asleep during vitals, hard to wake. Respirations even and unlabored. Pt appears resting/sleeping comfortably. RN notified.

## 2017-09-24 NOTE — ED Notes (Signed)
Pt came onto the unit and went to bed.

## 2017-09-24 NOTE — ED Triage Notes (Signed)
I have just spoken with Macario Goldsoshanna, RN at [facility]; who recommends the following: 1 baseline EKG 2 supportive care & observation for 6-8 hours from time-of-arrival 3 watch for seizures and give benzodiazepines as needed. They will check back for additional pending lab results. Pt. Is comfortably sleeping.

## 2017-09-24 NOTE — BH Assessment (Signed)
[facility] Assessment Progress Note  Case was staffed with Parks FNP who recommended patient be monitored for safety and  observation. Patient will be seen by psychiatry in the a.m.      

## 2017-09-24 NOTE — ED Notes (Signed)
Patient appeared in no acute distress [place] [facility] [facility] even though I never had the opportunity to introduce myself.

## 2017-09-24 NOTE — ED Notes (Signed)
Patient seen in his room eating chips from his meal teary. Patient appear drowsy and disoriented when assessing the patient and continues to nod YES on every questions. Patient found sleeping and snoring on return with his drinks. Will continue to monitor patient.

## 2017-09-24 NOTE — BH Assessment (Addendum)
Assessment Note  Thomas OliphantCaleb Vallery [name] is an 21 y.o. male that presents this date actively impaired reporting that he has been consuming multiple bottles of cough syrup this date. Patient reports some [facility] stating he is seeing "evil stuff" and is "afraid of himself." Patient denies any plan or intent this date and states he is "really tripping." Patient is time/place oriented and denies any H/I. Patient renders limited history on presentation due to being impaired and speaks in a slow/soft voice. Patient has a extensive history of admissions and [facility]. Patient reports this date per notes, he is having visual hallucinations that are about "evil stuff". Patient states he drank many bottles of cough syrup. Patient denies any other drugs or alcohol. Patient states he has suicidal thoughts and denies a plan. Patient is currently receiving services through [facility] that assists with medication management although patient cannot recall when he last saw that provider. Patient states he is currently being prescribed monthly Invega stating "he thought he got it a couple weeks ago."  Patient states he is here today just to "sleep it off." Patient stated "I have no place to go" and "I feel safe here." Patient reports he has been homeless for over two months now. Per patient history, patient has no family support. Case was staffed with Arville CareParks FNP who recommended patient be monitored for safety and observation. Patient will be seen by psychiatry in the a.m.     Diagnosis: F31.30 Bipolar 1 (per notes)    Past Medical History:  Past Medical History:  Diagnosis Date  . ADHD (attention deficit hyperactivity disorder)   . Anxiety   . Bipolar 1 disorder (HCC)   . Current smoker   . Depression   . Eating disorder   . Headache(784.0)   . History of ADHD 11/01/2015  . Medical history non-contributory   . Mental disorder   . Nicotine dependence 11/03/2015  . 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 No pertinent surgical history.  Family History: History reviewed. No pertinent family history.  Social History:  reports that he has been smoking cigarettes.  He has been smoking about 0.00 packs per day for the past 4.00 years. he has never used smokeless tobacco. He reports that he uses drugs. Drugs: Marijuana, Cocaine, LSD, MDMA (Ecstacy), and Methamphetamines. He reports that he does not drink alcohol.  Additional Social History:  Alcohol / Drug Use Pain Medications: See MAR Prescriptions: See MAR Over the Counter: See MAR History of alcohol / drug use?: Yes Longest period of sobriety (when/how long): Unknown Negative Consequences of Use: Legal, Personal relationships Withdrawal Symptoms: (Denies this date) Substance #1 Name of Substance 1:  Robitussin 1 - Age of First Use: Unknown 1 - Amount (size/oz): Amounts vary 1 - Frequency: Pt states "as much as I can" 1 - Duration: Pt states "alot of years" 1 - Last Use / Amount: 09/24/17 pt reports one bottle  CIWA: CIWA-Ar BP: 101/66 Pulse Rate: 81 COWS:    Allergies: No Known Allergies  Home Medications:  (Not in a hospital admission)  OB/GYN Status:  No LMP for male patient.  General Assessment Data Location of Assessment: [facility] TTS Assessment: In system Is this a Tele or Face-to-Face Assessment?: Face-to-Face Is this an Initial Assessment or a Re-assessment for this encounter?: Initial Assessment Marital status: Single Maiden name: NA Is patient pregnant?: No Pregnancy Status: No Living Arrangements: Alone Can pt  return to current living arrangement?: Yes Admission Status: Voluntary Is patient capable of signing voluntary admission?: Yes Referral Source: Self/Family/Friend Insurance type: Medicaid  Medical Screening Exam [facility] Walk-in ONLY) Medical Exam completed: Yes  Crisis Care Plan Living Arrangements: Alone Legal Guardian: Other: Name  of Psychiatrist: [facility] Name of Therapist: [facility]  Education Status Is patient currently in school?: No Current Grade: (NA) Highest grade of school patient has completed: (10th) Name of school: (NA) Contact person: (NA)  Risk to self with the past 6 months Suicidal Ideation: Yes-Currently Present Has patient been a risk to self within the past 6 months prior to admission? : No Suicidal Intent: No Has patient had any suicidal intent within the past 6 months prior to admission? : No Is patient at risk for suicide?: Yes Suicidal Plan?: No Has patient had any suicidal plan within the past 6 months prior to admission? : No Access to Means: No What has been your use of drugs/alcohol within the last 12 months?: Current use Previous Attempts/Gestures: Yes How many times?: (Multiple per note review) Other Self Harm Risks: (NA) Triggers for Past Attempts: Unknown Intentional Self Injurious Behavior: None Family Suicide History: No Recent stressful life event(s): Other (Comment)(Excessive SA use) Persecutory voices/beliefs?: No Depression: No Depression Symptoms: (NA) Substance abuse history and/or treatment for substance abuse?: Yes Suicide prevention information given to non-admitted patients: Not applicable  Risk to Others within the past 6 months Homicidal Ideation: No Does patient have any lifetime risk of violence toward others beyond the six months prior to admission? : No Thoughts of Harm to Others: No Current Homicidal Intent: No Current Homicidal Plan: No Access to Homicidal Means: No Identified Victim: (NA) History of harm to others?: No Assessment of Violence: None Noted Violent Behavior Description: (NA) Does patient have access to weapons?: No Criminal Charges Pending?: No Does patient have a court date: No Is patient on probation?: No  Psychosis Hallucinations: Visual Delusions: None noted  Mental Status Report Appearance/Hygiene: Unremarkable Eye  Contact: Poor Motor Activity: Freedom of movement Speech: Soft, Slow Level of Consciousness: Drowsy Mood: Pleasant Affect: Appropriate to circumstance Anxiety Level: Minimal Thought Processes: Coherent, Relevant Judgement: Partial Orientation: Person, Place, Time Obsessive Compulsive Thoughts/Behaviors: None  Cognitive Functioning Concentration: Decreased Memory: Recent Intact, Remote Intact IQ: Average Insight: Poor Impulse Control: Poor Appetite: Fair Weight Loss: 0 Weight Gain: 0 Sleep: No Change Total Hours of Sleep: 6 Vegetative Symptoms: None  ADLScreening [facility] Assessment Services) Patient's cognitive ability adequate to safely complete daily activities?: Yes Patient able to express need for assistance with ADLs?: Yes Independently performs ADLs?: Yes (appropriate for developmental age)  Prior Inpatient Therapy Prior Inpatient Therapy: Yes Prior Therapy Dates: 2018 Prior Therapy Facilty[facility]Provider(s): [facility], [facility] Reason for Treatment: MH SA issues  Prior Outpatient Therapy Prior Outpatient Therapy: Yes Prior Therapy Dates: 2018 Prior Therapy Facilty/Provider(s): [facility] Reason for Treatment: Med mang Does patient have an ACCT team?: Yes Does patient have Intensive In-House Services?  : No Does patient have [facility] services? : No Does patient have P[facility] services?: No  ADL Screening (condition at time of admission) Patient's cognitive ability adequate to safely complete daily activities?: Yes Is the patient deaf or have difficulty hearing?: No Does the patient have difficulty seeing, even when wearing glasses/contacts?: No Does the patient have difficulty concentrating, remembering, or making decisions?: No Patient able to express need for assistance with ADLs?: Yes Does the patient have difficulty dressing or bathing?: No Independently performs ADLs?: Yes (appropriate for developmental  age) Does the patient have difficulty walking or climbing stairs?:  No Weakness of Legs: None Weakness of Arms/Hands: None  Home Assistive Devices/Equipment Home Assistive Devices/Equipment: None  Therapy Consults (therapy consults require a physician order) PT Evaluation Needed: No OT Evalulation Needed: No SLP Evaluation Needed: No Abuse/Neglect Assessment (Assessment to be complete while patient is alone) Physical Abuse: Denies Verbal Abuse: Denies Sexual Abuse: Denies Exploitation of patient/patient's resources: Denies Self-Neglect: Denies Values / Beliefs Cultural Requests During Hospitalization: None Spiritual Requests During Hospitalization: None Consults Spiritual Care Consult Needed: No Social Work Consult Needed: No Merchant navy officer (For Healthcare) Does Patient Have a Medical Advance Directive?: No Would patient like information on creating a medical advance directive?: No - Patient declined    Additional Information 1:1 In Past 12 Months?: No CIRT Risk: No Elopement Risk: No Does patient have medical clearance?: No     Disposition: Case was staffed with Arville Care FNP who recommended patient be monitored for safety and observation. Patient will be seen by psychiatry in the a.m.     Disposition Initial Assessment Completed for this Encounter: Yes Disposition of Patient: Other dispositions Other disposition(s): Other (Comment)(Monitored for safety and observation discharged later today)  On Site Evaluation by:   Reviewed with Physician:    Alfredia Ferguson 09/24/2017 1:45 PM

## 2017-09-24 NOTE — ED Notes (Signed)
Bed: WTR5 Expected date:  Expected time:  Means of arrival:  Comments: 

## 2017-09-24 NOTE — ED Notes (Signed)
His clothing was damp, and we changed him into purple scrubs. He is drowsy and in no distress.

## 2017-09-24 NOTE — ED Notes (Signed)
[id number] Expected date:  Expected time:  Means of arrival:  Comments[facility]

## 2017-09-25 DIAGNOSIS — F19959 Other psychoactive substance use, unspecified with psychoactive substance-induced psychotic disorder, unspecified: Secondary | ICD-10-CM | POA: Diagnosis not present

## 2017-09-25 DIAGNOSIS — F17210 Nicotine dependence, cigarettes, uncomplicated: Secondary | ICD-10-CM | POA: Diagnosis not present

## 2017-09-25 DIAGNOSIS — F1210 Cannabis abuse, uncomplicated: Secondary | ICD-10-CM

## 2017-09-25 DIAGNOSIS — R4587 Impulsiveness: Secondary | ICD-10-CM

## 2017-09-25 DIAGNOSIS — F1410 Cocaine abuse, uncomplicated: Secondary | ICD-10-CM | POA: Diagnosis not present

## 2017-09-25 NOTE — BH Assessment (Signed)
[facility]  Per Laveda AbbeLaurie Britton Parks, FNP, this pt does not require psychiatric hospitalization at this time.  [facility] with recommendation to continue treatment with [facility].  This has been included in pt's discharge instructions.  Pt's nurse, Kendal Hymendie, has been notified.  Doylene Canninghomas Ericca Labra, MA Triage Specialist [phone]

## 2017-09-25 NOTE — Consult Note (Signed)
[place] Psychiatry Consult   Reason for Consult:  Impaired  Referring Physician:  EDP Patient Identification: Thomas [name] MRN:  [id number] Principal Diagnosis: Substance-induced psychotic disorder [facility]) Diagnosis:   Patient Active Problem List   Diagnosis Date Noted  . Polysubstance dependence ([place]) [F19.20] 07/17/2017  . Cocaine abuse ([place]) [F14.10]   . 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 Cocaine abuse with cocaine-induced anxiety disorder ([place]) [F14.180] 05/01/2017  . Polysubstance abuse ([place]) [F19.10] 03/18/2017  . Intentional drug overdose ([place]) [T50.902A] 12/05/2016  . Acute encephalopathy [G93.40] 12/05/2016  . Sinus tachycardia [R00.0] 12/05/2016  . Hypotension [I95.9] 12/05/2016  . Overdose, intentional self-harm, initial encounter ([place]) [T50.902A] 11/20/2016  . Intentional SSRI (selective serotonin reuptake inhibitor) overdose ([place]) [T43.222A] 11/20/2016  . Substance induced mood disorder ([place]) [F19.94] 09/08/2016  . Homelessness [Z59.0] 09/02/2016  . Attention deficit hyperactivity disorder (ADHD) [F90.9] 07/03/2016  . 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 Tobacco use disorder [F17.200] 07/03/2016  . Unspecified depressive  disorder [F32.9] 07/03/2016  . 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 Dextromethorphan overdose [T48.3X1A] 06/03/2016  . 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 Leukocytosis [D72.829]   . Cannabis use disorder, moderate, dependence ([place]) [F12.20] 12/04/2012    Total Time spent with patient: 45 minutes  Subjective:   Thomas [name] is a 21 y.o. male patient admitted with .  HPI: Pt was seen and chart reviewed with treatment team and Dr Mariea Clonts.  Pt denies suicidal/homicidal ideation, Denies auditory/visual hallucinations and does not appear to be responding to internal stimuli. Pt stated he drank  several bottles of cough medicine to get high. Pt [facility] and is under the influence of cough medicine. Pt has a long history of substance abuse. UDS positive for cocaine, BAL negative. Pt has had 16 ED visits in the past six months. Pt is stable today and psychiatrically clear for discharge.   Past Psychiatric History: As above  Risk to Self: None Risk to Others: None Prior Inpatient Therapy: Prior Inpatient Therapy: Yes Prior Therapy Dates: 2018 Prior Therapy Facilty[facility]Provider([facility] Reason for Treatment: MH SA issues Prior Outpatient Therapy: Prior Outpatient Therapy: Yes Prior Therapy Dates: 2018 Prior Therapy Facilty/Provider(s): [facility] Reason for Treatment: Med mang Does patient have an ACCT team?: Yes Does patient have Intensive In-House Services?  : No Does patient have [facility] services? : No Does patient have P4CC services?: No  Past Medical History:  Past Medical History:  Diagnosis Date  . ADHD (attention deficit hyperactivity disorder)   . Anxiety   . Bipolar 1 disorder ([place])   . Current smoker   . Depression   . Eating disorder   . Headache(784.0)   . History of ADHD 11/01/2015  . Medical history non-contributory   . Mental disorder   . Nicotine dependence 11/03/2015  . Psychoactive substance-induced mood disorder ([place]) 10/28/2015   History reviewed. No pertinent surgical history. Family History: History reviewed. No pertinent family history. Family Psychiatric  History: Unknown Social History:  Social History   Substance and Sexual Activity  Alcohol Use No     Social History   Substance and Sexual Activity  Drug Use Yes  . Types: Marijuana, Cocaine, LSD, MDMA (Ecstacy), Methamphetamines   Comment: Pt reports using Molly as well and reports using these drugs     Social History   Socioeconomic History  . Marital status: Single    Spouse name:  None  . Number of children: None  . Years of education: None  . Highest  education level: None  Social Needs  . Financial resource strain: None  . Food insecurity - worry: None  . Food insecurity - inability: None  . Transportation needs - medical: None  . Transportation needs - non-medical: None  Occupational History  . None  Tobacco Use  . Smoking status: Current Some Day Smoker    Packs/day: 0.00    Years: 4.00    Pack years: 0.00    Types: Cigarettes  . Smokeless tobacco: Never Used  Substance and Sexual Activity  . Alcohol use: No  . Drug use: Yes    Types: Marijuana, Cocaine, LSD, MDMA (Ecstacy), Methamphetamines    Comment: Pt reports using Molly as well and reports using these drugs   . Sexual activity: Yes    Birth control/protection: None  Other Topics Concern  . None  Social History Narrative  . None   Additional Social History: N/A    Allergies:  No Known Allergies  Labs:  Results for orders placed or performed during the hospital encounter of 09/24/17 (from the past 48 hour(s))  Rapid urine drug screen (hospital performed)     Status: Abnormal   Collection Time: 09/24/17  8:32 AM  Result Value Ref Range   Opiates NONE DETECTED NONE DETECTED   Cocaine POSITIVE (A) NONE DETECTED   Benzodiazepines NONE DETECTED NONE DETECTED   Amphetamines NONE DETECTED NONE DETECTED   Tetrahydrocannabinol NONE DETECTED NONE DETECTED   Barbiturates NONE DETECTED NONE DETECTED    Comment:        DRUG SCREEN FOR MEDICAL PURPOSES ONLY.  IF CONFIRMATION IS NEEDED FOR ANY PURPOSE, NOTIFY LAB WITHIN 5 DAYS.        LOWEST DETECTABLE LIMITS FOR URINE DRUG SCREEN Drug Class       Cutoff (ng/mL) Amphetamine      1000 Barbiturate      200 Benzodiazepine   397 Tricyclics       673 Opiates          300 Cocaine          300 THC              50   Comprehensive metabolic panel     Status: Abnormal   Collection Time: 09/24/17  9:33 AM  Result Value Ref Range   Sodium 138 135 - 145 mmol/L   Potassium 3.9 3.5 - 5.1 mmol/L   Chloride 101 101 - 111  mmol/L   CO2 28 22 - 32 mmol/L   Glucose, Bld 85 65 - 99 mg/dL   BUN 18 6 - 20 mg/dL   Creatinine, Ser 0.92 0.61 - 1.24 mg/dL   Calcium 9.5 8.9 - 10.3 mg/dL   Total Protein 8.1 6.5 - 8.1 g/dL   Albumin 4.8 3.5 - 5.0 g/dL   AST 27 15 - 41 U/L   ALT 22 17 - 63 U/L   Alkaline Phosphatase 63 38 - 126 U/L   Total Bilirubin 1.3 (H) 0.3 - 1.2 mg/dL   GFR calc non Af Amer >60 >60 mL/min   GFR calc Af Amer >60 >60 mL/min    Comment: (NOTE) The eGFR has been calculated using the CKD EPI equation. This calculation has not been validated in all clinical situations. eGFR's persistently <60 mL/min signify possible Chronic Kidney Disease.    Anion gap 9 5 - 15  Ethanol     Status: None  Collection Time: 09/24/17  9:33 AM  Result Value Ref Range   Alcohol, Ethyl (B) <10 <10 mg/dL    Comment:        LOWEST DETECTABLE LIMIT FOR SERUM ALCOHOL IS 10 mg/dL FOR MEDICAL PURPOSES ONLY   Salicylate level     Status: None   Collection Time: 09/24/17  9:33 AM  Result Value Ref Range   Salicylate Lvl <7.7 2.8 - 30.0 mg/dL  Acetaminophen level     Status: Abnormal   Collection Time: 09/24/17  9:33 AM  Result Value Ref Range   Acetaminophen (Tylenol), Serum <10 (L) 10 - 30 ug/mL    Comment:        THERAPEUTIC CONCENTRATIONS VARY SIGNIFICANTLY. A RANGE OF 10-30 ug/mL MAY BE AN EFFECTIVE CONCENTRATION FOR MANY PATIENTS. HOWEVER, SOME ARE BEST TREATED AT CONCENTRATIONS OUTSIDE THIS RANGE. ACETAMINOPHEN CONCENTRATIONS >150 ug/mL AT 4 HOURS AFTER INGESTION AND >50 ug/mL AT 12 HOURS AFTER INGESTION ARE OFTEN ASSOCIATED WITH TOXIC REACTIONS.   cbc     Status: Abnormal   Collection Time: 09/24/17  9:33 AM  Result Value Ref Range   WBC 13.1 (H) 4.0 - 10.5 K/uL   RBC 4.98 4.22 - 5.81 MIL/uL   Hemoglobin 15.8 13.0 - 17.0 g/dL   HCT 44.8 39.0 - 52.0 %   MCV 90.0 78.0 - 100.0 fL   MCH 31.7 26.0 - 34.0 pg   MCHC 35.3 30.0 - 36.0 g/dL   RDW 12.8 11.5 - 15.5 %   Platelets 231 150 - 400 K/uL     Current Facility-Administered Medications  Medication Dose Route Frequency Provider Last Rate Last Dose  . LORazepam (ATIVAN) tablet 1 mg  1 mg Oral TID PRN Ethelene Hal, NP       Current Outpatient Medications  Medication Sig Dispense Refill  . paliperidone (INVEGA SUSTENNA) 234 MG/1.5ML SUSP injection Inject 234 mg into the muscle every 30 (thirty) days.      Musculoskeletal: Strength & Muscle Tone: within normal limits Gait & Station: normal Patient leans: N/A  Psychiatric Specialty Exam: Physical Exam  Constitutional: He is oriented to person, place, and time. He appears well-developed and well-nourished.  HENT:  Head: Normocephalic.  Respiratory: Effort normal.  Musculoskeletal: Normal range of motion.  Neurological: He is alert and oriented to person, place, and time.  Psychiatric: He has a normal mood and affect. His speech is normal and behavior is normal. Thought content normal. Cognition and memory are impaired. He expresses impulsivity.    Review of Systems  All other systems reviewed and are negative.   Blood pressure 110/60, pulse (!) 106, temperature 98.5 F (36.9 C), resp. rate 20, height _0  (1.676 m), weight 72.6 kg (160 lb), SpO2 98 %.Body mass index is 25.82 kg/m.  General Appearance: Casual  Eye Contact:  Good  Speech:  Clear and Coherent  Volume:  Normal  Mood:  Euthymic  Affect:  Congruent  Thought Process:  Coherent and Linear  Orientation:  Full (Time, Place, and Person)  Thought Content:  Logical  Suicidal Thoughts:  No  Homicidal Thoughts:  No  Memory:  Immediate;   Good Recent;   Good Remote;   Fair  Judgement:  Poor  Insight:  Lacking  Psychomotor Activity:  Normal  Concentration:  Concentration: Good and Attention Span: Good  Recall:  Good  Fund of Knowledge:  Good  Language:  Good  Akathisia:  No  Handed:  Right  AIMS (if indicated):    N/A  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   Patient does not meet criteria for psychiatric inpatient admission. Supportive therapy provided about ongoing stressors. Discussed crisis plan, support from social network, calling 911, coming to the Emergency Department, and calling Suicide Hotline.  Ethelene Hal, NP 09/25/2017 11:23 AM   Patient seen face-to-face for psychiatric evaluation, chart reviewed and case discussed with the physician extender and developed treatment plan. Reviewed the information documented and agree with the treatment plan.  Buford Dresser, DO

## 2017-09-25 NOTE — Discharge Instructions (Signed)
For your behavioral health needs, you are advised to continue treatment with the [facility]: ° °     [facility] Team °     [facility], [address] °     [address] °     [place], [place]  [address] °     [phone] °     Crisis number: [phone] °

## 2017-09-25 NOTE — ED Notes (Signed)
Pt discharged safely.  Pt was in no distress.  Pt left his discharge instructions in bathroom after changing and left a mess of trash and dirty socks on floor.  I reviewed discharge instructions with patient prior to his leaving.

## 2017-09-25 NOTE — [facility] Suicide Risk Assessment (Signed)
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 Polysubstance dependence (HCC) [F19.20] 07/17/2017  . Cocaine abuse (HCC) [F14.10]   . 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 Polysubstance abuse (HCC) [F19.10] 03/18/2017  . Intentional drug overdose (HCC) [T50.902A] 12/05/2016  . Acute encephalopathy [G93.40] 12/05/2016  . Sinus tachycardia [R00.0] 12/05/2016  . Hypotension [I95.9] 12/05/2016  . Overdose, intentional self-harm, initial encounter (HCC) [T50.902A] 11/20/2016  . Intentional SSRI (selective serotonin reuptake inhibitor) overdose (HCC) [T43.222A] 11/20/2016  . 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 Homelessness [Z59.0] 09/02/2016  . Attention deficit hyperactivity disorder (ADHD) [F90.9] 07/03/2016  . 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 Tobacco use disorder [F17.200] 07/03/2016  . Unspecified depressive  disorder [F32.9] 07/03/2016  . 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 Dextromethorphan overdose [T48.3X1A] 06/03/2016  . 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 Leukocytosis [D72.829]   . Cannabis use disorder, moderate, dependence (HCC) [F12.20] 12/04/2012    Total Time spent with patient: 45 minutes  Musculoskeletal: Strength & Muscle Tone: within normal limits Gait & Station: normal Patient leans: N/A   Psychiatric Specialty Exam: Physical Exam  Constitutional: He is oriented to person, place, and time. He appears well-developed and well-nourished.  HENT:  Head: Normocephalic.  Respiratory: Effort normal.  Musculoskeletal: Normal range of motion.   Neurological: He is alert and oriented to person, place, and time.  Psychiatric: He has a normal mood and affect. His speech is normal and behavior is normal. Thought content normal. Cognition and memory are impaired. He expresses impulsivity.   ROS Blood pressure 110/60, pulse (!) 106, temperature 98.5 F (36.9 C), resp. rate 20, height 5\' 6"  (1.676 m), weight 72.6 kg (160 lb), SpO2 98 %.Body mass index is 25.82 kg/m. General Appearance: Casual Eye Contact:  Good Speech:  Clear and Coherent Volume:  Normal Mood:  Euthymic Affect:  Congruent Thought Process:  Coherent and Linear Orientation:  Full (Time, Place, and Person) Thought Content:  Logical Suicidal Thoughts:  No Homicidal Thoughts:  No Memory:  Immediate;   Good Recent;   Good Remote;   Fair Judgement:  Poor Insight:  Lacking Psychomotor Activity:  Normal Concentration:  Concentration: Good and Attention Span: Good Recall:  Good Fund of Knowledge:  Good Language:  Good Akathisia:  No Handed:  Right AIMS (if indicated):    Assets:  Communication Skills Resilience ADL's:  Intact Cognition:  WNL   Mental Status Per Nursing Assessment::   On Admission:   Impaired  Demographic Factors:  Male, Adolescent or young adult, Caucasian, Low socioeconomic status and Unemployed  Loss Factors: Legal issues and Financial problems/change in socioeconomic status  Historical Factors: Impulsivity  Risk Reduction Factors:   Sense of responsibility to family  Continued Clinical Symptoms:  Depression:   Impulsivity Alcohol/Substance Abuse/Dependencies  Cognitive Features That Contribute To Risk:  Closed-mindedness    Suicide Risk:  Minimal: No identifiable suicidal ideation.  Patients presenting with no risk factors but with morbid ruminations; may be classified as minimal risk based on the severity of the depressive symptoms    Plan Of Care/Follow-up recommendations:  Activity:  as tolerated Diet:  Heart  Healthy  Laveda AbbeLaurie Britton Latonja Bobeck, NP 09/25/2017, 11:35 AM

## 2017-10-03 ENCOUNTER — Encounter (HOSPITAL_COMMUNITY): Payer: Self-pay | Admitting: Emergency Medicine

## 2017-10-03 ENCOUNTER — Emergency Department (HOSPITAL_COMMUNITY)
Admission: EM | Admit: 2017-10-03 | Discharge: 2017-10-03 | Disposition: A | Discharge status: Home / Self Care | Payer: Medicaid Other | Attending: Emergency Medicine | Admitting: Emergency Medicine

## 2017-10-03 DIAGNOSIS — F1410 Cocaine abuse, uncomplicated: Secondary | ICD-10-CM | POA: Insufficient documentation

## 2017-10-03 DIAGNOSIS — F429 Obsessive-compulsive disorder, unspecified: Secondary | ICD-10-CM | POA: Diagnosis not present

## 2017-10-03 DIAGNOSIS — F1210 Cannabis abuse, uncomplicated: Secondary | ICD-10-CM

## 2017-10-03 DIAGNOSIS — F909 Attention-deficit hyperactivity disorder, unspecified type: Secondary | ICD-10-CM | POA: Insufficient documentation

## 2017-10-03 DIAGNOSIS — F3132 Bipolar disorder, current episode depressed, moderate: Secondary | ICD-10-CM | POA: Insufficient documentation

## 2017-10-03 DIAGNOSIS — F1920 Other psychoactive substance dependence, uncomplicated: Secondary | ICD-10-CM | POA: Diagnosis not present

## 2017-10-03 DIAGNOSIS — F1994 Other psychoactive substance use, unspecified with psychoactive substance-induced mood disorder: Secondary | ICD-10-CM

## 2017-10-03 DIAGNOSIS — F17210 Nicotine dependence, cigarettes, uncomplicated: Secondary | ICD-10-CM | POA: Diagnosis not present

## 2017-10-03 DIAGNOSIS — F329 Major depressive disorder, single episode, unspecified: Secondary | ICD-10-CM | POA: Diagnosis present

## 2017-10-03 DIAGNOSIS — R45851 Suicidal ideations: Secondary | ICD-10-CM | POA: Diagnosis not present

## 2017-10-03 LAB — CBC WITH DIFFERENTIAL/PLATELET
BASOS PCT: 0 %
Basophils Absolute: 0 10*3/uL (ref 0.0–0.1)
EOS ABS: 0.1 10*3/uL (ref 0.0–0.7)
EOS PCT: 0 %
HCT: 44.4 % (ref 39.0–52.0)
HEMOGLOBIN: 15.7 g/dL (ref 13.0–17.0)
LYMPHS ABS: 2.1 10*3/uL (ref 0.7–4.0)
Lymphocytes Relative: 18 %
MCH: 32 pg (ref 26.0–34.0)
MCHC: 35.4 g/dL (ref 30.0–36.0)
MCV: 90.4 fL (ref 78.0–100.0)
MONOS PCT: 9 %
Monocytes Absolute: 1 10*3/uL (ref 0.1–1.0)
NEUTROS PCT: 73 %
Neutro Abs: 8.1 10*3/uL — ABNORMAL HIGH (ref 1.7–7.7)
PLATELETS: 228 10*3/uL (ref 150–400)
RBC: 4.91 MIL/uL (ref 4.22–5.81)
RDW: 12.8 % (ref 11.5–15.5)
WBC: 11.2 10*3/uL — ABNORMAL HIGH (ref 4.0–10.5)

## 2017-10-03 LAB — COMPREHENSIVE METABOLIC PANEL
ALT: 25 U/L (ref 17–63)
ANION GAP: 15 (ref 5–15)
AST: 34 U/L (ref 15–41)
Albumin: 4.9 g/dL (ref 3.5–5.0)
Alkaline Phosphatase: 69 U/L (ref 38–126)
BUN: 15 mg/dL (ref 6–20)
CALCIUM: 10 mg/dL (ref 8.9–10.3)
CHLORIDE: 102 mmol/L (ref 101–111)
CO2: 21 mmol/L — ABNORMAL LOW (ref 22–32)
CREATININE: 0.87 mg/dL (ref 0.61–1.24)
Glucose, Bld: 102 mg/dL — ABNORMAL HIGH (ref 65–99)
Potassium: 4.2 mmol/L (ref 3.5–5.1)
Sodium: 138 mmol/L (ref 135–145)
Total Bilirubin: 0.8 mg/dL (ref 0.3–1.2)
Total Protein: 8.3 g/dL — ABNORMAL HIGH (ref 6.5–8.1)

## 2017-10-03 LAB — ETHANOL

## 2017-10-03 LAB — RAPID URINE DRUG SCREEN, HOSP PERFORMED
AMPHETAMINES: NOT DETECTED
BENZODIAZEPINES: NOT DETECTED
Barbiturates: NOT DETECTED
Cocaine: POSITIVE — AB
OPIATES: NOT DETECTED
TETRAHYDROCANNABINOL: NOT DETECTED

## 2017-10-03 LAB — ACETAMINOPHEN LEVEL

## 2017-10-03 LAB — SALICYLATE LEVEL: Salicylate Lvl: <7 mg/dL (ref 2.8–30.0)

## 2017-10-03 MED ORDER — ONDANSETRON HCL 4 MG PO TABS: 4.0000 mg | ORAL_TABLET | Freq: Three times a day (TID) | ORAL | Status: DC | PRN

## 2017-10-03 MED ORDER — RAMELTEON 8 MG PO TABS
8.0000 mg | ORAL_TABLET | ORAL | Status: AC
  Administered 2017-10-03: 8 mg via ORAL
  Filled 2017-10-03: qty 1

## 2017-10-03 MED ORDER — ACETAMINOPHEN 325 MG PO TABS: 650.0000 mg | ORAL_TABLET | ORAL | Status: DC | PRN

## 2017-10-03 MED ORDER — NICOTINE 21 MG/24HR TD PT24: 21.0000 mg | MEDICATED_PATCH | Freq: Every day | TRANSDERMAL | Status: DC
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
## 2017-10-03 NOTE — ED Notes (Signed)
Patient reports SI with a plan to over dose. Plan of care discussed. Encouragement and support provided and safety maintain. Q 15 min safety checks in place and video monitoring.

## 2017-10-03 NOTE — ED Notes (Signed)
Pt discharged home. Discharged instructions read to pt who verbalized understanding. All belongings returned to pt who signed for same. Denies SI/HI, is not delusional and not responding to internal stimuli. Escorted pt to the ED exit.    

## 2017-10-03 NOTE — Consult Note (Addendum)
[facility] Face-to-Face Psychiatry Consult   Reason for Consult:  Substance Abuse Referring Physician:  EPD Patient Identification: Thomas [name] MRN:  [id number] Principal Diagnosis: Other psychoactive substance use, unspecified with psychoactive substance-induced mood disorder ([place]) Diagnosis:   Patient Active Problem List   Diagnosis Date Noted  . Polysubstance dependence ([place]) [F19.20] 07/17/2017  . Cocaine abuse ([place]) [F14.10]   . 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 Cocaine abuse with cocaine-induced anxiety disorder ([place]) [F14.180] 05/01/2017  . Polysubstance abuse ([place]) [F19.10] 03/18/2017  . Intentional drug overdose ([place]) [T50.902A] 12/05/2016  . Acute encephalopathy [G93.40] 12/05/2016  . Sinus tachycardia [R00.0] 12/05/2016  . Hypotension [I95.9] 12/05/2016  . Overdose, intentional self-harm, initial encounter ([place]) [T50.902A] 11/20/2016  . Intentional SSRI (selective serotonin reuptake inhibitor) overdose ([place]) [T43.222A] 11/20/2016  . Substance induced mood disorder ([place]) [F19.94] 09/08/2016  . Homelessness [Z59.0] 09/02/2016  . Attention deficit hyperactivity disorder (ADHD) [F90.9] 07/03/2016  . 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 Tobacco use disorder [F17.200] 07/03/2016  . Unspecified depressive  disorder [F32.9] 07/03/2016  . 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 Dextromethorphan overdose [T48.3X1A] 06/03/2016  . 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 Leukocytosis [D72.829]   . Cannabis use disorder, moderate, dependence ([place]) [F12.20] 12/04/2012    Total Time spent with patient: 20 minutes  Subjective:   Thomas [name] is a 21 y.o. male reports feeling better this morning, reports a lot os "sole searching" over night. 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 Patient reports he has $75.00 and has plans to  buy a tent and stay close to the [facility]. Thomas [name] was evaluated by MD and NP. Patient is denying suicidal or homicidal ideations. TTS to provided additional resource for [facility] and consult placed for peer support. Support, Encouragement  and Reassurance was provided  HPI: Per assessment note-Thomas [name] is an 21 y.o. male who presents to the ED voluntarily. Pt reports he has been suicidal with a plan to OD on heroin. Pt states he attempted to purchase heroin in order to OD however after he gave the person his money for the drugs, he kept his money and did not give him heroin. Pt reports he has attempted suicide in the past and has been hospitalized c/o Bipolar I disorder multiple times.   Per chart, Pt has 17 ED visits in the past 6 months. Pt was previously assessed by TTS twice on 08/03/17, once on 08/08/17, 08/10/17 and 09/24/17. Pt frequently visits the ED expressing SI and SA. Pt states he is currently homeless and feels helpless. Pt was experiencing thought blocking throughout the assessment. Pt was often asked a question and looked away blindly without responding. Pt would then say "huh, what did you ask" after several seconds of silence. During the assessment the pt was observed picking the skin on his hands and putting it in his mouth.   Pt endorses ongoing AVH with command to kill himself. Pt states his AH are becoming worse and he is struggling to determine what is reality and what is AVH. Pt states he is living a "bad lifestyle" and continues to ask this Probation officer for recommendations for long term residential treatment. Pt states he feels people are "out to get him" and he is unable to "get better" unless he can go to a long term treatment center.   Pt reports he has not slept in several days. Pt states he  has been using cocaine daily. Pt became tearful as he stated he wises to stop using cocaine but feels helpless. Pt states he does have court but it was cancelled due to the recent snow and he  is unsure when it will be rescheduled. Pt states he has pending charges for shoplifting. Pt reports he spends all of his money on drugs and shoplifts in order to have the necessities. Pt states he is currently on disability and has a payee that handles his finances   Past Psychiatric History: Psychoactive substance induced mood disorder, depress, anxiety and ADHD.   Risk to Self: Suicidal Ideation: Currently denies. Risk to Others: Homicidal Ideation: No Thoughts of Harm to Others: No Current Homicidal Intent: No Current Homicidal Plan: No Access to Homicidal Means: No History of harm to others?: No Assessment of Violence: None Noted Does patient have access to weapons?: No Criminal Charges Pending?: Yes Describe Pending Criminal Charges: larceny  Does patient have a court date: Yes Court Date: (pt unable to recall ) Prior Inpatient Therapy: Prior Inpatient Therapy: Yes Prior Therapy Dates: 2018, 2017 and mult other admits Prior Therapy Facilty/Provider(s): [facility], [facility], [facility], [facility] Reason for Treatment: MH SA issues Prior Outpatient Therapy: Prior Outpatient Therapy: Yes Prior Therapy Dates: current Prior Therapy Facilty/Provider(s): [facility] Reason for Treatment: Med management  Does patient have an ACCT team?: Yes([facility]) Does patient have Intensive In-House Services?  : No Does patient have [facility] services? : No Does patient have P4CC services?: No  Past Medical History:  Past Medical History:  Diagnosis Date  . ADHD (attention deficit hyperactivity disorder)   . Anxiety   . Bipolar 1 disorder ([place])   . Current smoker   . Depression   . Eating disorder   . Headache(784.0)   . History of ADHD 11/01/2015  . Medical history non-contributory   . Mental disorder   . Nicotine dependence 11/03/2015  . Psychoactive substance-induced mood disorder ([place]) 10/28/2015   History reviewed. No pertinent surgical history. Family History: No family history on file. Family Psychiatric   History: Maternal grandfather-PTSD, committed suicide; mother:-prior substance abuse; father-bipolar disorder, substance abuse; paternal grandmother-bipolar disorder, substance abuse and paternal grandfather-bipolar disorder, substance abuse.  Social History:  Social History   Substance and Sexual Activity  Alcohol Use No     Social History   Substance and Sexual Activity  Drug Use Yes  . Types: Marijuana, Cocaine, LSD, MDMA (Ecstacy), Methamphetamines   Comment: Pt reports using Molly as well and reports using these drugs     Social History   Socioeconomic History  . Marital status: Single    Spouse name: None  . Number of children: None  . Years of education: None  . Highest education level: None  Social Needs  . Financial resource strain: None  . Food insecurity - worry: None  . Food insecurity - inability: None  . Transportation needs - medical: None  . Transportation needs - non-medical: None  Occupational History  . None  Tobacco Use  . Smoking status: Current Some Day Smoker    Packs/day: 0.00    Years: 4.00    Pack years: 0.00    Types: Cigarettes  . Smokeless tobacco: Never Used  Substance and Sexual Activity  . Alcohol use: No  . Drug use: Yes    Types: Marijuana, Cocaine, LSD, MDMA (Ecstacy), Methamphetamines    Comment: Pt reports using Molly as well and reports using these drugs   . Sexual activity: Yes  Birth control/protection: None  Other Topics Concern  . None  Social History Narrative  . None   Additional Social History: N/A    Allergies:  No Known Allergies  Labs:  Results for orders placed or performed during the hospital encounter of 10/03/17 (from the past 48 hour(s))  Rapid urine drug screen (hospital performed)     Status: Abnormal   Collection Time: 10/03/17  3:30 AM  Result Value Ref Range   Opiates NONE DETECTED NONE DETECTED   Cocaine POSITIVE (A) NONE DETECTED   Benzodiazepines NONE DETECTED NONE DETECTED   Amphetamines  NONE DETECTED NONE DETECTED   Tetrahydrocannabinol NONE DETECTED NONE DETECTED   Barbiturates NONE DETECTED NONE DETECTED    Comment: (NOTE) DRUG SCREEN FOR MEDICAL PURPOSES ONLY.  IF CONFIRMATION IS NEEDED FOR ANY PURPOSE, NOTIFY LAB WITHIN 5 DAYS. LOWEST DETECTABLE LIMITS FOR URINE DRUG SCREEN Drug Class                     Cutoff (ng/mL) Amphetamine and metabolites    1000 Barbiturate and metabolites    200 Benzodiazepine                 025 Tricyclics and metabolites     300 Opiates and metabolites        300 Cocaine and metabolites        300 THC                            50   CBC with Differential     Status: Abnormal   Collection Time: 10/03/17  3:40 AM  Result Value Ref Range   WBC 11.2 (H) 4.0 - 10.5 K/uL   RBC 4.91 4.22 - 5.81 MIL/uL   Hemoglobin 15.7 13.0 - 17.0 g/dL   HCT 44.4 39.0 - 52.0 %   MCV 90.4 78.0 - 100.0 fL   MCH 32.0 26.0 - 34.0 pg   MCHC 35.4 30.0 - 36.0 g/dL   RDW 12.8 11.5 - 15.5 %   Platelets 228 150 - 400 K/uL   Neutrophils Relative % 73 %   Neutro Abs 8.1 (H) 1.7 - 7.7 K/uL   Lymphocytes Relative 18 %   Lymphs Abs 2.1 0.7 - 4.0 K/uL   Monocytes Relative 9 %   Monocytes Absolute 1.0 0.1 - 1.0 K/uL   Eosinophils Relative 0 %   Eosinophils Absolute 0.1 0.0 - 0.7 K/uL   Basophils Relative 0 %   Basophils Absolute 0.0 0.0 - 0.1 K/uL  Comprehensive metabolic panel     Status: Abnormal   Collection Time: 10/03/17  3:40 AM  Result Value Ref Range   Sodium 138 135 - 145 mmol/L   Potassium 4.2 3.5 - 5.1 mmol/L   Chloride 102 101 - 111 mmol/L   CO2 21 (L) 22 - 32 mmol/L   Glucose, Bld 102 (H) 65 - 99 mg/dL   BUN 15 6 - 20 mg/dL   Creatinine, Ser 0.87 0.61 - 1.24 mg/dL   Calcium 10.0 8.9 - 10.3 mg/dL   Total Protein 8.3 (H) 6.5 - 8.1 g/dL   Albumin 4.9 3.5 - 5.0 g/dL   AST 34 15 - 41 U/L   ALT 25 17 - 63 U/L   Alkaline Phosphatase 69 38 - 126 U/L   Total Bilirubin 0.8 0.3 - 1.2 mg/dL   GFR calc non Af Amer >60 >60 mL/min   GFR calc Af Amer  >  60 >60 mL/min    Comment: (NOTE) The eGFR has been calculated using the CKD EPI equation. This calculation has not been validated in all clinical situations. eGFR's persistently <60 mL/min signify possible Chronic Kidney Disease.    Anion gap 15 5 - 15  Ethanol     Status: None   Collection Time: 10/03/17  3:40 AM  Result Value Ref Range   Alcohol, Ethyl (B) <10 <10 mg/dL    Comment:        LOWEST DETECTABLE LIMIT FOR SERUM ALCOHOL IS 10 mg/dL FOR MEDICAL PURPOSES ONLY   Acetaminophen level     Status: Abnormal   Collection Time: 10/03/17  3:40 AM  Result Value Ref Range   Acetaminophen (Tylenol), Serum <10 (L) 10 - 30 ug/mL    Comment:        THERAPEUTIC CONCENTRATIONS VARY SIGNIFICANTLY. A RANGE OF 10-30 ug/mL MAY BE AN EFFECTIVE CONCENTRATION FOR MANY PATIENTS. HOWEVER, SOME ARE BEST TREATED AT CONCENTRATIONS OUTSIDE THIS RANGE. ACETAMINOPHEN CONCENTRATIONS >150 ug/mL AT 4 HOURS AFTER INGESTION AND >50 ug/mL AT 12 HOURS AFTER INGESTION ARE OFTEN ASSOCIATED WITH TOXIC REACTIONS.   Salicylate level     Status: None   Collection Time: 10/03/17  3:40 AM  Result Value Ref Range   Salicylate Lvl <4.4 2.8 - 30.0 mg/dL    Current Facility-Administered Medications  Medication Dose Route Frequency Provider Last Rate Last Dose  . acetaminophen (TYLENOL) tablet 650 mg  650 mg Oral Q4H PRN Antonietta Breach, PA-C      . nicotine (NICODERM CQ - dosed in mg/24 hours) patch 21 mg  21 mg Transdermal Daily Humes, Claiborne Billings, PA-C      . ondansetron (ZOFRAN) tablet 4 mg  4 mg Oral Q8H PRN Antonietta Breach, PA-C       Current Outpatient Medications  Medication Sig Dispense Refill  . paliperidone (INVEGA SUSTENNA) 234 MG/1.5ML SUSP injection Inject 234 mg into the muscle every 30 (thirty) days.      Musculoskeletal: Strength & Muscle Tone: within normal limits Gait & Station: normal Patient leans: N/A  Psychiatric Specialty Exam: Physical Exam  Nursing note and vitals  reviewed. Constitutional: He is oriented to person, place, and time. He appears well-developed and well-nourished.  HENT:  Head: Normocephalic and atraumatic.  Neck: Normal range of motion.  Respiratory: Effort normal.  Musculoskeletal: Normal range of motion.  Neurological: He is alert and oriented to person, place, and time.  Psychiatric: He has a normal mood and affect. His speech is normal and behavior is normal. Judgment and thought content normal. Cognition and memory are normal.    Review of Systems  Psychiatric/Behavioral: Negative for depression and suicidal ideas.  All other systems reviewed and are negative.   Blood pressure 126/84, pulse 94, temperature 97.9 F (36.6 C), temperature source Oral, resp. rate 20, SpO2 99 %.There is no height or weight on file to calculate BMI.     General Appearance: Casual  Eye Contact::  Fair  Speech:  Clear and Coherent  Volume:  Normal  Mood:  Anxious  Affect:  Congruent  Thought Process:  Coherent  Orientation:  Full (Time, Place, and Person)  Thought Content:  Hallucinations: None  Suicidal Thoughts:  No  Homicidal Thoughts:  No  Memory:  Immediate;   Fair Recent;   Fair Remote;   Fair  Judgement:  Fair  Insight:  Fair  Psychomotor Activity:  Normal  Concentration:  Fair  Recall:  [employer] of Knowledge:Fair  Language: Kermit Balo  Akathisia:  No  Handed:  Right  AIMS (if indicated):   N/A  Assets:  Communication Skills Physical Health Resilience Social Support  Sleep:     Cognition: WNL  ADL's:  Intact    Disposition:  Patient does not meet criteria for psychiatric inpatient admission. Discussed crisis plan, support from social network, calling 911, coming to the Emergency Department, and calling Suicide Hotline. - F/u with [facility]  Derrill [place], NP 10/03/2017 10:18 AM   Patient seen face-to-face for psychiatric evaluation, chart reviewed and case discussed with the physician extender and developed treatment plan.  Reviewed the information documented and agree with the treatment plan.  Buford Dresser, DO

## 2017-10-03 NOTE — ED Provider Notes (Signed)
[facility]-EMERGENCY DEPT Provider Note   CSN: [id number] Arrival date & time: 10/03/17  [id number]     History   Chief Complaint Chief Complaint  Patient presents with  . Suicidal  . Depression    HPI Thomas [name] is a 21 y.o. male.  21 year old male with a history of ADHD, anxiety, bipolar 1 disorder presents to the emergency department for psychiatric evaluation.  He states that he has been practicing Satanism and "demon" things and needs "to be stabilized". He states that he tried to find some heroin to overdose on, but "that is difficult because I'm homeless". He reports a hx of recreational heroin and other illicit drug use. He wants to be placed in a facility "long term" so he can get back on medications. He states that he feels like he "has hit rock bottom". No homicidal ideations.     Past Medical History:  Diagnosis Date  . ADHD (attention deficit hyperactivity disorder)   . Anxiety   . Bipolar 1 disorder (HCC)   . Current smoker   . Depression   . Eating disorder   . Headache(784.0)   . History of ADHD 11/01/2015  . Medical history non-contributory   . Mental disorder   . Nicotine dependence 11/03/2015  . 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 Polysubstance dependence (HCC) 07/17/2017  . Cocaine abuse ([facility])   . 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 Polysubstance abuse (HCC) 03/18/2017  . Intentional drug overdose (HCC) 12/05/2016  . Acute encephalopathy 12/05/2016  . Sinus tachycardia 12/05/2016  . Hypotension 12/05/2016  . Overdose, intentional self-harm, initial encounter (H[facility]) 11/20/2016  . Intentional SSRI (selective serotonin reuptake inhibitor) overdose (HCC) 11/20/2016  . Substance induced mood disorder ([facility]) 09/08/2016    . Homelessness 09/02/2016  . Attention deficit hyperactivity disorder (ADHD) 07/03/2016  . 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 Tobacco use disorder 07/03/2016  . Unspecified depressive  disorder 07/03/2016  . 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 Dextromethorphan overdose 06/03/2016  . Substance-induced psychotic disorder (HCC)   . Leukocytosis   . Cannabis use disorder, moderate, dependence (HCC) 12/04/2012    History reviewed. No pertinent surgical history.     Home Medications    Prior to Admission medications   Medication Sig Start Date End Date Taking? Authorizing Provider  paliperidone (INVEGA SUSTENNA) 234 MG/1.5ML SUSP injection Inject 234 mg into the muscle every 30 (thirty) days.    [provider]    Family History No family history on file.  Social History Social History   Tobacco Use  . Smoking status: Current Some Day Smoker    Packs/day: 0.00    Years: 4.00    Pack years: 0.00    Types: Cigarettes  . Smokeless tobacco: Never Used  Substance Use Topics  . Alcohol use: No  . Drug use: Yes    Types: Marijuana, Cocaine, LSD, MDMA (Ecstacy), Methamphetamines    Comment: Pt reports using Molly as well and reports using these drugs      Allergies   Patient has no known allergies.   Review of Systems Review of Systems Ten systems reviewed and are negative for acute change, except as noted in the HPI.    Physical Exam Updated Vital Signs BP (!) 142/93 (BP Location: Right Arm)   Pulse  81   Temp 98.5 F (36.9 C) (Oral)   Resp 16   SpO2 99%   Physical Exam  Constitutional: He is oriented to person, place, and time. He appears well-developed and well-nourished. No distress.  Nontoxic appearing; disheveled.  HENT:  Head: Normocephalic and atraumatic.  Eyes: Conjunctivae and EOM are normal. No scleral icterus.  Neck: Normal range of motion.  Pulmonary/Chest: Effort normal. No stridor. No respiratory distress.   Respirations even and unlabored  Musculoskeletal: Normal range of motion.  Neurological: He is alert and oriented to person, place, and time. He exhibits normal muscle tone. Coordination normal.  Skin: Skin is warm and dry. No rash noted. He is not diaphoretic. No erythema. No pallor.  Psychiatric: He has a normal mood and affect. His speech is rapid and/or pressured. He is hyperactive (mild). He expresses no homicidal ideation. He expresses no suicidal plans and no homicidal plans.  Disoriented thoughts  Nursing note and vitals reviewed.    ED Treatments / Results  Labs (all labs ordered are listed, but only abnormal results are displayed) Labs Reviewed  RAPID URINE DRUG SCREEN, HOSP PERFORMED - Abnormal; Notable for the following components:      Result Value   Cocaine POSITIVE (*)    All other components within normal limits  CBC WITH DIFFERENTIAL/PLATELET - Abnormal; Notable for the following components:   WBC 11.2 (*)    Neutro Abs 8.1 (*)    All other components within normal limits  COMPREHENSIVE METABOLIC PANEL - Abnormal; Notable for the following components:   CO2 21 (*)    Glucose, Bld 102 (*)    Total Protein 8.3 (*)    All other components within normal limits  ACETAMINOPHEN LEVEL - Abnormal; Notable for the following components:   Acetaminophen (Tylenol), Serum <10 (*)    All other components within normal limits  ETHANOL  SALICYLATE LEVEL    EKG  EKG Interpretation None       Radiology No results found.  Procedures Procedures (including critical care time)  Medications Ordered in ED Medications  ondansetron (ZOFRAN) tablet 4 mg (not administered)  acetaminophen (TYLENOL) tablet 650 mg (not administered)  nicotine (NICODERM CQ - dosed in mg/24 hours) patch 21 mg (not administered)     Initial Impression / Assessment and Plan / ED Course  I have reviewed the triage vital signs and the nursing notes.  Pertinent labs & imaging results that were  available during my care of the patient were reviewed by me and considered in my medical decision making (see chart for details).     Patient presents requesting psychiatric evaluation.  He has been medically cleared and evaluated by TTS.  They recommend observation and will contact the patient's ACT team in the morning.  Disposition to be determined by oncoming ED provider.   Final Clinical Impressions(s) / ED Diagnoses   Final diagnoses:  Cocaine abuse [facility](HCC)    ED Discharge Orders    None       Antony MaduraHumes, Siddiq Kaluzny, PA-C 10/03/17 0515    Ward, Layla MawKristen N, DO 10/03/17 [phone]

## 2017-10-03 NOTE — BH Assessment (Signed)
[facility]  Per Juanetta BeetsJacqueline [name], DO, this pt does not require psychiatric hospitalization at this time.  [facility].  Pt would also benefit from seeing Peer Support Specialists; they will be asked to speak to pt.  This has been included in pt's discharge instructions.  Pt's nurse, Diane, has been notified.  Thomas Canninghomas Sai Moura, MA Triage Specialist [phone]

## 2017-10-03 NOTE — BH Assessment (Signed)
[facility] Assessment Progress Note  Case discussed with Donell SievertSpencer Simon, PA who recommends continued observation for safety. TCU nurse and EDP Antony MaduraHumes, Kelly, PA-C have been notified of the disposition. TTS should attempt to contact the pt's ACTT team in the AM.  Princess BruinsAquicha Arionne Iams, MSW, LCSW Therapeutic Triage Specialist  [phone]
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
## 2017-10-03 NOTE — ED Notes (Signed)
Pt reports having a job interview today and high hopes of getting the job. He is tired of being homeless and on the street. Pt said that his $75 a week disability does not last long. He said that his is afraid that he will be drug tested at the job interview because he is positive for cocaine.

## 2017-10-03 NOTE — Discharge Instructions (Signed)
For your behavioral health needs, you are advised to continue treatment with the [facility] Team: ° °     [facility] ACT Team °     [facility], [address] °     [address] °     [name], [place]  [address] °     [phone] °     Crisis number: [phone] °

## 2017-10-03 NOTE — ED Triage Notes (Signed)
Patient brought in by [name] with complaints of suicidal ideation. Plan to overdose. States "I hate life". " I need long term help".

## 2017-10-04 ENCOUNTER — Emergency Department (HOSPITAL_COMMUNITY)
Admission: EM | Admit: 2017-10-04 | Discharge: 2017-10-05 | Disposition: A | Discharge status: Home / Self Care | Payer: Medicaid Other | Attending: Emergency Medicine | Admitting: Emergency Medicine

## 2017-10-04 ENCOUNTER — Encounter (HOSPITAL_COMMUNITY): Payer: Self-pay | Admitting: Emergency Medicine

## 2017-10-04 ENCOUNTER — Other Ambulatory Visit: Payer: Self-pay

## 2017-10-04 DIAGNOSIS — F1410 Cocaine abuse, uncomplicated: Secondary | ICD-10-CM | POA: Insufficient documentation

## 2017-10-04 DIAGNOSIS — F1910 Other psychoactive substance abuse, uncomplicated: Secondary | ICD-10-CM

## 2017-10-04 DIAGNOSIS — F1210 Cannabis abuse, uncomplicated: Secondary | ICD-10-CM | POA: Insufficient documentation

## 2017-10-04 DIAGNOSIS — R443 Hallucinations, unspecified: Secondary | ICD-10-CM | POA: Diagnosis present

## 2017-10-04 DIAGNOSIS — F909 Attention-deficit hyperactivity disorder, unspecified type: Secondary | ICD-10-CM | POA: Diagnosis not present

## 2017-10-04 DIAGNOSIS — F17210 Nicotine dependence, cigarettes, uncomplicated: Secondary | ICD-10-CM | POA: Insufficient documentation

## 2017-10-04 LAB — COMPREHENSIVE METABOLIC PANEL
ALBUMIN: 4.9 g/dL (ref 3.5–5.0)
ALK PHOS: 68 U/L (ref 38–126)
ALT: 25 U/L (ref 17–63)
ANION GAP: 9 (ref 5–15)
AST: 26 U/L (ref 15–41)
BILIRUBIN TOTAL: 1.2 mg/dL (ref 0.3–1.2)
BUN: 18 mg/dL (ref 6–20)
CALCIUM: 10.3 mg/dL (ref 8.9–10.3)
CO2: 28 mmol/L (ref 22–32)
CREATININE: 1.01 mg/dL (ref 0.61–1.24)
Chloride: 101 mmol/L (ref 101–111)
GFR calc Af Amer: >60 mL/min (ref >60)
GFR calc non Af Amer: >60 mL/min (ref >60)
GLUCOSE: 105 mg/dL — AB (ref 65–99)
Potassium: 4.1 mmol/L (ref 3.5–5.1)
SODIUM: 138 mmol/L (ref 135–145)
TOTAL PROTEIN: 8.4 g/dL — AB (ref 6.5–8.1)

## 2017-10-04 LAB — ETHANOL: Alcohol, Ethyl (B): <10 mg/dL (ref <10)

## 2017-10-04 LAB — CBC WITH DIFFERENTIAL/PLATELET
Basophils Absolute: 0 10*3/uL (ref 0.0–0.1)
Basophils Relative: 0 %
EOS ABS: 0.1 10*3/uL (ref 0.0–0.7)
Eosinophils Relative: 1 %
HCT: 48.2 % (ref 39.0–52.0)
HEMOGLOBIN: 17 g/dL (ref 13.0–17.0)
LYMPHS ABS: 2.9 10*3/uL (ref 0.7–4.0)
Lymphocytes Relative: 25 %
MCH: 32.1 pg (ref 26.0–34.0)
MCHC: 35.3 g/dL (ref 30.0–36.0)
MCV: 90.9 fL (ref 78.0–100.0)
MONO ABS: 1.2 10*3/uL — AB (ref 0.1–1.0)
MONOS PCT: 10 %
NEUTROS PCT: 64 %
Neutro Abs: 7.3 10*3/uL (ref 1.7–7.7)
Platelets: 241 10*3/uL (ref 150–400)
RBC: 5.3 MIL/uL (ref 4.22–5.81)
RDW: 12.9 % (ref 11.5–15.5)
WBC: 11.5 10*3/uL — ABNORMAL HIGH (ref 4.0–10.5)

## 2017-10-04 NOTE — ED Notes (Signed)
Pt asleep in lobby at this time. 

## 2017-10-04 NOTE — ED Provider Notes (Signed)
[facility]-EMERGENCY DEPT Provider Note   CSN: [id number] Arrival date & time: 10/04/17  2007     History   Chief Complaint Chief Complaint  Patient presents with  . Addiction Problem    HPI Thomas OliphantCaleb Vallery [name] is a 21 y.o. male with a hx of ADHD, anxiety, Bipolar disorder, polysubstance abuse presents to the Emergency Department complaining of hallucinations and wanting to hurt himself.  He is able to tell me his first name only, reports he is in a hotel room and tells me this is [id number].  He reports he has been drinking and "took lots of drugs."  He requires constant stimulation to remain alert.    Level 5 Caveat for AMS.    The history is provided by the patient and medical records. No language interpreter was used.    Past Medical History:  Diagnosis Date  . ADHD (attention deficit hyperactivity disorder)   . Anxiety   . Bipolar 1 disorder (HCC)   . Current smoker   . Depression   . Eating disorder   . Headache(784.0)   . History of ADHD 11/01/2015  . Medical history non-contributory   . Mental disorder   . Nicotine dependence 11/03/2015  . 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 Polysubstance dependence (HCC) 07/17/2017  . Other psychoactive substance use, unspecified with psychoactive substance-induced mood disorder (HCC)   . 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 Polysubstance abuse (HCC) 03/18/2017  . Intentional drug overdose (HCC) 12/05/2016  . Acute encephalopathy 12/05/2016  . Sinus tachycardia 12/05/2016  . Hypotension 12/05/2016  . Overdose, intentional self-harm, initial encounter (HCC) 11/20/2016  . Intentional SSRI (selective serotonin reuptake inhibitor) overdose (HCC) 11/20/2016  . Substance induced mood disorder (HCC)  09/08/2016  . Homelessness 09/02/2016  . Attention deficit hyperactivity disorder (ADHD) 07/03/2016  . 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 Tobacco use disorder 07/03/2016  . Unspecified depressive  disorder 07/03/2016  . 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 Dextromethorphan overdose 06/03/2016  . Substance-induced psychotic disorder (HCC)   . Leukocytosis   . Cannabis use disorder, moderate, dependence (HCC) 12/04/2012    History reviewed. No pertinent surgical history.     Home Medications    Prior to Admission medications   Medication Sig Start Date End Date Taking? Authorizing Provider  paliperidone (INVEGA SUSTENNA) 234 MG/1.5ML SUSP injection Inject 234 mg into the muscle every 30 (thirty) days.    [provider]    Family History No family history on file.  Social History Social History   Tobacco Use  . Smoking status: Current Some Day Smoker    Packs/day: 0.00    Years: 4.00    Pack years: 0.00    Types: Cigarettes  . Smokeless tobacco: Never Used  Substance Use Topics  . Alcohol use: No  . Drug use: Yes    Types: Marijuana, Cocaine, LSD, MDMA (Ecstacy), Methamphetamines    Comment: Pt reports using Molly as well and reports using these drugs      Allergies   Patient has no known allergies.   Review of Systems Review of Systems  Unable to perform ROS: Mental status change     Physical Exam Updated Vital Signs BP (!) 146/105 (BP Location: Left Arm) Comment: Patient moving arm  Pulse 92   Resp 18  SpO2 99%   Physical Exam  Constitutional: He appears well-developed and well-nourished. He appears lethargic. He is sleeping. No distress.  HENT:  Head: Normocephalic and atraumatic.  Nose: No epistaxis.  Mouth/Throat: Uvula is midline and oropharynx is clear and moist.  Eyes: Conjunctivae are normal. No scleral icterus.  Pupils dilated and minimally reactive  Neck: Normal range of motion.  Cardiovascular:  Normal rate, regular rhythm and intact distal pulses.  Pulmonary/Chest: Effort normal. No tachypnea. No respiratory distress.  Abdominal: Soft.  Musculoskeletal: Normal range of motion.  Neurological: He appears lethargic. GCS eye subscore is 2. GCS verbal subscore is 4. GCS motor subscore is 4.  Skin: Skin is warm and dry.  Psychiatric: He is actively hallucinating ( unable to specify or elaborate). He expresses suicidal ideation.  Nursing note and vitals reviewed.    ED Treatments / Results  Labs (all labs ordered are listed, but only abnormal results are displayed) Labs Reviewed  COMPREHENSIVE METABOLIC PANEL - Abnormal; Notable for the following components:      Result Value   Glucose, Bld 105 (*)    Total Protein 8.4 (*)    All other components within normal limits  RAPID URINE DRUG SCREEN, HOSP PERFORMED - Abnormal; Notable for the following components:   Cocaine POSITIVE (*)    Amphetamines POSITIVE (*)    Tetrahydrocannabinol POSITIVE (*)    All other components within normal limits  CBC WITH DIFFERENTIAL/PLATELET - Abnormal; Notable for the following components:   WBC 11.5 (*)    Monocytes Absolute 1.2 (*)    All other components within normal limits  ETHANOL    Procedures Procedures (including critical care time)  Medications Ordered in ED Medications - No data to display   Initial Impression / Assessment and Plan / ED Course  I have reviewed the triage vital signs and the nursing notes.  Pertinent labs & imaging results that were available during my care of the patient were reviewed by me and considered in my medical decision making (see chart for details).  Clinical Course as of Oct 05 617  Thu Oct 04, 2017  2346 Pt now ambulatory without assistance.  Alert.    [HM]  Fri Oct 05, 2017  0010 UDS positive for cocaine and amphetamine and marijuana  [HM]  0010 Baseline WBC: (!) 11.5 [HM]  0100 Patient reports that he took Adderall, cocaine and smoked weed  tonight.  He reports that he does feel suicidal and was attempting to hurt himself.  He denies taking any other medications.  Patient states that he is hallucinating and sees the devil but denies command hallucinations or auditory hallucinations.  He has homicidal ideation.  Patient also reports he is homeless.  He denies fever, chills, headache, neck pain, chest pain, SOB, cough, abd pain, N/V/D, weakness, dizziness, syncope.  [HM]  0129 Pt evaluated by TTS.  He does not meet inpatient criteria.  He is to follow up with his ACT team  [HM]    Clinical Course User Index [HM] Kassidi Elza, Dahlia Client, PA-C    Pt presents acutely intoxicated.  Unkown what substances pt has used.  He is unable to tell me.  Pt requires constant stimulation, but is protecting his airway.  No vomiting.  Vital signs normal.   Record shows that pt was evaluated last night for complaints of SI.  He was evaluated bu Ilene Qua, NP [facility]) and was not deemed to meet inpatient criteria.  He was discharged after discussion of crisis plan.  Pt is too intoxicated to adequately assess at this time.  Will allow to sober and reassess.    1:31 AM Patient mental status improved significantly here in the emergency department.  His labs are reassuring and he was medically cleared.  Patient evaluated by TTS who reports that he is safe for discharge and does not need inpatient criteria.  They recommend that he follow-up with his act team in the morning.  BP 124/83 (BP Location: Left Arm)   Pulse 74   Temp 97.8 F (36.6 C) (Oral)   Resp 18   Ht 5\' 6"  (1.676 m)   Wt 72.6 kg (160 lb)   SpO2 99%   BMI 25.82 kg/m    Final Clinical Impressions(s) / ED Diagnoses   Final diagnoses:  Polysubstance abuse (HCC)  Cocaine abuse [facility](HCC)  Marijuana abuse    ED Discharge Orders    None       Milta DeitersMuthersbaugh, Karolina Zamor, PA-C 10/05/17 [phone]    Melene PlanFloyd, Dan, DO 10/05/17 1611

## 2017-10-04 NOTE — ED Notes (Signed)
Bed: [facility] Expected date:  Expected time:  Means of arrival:  Comments: 

## 2017-10-04 NOTE — ED Notes (Signed)
Pt stated "The ACT team says they have me housing."  Pt admits to living on the street.

## 2017-10-04 NOTE — ED Triage Notes (Signed)
Patient awoken from lobby and brought into triage. Pt drowsy and falling asleep during triage. When asked why patient is here "Can you just take me out of here, I need to go."

## 2017-10-05 LAB — RAPID URINE DRUG SCREEN, HOSP PERFORMED
AMPHETAMINES: POSITIVE — AB
BARBITURATES: NOT DETECTED
Benzodiazepines: NOT DETECTED
Cocaine: POSITIVE — AB
Opiates: NOT DETECTED
TETRAHYDROCANNABINOL: POSITIVE — AB

## 2017-10-05 NOTE — ED Notes (Signed)
Pt provided sandwich & Sprite.  Pt escorted to WR by Security.

## 2017-10-05 NOTE — Discharge Instructions (Signed)
1. Medications: usual home medications 2. Treatment: rest, drink plenty of fluids,  3. Follow Up: Please followup with your ACT team in the morning; Please return to the ER for worsening symptoms, fevers, vomiting or other concerns

## 2017-10-05 NOTE — Progress Notes (Signed)
Per chart, "Patient has ACT team with [facility]. Eunice BlaseDebbie is contact: [phone]". TTS attempted to contact the ACTT but did not receive an answer. A HIPPA compliant voicemail was left for the ACTT.  Princess BruinsAquicha Micky Sheller, MSW, LCSW Therapeutic Triage Specialist  [phone]

## 2017-10-05 NOTE — ED Notes (Signed)
Spoke to IvanhoeHannah, PA-C questioning if pt appropriate for TCU/SAPPU.  Per Dahlia ClientHannah, pt is obtunded @ present.

## 2017-10-05 NOTE — BH Assessment (Addendum)
Assessment Note  Thomas [name] is an 21 y.o. male who presents to the ED voluntarily. Pt was recently assessed by this TTS counselor on 10/03/17. Pt has had 18 ED visits in the past 6 months. Pt was previously assessed by TTS twice on 08/03/17, once on 08/08/17, 08/10/17, 09/24/17, and 10/03/17. Per chart, pt was sleeping in the lobby prior to being brought back to triage. Pt was asked if he remained in the lobby after being d/c on 10/03/17. Pt continues falling asleep throughout the assessment and does not respond to direct questions. Pt's nurse entered the room during the assessment in an attempt to assist with keeping the pt awake. Pt continues falling asleep and does not open his eyes throughout the assessment. Pt does shake his head from left to right to indicate "no" when asked if he is currently suicidal. Pt also denies HI by shaking his head and softly saying "no." EDP note states the pt was experiencing AVH while in triage. Pt does not endorse this during the TTS assessment. Pt does admit to using multiple drugs. Per labs, pt is positive for cocaine, cannabis, and amphetamines. Pt's nurse reports the pt informed her that he left the ED after being d/c on 10/03/17 and met with several friends that gave him drugs.   Pt is followed by [facility]. Pt reported to RN that he is scheduled to obtain housing soon with the assistance of his ACTT team.   Diagnosis: Cocaine Use Disorder; Stimulant Use Disorder; Cannabis Use Disorder; Bipolar I disorder, Current or most recent episode depressed, In partial remission  Past Medical History:  Past Medical History:  Diagnosis Date  . ADHD (attention deficit hyperactivity disorder)   . Anxiety   . Bipolar 1 disorder ([place])   . Current smoker   . Depression   . Eating disorder   . Headache(784.0)   . History of ADHD 11/01/2015  . Medical history non-contributory   . Mental disorder   . Nicotine dependence 11/03/2015  . Psychoactive  substance-induced mood disorder ([place]) 10/28/2015    History reviewed. No pertinent surgical history.  Family History: No family history on file.  Social History:  reports that he has been smoking cigarettes.  He has been smoking about 0.00 packs per day for the past 4.00 years. he has never used smokeless tobacco. He reports that he uses drugs. Drugs: Marijuana, Cocaine, LSD, MDMA (Ecstacy), and Methamphetamines. He reports that he does not drink alcohol.  Additional Social History:  Alcohol / Drug Use Pain Medications: See MAR Prescriptions: See MAR Over the Counter: See MAR History of alcohol / drug use?: Yes Longest period of sobriety (when/how long): Unknown Negative Consequences of Use: Legal, Personal relationships Withdrawal Symptoms: Agitation Substance #1 Name of Substance 1:  Robitussin 1 - Age of First Use: Unknown 1 - Amount (size/oz): Amounts vary 1 - Frequency: unknown 1 - Duration: unknown 1 - Last Use / Amount: 09/24/17, per chart Substance #2 Name of Substance 2: Cocaine 2 - Age of First Use: 20 2 - Amount (size/oz): varies 2 - Frequency: daily 2 - Duration: ongoing 2 - Last Use / Amount: 10/04/17 Substance #3 Name of Substance 3: Cannabis 3 - Age of First Use: unknown 3 - Amount (size/oz): unknown 3 - Frequency: unknown 3 - Duration: ongoing 3 - Last Use / Amount: 10/04/17 Substance #4 Name of Substance 4: amphetamine 4 - Age of First Use: unknown 4 - Amount (size/oz): unknown 4 - Frequency: unknown 4 - Duration:  ongoing 4 - Last Use / Amount: 10/04/17  CIWA: CIWA-Ar BP: 124/83 Pulse Rate: 74 COWS:    Allergies: No Known Allergies  Home Medications:  (Not in a hospital admission)  OB/GYN Status:  No LMP for male patient.  General Assessment Data Location of Assessment: [facility] ED TTS Assessment: In system Is this a Tele or Face-to-Face Assessment?: Face-to-Face Is this an Initial Assessment or a Re-assessment for this encounter?: Initial  Assessment Marital status: Single Is patient pregnant?: No Pregnancy Status: No Living Arrangements: Other (Comment)(homeless) Can pt return to current living arrangement?: Yes Admission Status: Voluntary Is patient capable of signing voluntary admission?: Yes Referral Source: Self/Family/Friend Insurance type: Medicaid     Crisis Care Plan Living Arrangements: Other (Comment)(homeless) Name of Psychiatrist: [facility] Name of Therapist: [facility]  Education Status Is patient currently in school?: No Highest grade of school patient has completed: GED Contact person: self  Risk to self with the past 6 months Suicidal Ideation: No Has patient been a risk to self within the past 6 months prior to admission? : No Suicidal Intent: No Has patient had any suicidal intent within the past 6 months prior to admission? : No Is patient at risk for suicide?: No Suicidal Plan?: No Has patient had any suicidal plan within the past 6 months prior to admission? : Yes Specify Current Suicidal Plan: per chart, pt attempted to purchase heroin with the intent to OD  Access to Means: No What has been your use of drugs/alcohol within the last 12 months?: pt labs positive for cocaine, cannabis, and Amphetamine on arrival to the ED  Previous Attempts/Gestures: Yes How many times?: (UTA) Triggers for Past Attempts: Unpredictable Intentional Self Injurious Behavior: None Family Suicide History: Yes Recent stressful life event(s): Other (Comment)(increased SA ) Persecutory voices/beliefs?: No Depression: No Substance abuse history and/or treatment for substance abuse?: Yes Suicide prevention information given to non-admitted patients: Not applicable  Risk to Others within the past 6 months Homicidal Ideation: No Does patient have any lifetime risk of violence toward others beyond the six months prior to admission? : No Thoughts of Harm to Others: No Current Homicidal Intent: No Current Homicidal  Plan: No Access to Homicidal Means: No History of harm to others?: No Assessment of Violence: None Noted Does patient have access to weapons?: No Criminal Charges Pending?: Yes Describe Pending Criminal Charges: larceny  Does patient have a court date: Yes Court Date: (unknown) Is patient on probation?: No  Psychosis Hallucinations: Auditory, Visual, With command(baseline) Delusions: None noted  Mental Status Report Appearance/Hygiene: In scrubs Eye Contact: Poor Motor Activity: Unsteady Speech: Slow, Incoherent Level of Consciousness: Sleeping, Unresponsive (To pain or command) Mood: Apathetic Affect: Constricted Anxiety Level: None Thought Processes: Unable to Assess Judgement: Impaired Orientation: Unable to assess Obsessive Compulsive Thoughts/Behaviors: None  Cognitive Functioning Concentration: Unable to Assess Memory: Recent Impaired, Remote Impaired IQ: Average Insight: Unable to Assess Impulse Control: Poor Appetite: (UTA) Sleep: Unable to Assess Vegetative Symptoms: Unable to Assess  ADLScreening [facility] Assessment Services) Patient's cognitive ability adequate to safely complete daily activities?: Yes Patient able to express need for assistance with ADLs?: Yes Independently performs ADLs?: Yes (appropriate for developmental age)  Prior Inpatient Therapy Prior Inpatient Therapy: Yes Prior Therapy Dates: 2018, 2017 and mult other admits Prior Therapy Facilty/Provider(s): [facility], [facility], [facility], [facility] Reason for Treatment: MH SA issues  Prior Outpatient Therapy Prior Outpatient Therapy: Yes Prior Therapy Dates: current Prior Therapy Facilty/Provider(s): [facility] Reason for Treatment: Med management  Does  patient have an ACCT team?: Yes Does patient have Intensive In-House Services?  : No Does patient have [facility] services? : No Does patient have P4CC services?: No  ADL Screening (condition at time of admission) Patient's cognitive ability adequate to safely  complete daily activities?: Yes Is the patient deaf or have difficulty hearing?: No Does the patient have difficulty seeing, even when wearing glasses/contacts?: No Does the patient have difficulty concentrating, remembering, or making decisions?: Yes Patient able to express need for assistance with ADLs?: Yes Does the patient have difficulty dressing or bathing?: No Independently performs ADLs?: Yes (appropriate for developmental age) Does the patient have difficulty walking or climbing stairs?: No Weakness of Legs: None Weakness of Arms/Hands: None  Home Assistive Devices/Equipment Home Assistive Devices/Equipment: None    Abuse/Neglect Assessment (Assessment to be complete while patient is alone) Abuse/Neglect Assessment Can Be Completed: Yes(per hx) Physical Abuse: Denies Verbal Abuse: Denies Sexual Abuse: Denies Exploitation of patient/patient's resources: Denies Self-Neglect: Denies     Regulatory affairs officer (For Healthcare) Does Patient Have a Medical Advance Directive?: No Would patient like information on creating a medical advance directive?: No - Patient declined    Additional Information 1:1 In Past 12 Months?: No CIRT Risk: No Elopement Risk: No Does patient have medical clearance?: Yes     Disposition: Per Lindon Romp, NP pt does not meet inpt criteria. Pt has been advised to follow up with his ACTT team, [facility]. TTS will attempt to contact the pt's ACTT team. EDP Muthersbaugh, Gwenlyn Perking and pt's nurse Margaretha Sheffield, RN have been advised of recommendation.    Disposition Initial Assessment Completed for this Encounter: Yes Disposition of Patient: Discharge with Outpatient Resources(PER Lindon Romp, NP)  On Site Evaluation by:   Reviewed with Physician:    Lyanne Co 10/05/2017 1:55 AM
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
## 2017-10-05 NOTE — ED Notes (Signed)
Pt now stating "I see the devil.  He tells me to do things.  I took 1 Adderall.  I'm just tired of being like this.  Everything I do I mess up."

## 2017-10-05 NOTE — ED Notes (Signed)
TTS assessment in progress. 

## 2017-10-05 NOTE — BH Assessment (Signed)
[facility] Assessment Progress Note  Per Thomas ConnJason Berry, NP pt does not meet inpt criteria. Pt has been advised to follow up with his ACTT team, [facility]. TTS will attempt to contact the pt's ACTT team. EDP Thomas [name], Thomas KerbsHannah, PA-C and pt's nurse Thomas LoseElaine, RN have been advised of recommendation.   [name], MSW, LCSW Therapeutic Triage Specialist  [phone]

## 2017-10-11 ENCOUNTER — Emergency Department (HOSPITAL_COMMUNITY): Payer: Medicaid Other

## 2017-10-11 ENCOUNTER — Inpatient Hospital Stay (HOSPITAL_COMMUNITY)
Admission: EM | Admit: 2017-10-11 | Discharge: 2017-10-15 | DRG: 200 | Disposition: A | Discharge status: Home / Self Care | Payer: Medicaid Other | Attending: General Surgery | Admitting: General Surgery

## 2017-10-11 ENCOUNTER — Encounter (HOSPITAL_COMMUNITY): Payer: Self-pay | Admitting: Emergency Medicine

## 2017-10-11 DIAGNOSIS — S27322A Contusion of lung, bilateral, initial encounter: Secondary | ICD-10-CM | POA: Diagnosis present

## 2017-10-11 DIAGNOSIS — F1010 Alcohol abuse, uncomplicated: Secondary | ICD-10-CM | POA: Diagnosis present

## 2017-10-11 DIAGNOSIS — S2221XA Fracture of manubrium, initial encounter for closed fracture: Secondary | ICD-10-CM | POA: Diagnosis present

## 2017-10-11 DIAGNOSIS — S270XXA Traumatic pneumothorax, initial encounter: Principal | ICD-10-CM | POA: Diagnosis present

## 2017-10-11 DIAGNOSIS — S32009A Unspecified fracture of unspecified lumbar vertebra, initial encounter for closed fracture: Secondary | ICD-10-CM | POA: Diagnosis present

## 2017-10-11 DIAGNOSIS — R000 Tachycardia, unspecified: Secondary | ICD-10-CM

## 2017-10-11 DIAGNOSIS — Y900 Blood alcohol level of less than 20 mg/100 ml: Secondary | ICD-10-CM | POA: Diagnosis present

## 2017-10-11 DIAGNOSIS — F1910 Other psychoactive substance abuse, uncomplicated: Secondary | ICD-10-CM

## 2017-10-11 DIAGNOSIS — F209 Schizophrenia, unspecified: Secondary | ICD-10-CM

## 2017-10-11 DIAGNOSIS — S32011A Stable burst fracture of first lumbar vertebra, initial encounter for closed fracture: Secondary | ICD-10-CM | POA: Diagnosis present

## 2017-10-11 DIAGNOSIS — S0081XA Abrasion of other part of head, initial encounter: Secondary | ICD-10-CM | POA: Diagnosis present

## 2017-10-11 DIAGNOSIS — J939 Pneumothorax, unspecified: Secondary | ICD-10-CM

## 2017-10-11 DIAGNOSIS — R402362 Coma scale, best motor response, obeys commands, at arrival to emergency department: Secondary | ICD-10-CM | POA: Diagnosis present

## 2017-10-11 DIAGNOSIS — R52 Pain, unspecified: Secondary | ICD-10-CM

## 2017-10-11 DIAGNOSIS — F319 Bipolar disorder, unspecified: Secondary | ICD-10-CM

## 2017-10-11 DIAGNOSIS — R402142 Coma scale, eyes open, spontaneous, at arrival to emergency department: Secondary | ICD-10-CM | POA: Diagnosis present

## 2017-10-11 DIAGNOSIS — S32000A Wedge compression fracture of unspecified lumbar vertebra, initial encounter for closed fracture: Secondary | ICD-10-CM | POA: Diagnosis present

## 2017-10-11 DIAGNOSIS — S22081A Stable burst fracture of T11-T12 vertebra, initial encounter for closed fracture: Secondary | ICD-10-CM | POA: Diagnosis present

## 2017-10-11 DIAGNOSIS — R402252 Coma scale, best verbal response, oriented, at arrival to emergency department: Secondary | ICD-10-CM | POA: Diagnosis present

## 2017-10-11 HISTORY — DX: Schizophrenia, unspecified: F20.9

## 2017-10-11 LAB — CBC
HEMATOCRIT: 45.4 % (ref 39.0–52.0)
Hemoglobin: 15.6 g/dL (ref 13.0–17.0)
MCH: 31.5 pg (ref 26.0–34.0)
MCHC: 34.4 g/dL (ref 30.0–36.0)
MCV: 91.7 fL (ref 78.0–100.0)
PLATELETS: 215 10*3/uL (ref 150–400)
RBC: 4.95 MIL/uL (ref 4.22–5.81)
RDW: 13.6 % (ref 11.5–15.5)
WBC: 10.9 10*3/uL — ABNORMAL HIGH (ref 4.0–10.5)

## 2017-10-11 LAB — PROTIME-INR
INR: 1.01
Prothrombin Time: 13.2 seconds (ref 11.4–15.2)

## 2017-10-11 LAB — I-STAT CHEM 8, ED
BUN: 11 mg/dL (ref 6–20)
CREATININE: 0.9 mg/dL (ref 0.61–1.24)
Calcium, Ion: 1.23 mmol/L (ref 1.15–1.40)
Chloride: 99 mmol/L — ABNORMAL LOW (ref 101–111)
GLUCOSE: 96 mg/dL (ref 65–99)
HEMATOCRIT: 47 % (ref 39.0–52.0)
HEMOGLOBIN: 16 g/dL (ref 13.0–17.0)
Potassium: 4.1 mmol/L (ref 3.5–5.1)
Sodium: 140 mmol/L (ref 135–145)
TCO2: 27 mmol/L (ref 22–32)

## 2017-10-11 LAB — TYPE AND SCREEN
ABO/RH(D): O POS
ANTIBODY SCREEN: NEGATIVE

## 2017-10-11 LAB — COMPREHENSIVE METABOLIC PANEL
ALBUMIN: 4 g/dL (ref 3.5–5.0)
ALT: 37 U/L (ref 17–63)
AST: 48 U/L — AB (ref 15–41)
Alkaline Phosphatase: 74 U/L (ref 38–126)
Anion gap: 10 (ref 5–15)
BUN: 11 mg/dL (ref 6–20)
CHLORIDE: 102 mmol/L (ref 101–111)
CO2: 26 mmol/L (ref 22–32)
CREATININE: 0.93 mg/dL (ref 0.61–1.24)
Calcium: 9.4 mg/dL (ref 8.9–10.3)
GFR calc Af Amer: >60 mL/min (ref >60)
GLUCOSE: 98 mg/dL (ref 65–99)
POTASSIUM: 4.1 mmol/L (ref 3.5–5.1)
SODIUM: 138 mmol/L (ref 135–145)
Total Bilirubin: 0.7 mg/dL (ref 0.3–1.2)
Total Protein: 7.1 g/dL (ref 6.5–8.1)

## 2017-10-11 LAB — URINALYSIS, ROUTINE W REFLEX MICROSCOPIC
BACTERIA UA: NONE SEEN
BILIRUBIN URINE: NEGATIVE
Glucose, UA: NEGATIVE mg/dL
KETONES UR: NEGATIVE mg/dL
LEUKOCYTES UA: NEGATIVE
NITRITE: NEGATIVE
PH: 6 (ref 5.0–8.0)
Protein, ur: NEGATIVE mg/dL
SPECIFIC GRAVITY, URINE: 1.013 (ref 1.005–1.030)
SQUAMOUS EPITHELIAL / LPF: NONE SEEN

## 2017-10-11 LAB — I-STAT CG4 LACTIC ACID, ED: Lactic Acid, Venous: 1.5 mmol/L (ref 0.5–1.9)

## 2017-10-11 LAB — RAPID URINE DRUG SCREEN, HOSP PERFORMED
Amphetamines: NOT DETECTED
Barbiturates: NOT DETECTED
Benzodiazepines: NOT DETECTED
COCAINE: POSITIVE — AB
OPIATES: NOT DETECTED
Tetrahydrocannabinol: NOT DETECTED

## 2017-10-11 LAB — ETHANOL: Alcohol, Ethyl (B): <10 mg/dL (ref <10)

## 2017-10-11 LAB — ABO/RH: ABO/RH(D): O POS

## 2017-10-11 LAB — CDS SEROLOGY

## 2017-10-11 MED ORDER — HYDROMORPHONE HCL 1 MG/ML IJ SOLN
0.5000 mg | INTRAMUSCULAR | Status: DC | PRN
  Administered 2017-10-11 – 2017-10-13 (×8): 0.5 mg via INTRAVENOUS
  Filled 2017-10-11 (×8): qty 1

## 2017-10-11 MED ORDER — IOPAMIDOL (ISOVUE-300) INJECTION 61%
INTRAVENOUS | Status: AC
  Administered 2017-10-11: 100 mL
  Filled 2017-10-11: qty 100

## 2017-10-11 MED ORDER — ONDANSETRON 4 MG PO TBDP: 4.0000 mg | ORAL_TABLET | Freq: Four times a day (QID) | ORAL | Status: DC | PRN

## 2017-10-11 MED ORDER — TETANUS-DIPHTH-ACELL PERTUSSIS 5-2.5-18.5 LF-MCG/0.5 IM SUSP: 0.5000 mL | Freq: Once | INTRAMUSCULAR | Status: DC

## 2017-10-11 MED ORDER — ENOXAPARIN SODIUM 40 MG/0.4ML ~~LOC~~ SOLN
40.0000 mg | SUBCUTANEOUS | Status: DC
  Administered 2017-10-11 – 2017-10-14 (×4): 40 mg via SUBCUTANEOUS
  Filled 2017-10-11 (×4): qty 0.4

## 2017-10-11 MED ORDER — ONDANSETRON HCL 4 MG/2ML IJ SOLN
4.0000 mg | Freq: Once | INTRAMUSCULAR | Status: AC
  Administered 2017-10-11: 4 mg via INTRAVENOUS
  Filled 2017-10-11: qty 2

## 2017-10-11 MED ORDER — FENTANYL CITRATE (PF) 100 MCG/2ML IJ SOLN
25.0000 ug | Freq: Once | INTRAMUSCULAR | Status: AC
  Administered 2017-10-11: 25 ug via INTRAVENOUS
  Filled 2017-10-11: qty 2

## 2017-10-11 MED ORDER — BUPROPION HCL ER (XL) 150 MG PO TB24
150.0000 mg | ORAL_TABLET | Freq: Every day | ORAL | Status: DC
  Administered 2017-10-11 – 2017-10-14 (×4): 150 mg via ORAL
  Filled 2017-10-11 (×4): qty 1

## 2017-10-11 MED ORDER — TETANUS-DIPHTH-ACELL PERTUSSIS 5-2.5-18.5 LF-MCG/0.5 IM SUSP
0.5000 mL | Freq: Once | INTRAMUSCULAR | Status: AC
  Administered 2017-10-11: 0.5 mL via INTRAMUSCULAR
  Filled 2017-10-11: qty 0.5

## 2017-10-11 MED ORDER — SODIUM CHLORIDE 0.9 % IV SOLN
Freq: Once | INTRAVENOUS | Status: AC
  Administered 2017-10-11: 15:00:00 via INTRAVENOUS

## 2017-10-11 MED ORDER — METHYLPHENIDATE HCL 5 MG PO TABS
10.0000 mg | ORAL_TABLET | Freq: Two times a day (BID) | ORAL | Status: DC
  Administered 2017-10-11 – 2017-10-12 (×2): 10 mg via ORAL
  Filled 2017-10-11 (×2): qty 2

## 2017-10-11 MED ORDER — ONDANSETRON HCL 4 MG/2ML IJ SOLN: 4.0000 mg | Freq: Four times a day (QID) | INTRAMUSCULAR | Status: DC | PRN

## 2017-10-11 MED ORDER — SODIUM CHLORIDE 0.9 % IV SOLN: INTRAVENOUS | Status: DC
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
## 2017-10-11 NOTE — ED Notes (Signed)
gpd at bedside speaking with patient

## 2017-10-11 NOTE — Plan of Care (Signed)
Pain management therapies explained to patient,  he verbalized understanding.

## 2017-10-11 NOTE — ED Notes (Signed)
Family at bedside. 

## 2017-10-11 NOTE — ED Notes (Signed)
Pt provided sandwich coke and graham crackers
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
## 2017-10-11 NOTE — ED Notes (Signed)
Patient transported to CT 

## 2017-10-11 NOTE — ED Notes (Signed)
Lab called stating PT/INR sample was hemolyzed.
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
## 2017-10-11 NOTE — Progress Notes (Signed)
Orthopedic Tech Progress Note Patient Details:  Thomas HeapCaleb D [name] 06/10/1996 [id number]  Patient ID: Thomas [name], male   DOB: 09/13/1996, 21 y.o.   MRN: [id number]   Thomas [name], Thomas [name] 10/11/2017, 2:33 PM Made level 2 trauma visit

## 2017-10-11 NOTE — ED Notes (Signed)
Dr. Lindie SpruceWyatt advised pt may eat or drink at this time, pts mother at bedside and Dr. Lindie SpruceWyatt in to discuss results with pt and mother.
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
## 2017-10-12 ENCOUNTER — Other Ambulatory Visit: Payer: Self-pay

## 2017-10-12 ENCOUNTER — Encounter (HOSPITAL_COMMUNITY): Payer: Self-pay | Admitting: *Deleted

## 2017-10-12 ENCOUNTER — Observation Stay (HOSPITAL_COMMUNITY): Payer: Medicaid Other

## 2017-10-12 DIAGNOSIS — S270XXD Traumatic pneumothorax, subsequent encounter: Secondary | ICD-10-CM | POA: Diagnosis not present

## 2017-10-12 DIAGNOSIS — F1910 Other psychoactive substance abuse, uncomplicated: Secondary | ICD-10-CM

## 2017-10-12 DIAGNOSIS — F209 Schizophrenia, unspecified: Secondary | ICD-10-CM | POA: Diagnosis present

## 2017-10-12 DIAGNOSIS — R079 Chest pain, unspecified: Secondary | ICD-10-CM | POA: Diagnosis not present

## 2017-10-12 DIAGNOSIS — S27322A Contusion of lung, bilateral, initial encounter: Secondary | ICD-10-CM | POA: Diagnosis present

## 2017-10-12 DIAGNOSIS — S0081XA Abrasion of other part of head, initial encounter: Secondary | ICD-10-CM | POA: Diagnosis present

## 2017-10-12 DIAGNOSIS — S32011A Stable burst fracture of first lumbar vertebra, initial encounter for closed fracture: Secondary | ICD-10-CM | POA: Diagnosis present

## 2017-10-12 DIAGNOSIS — R000 Tachycardia, unspecified: Secondary | ICD-10-CM

## 2017-10-12 DIAGNOSIS — F1010 Alcohol abuse, uncomplicated: Secondary | ICD-10-CM | POA: Diagnosis present

## 2017-10-12 DIAGNOSIS — Y900 Blood alcohol level of less than 20 mg/100 ml: Secondary | ICD-10-CM | POA: Diagnosis present

## 2017-10-12 DIAGNOSIS — S32009D Unspecified fracture of unspecified lumbar vertebra, subsequent encounter for fracture with routine healing: Secondary | ICD-10-CM

## 2017-10-12 DIAGNOSIS — F319 Bipolar disorder, unspecified: Secondary | ICD-10-CM | POA: Diagnosis present

## 2017-10-12 DIAGNOSIS — J939 Pneumothorax, unspecified: Secondary | ICD-10-CM

## 2017-10-12 DIAGNOSIS — R52 Pain, unspecified: Secondary | ICD-10-CM

## 2017-10-12 DIAGNOSIS — R402362 Coma scale, best motor response, obeys commands, at arrival to emergency department: Secondary | ICD-10-CM | POA: Diagnosis present

## 2017-10-12 DIAGNOSIS — S2221XA Fracture of manubrium, initial encounter for closed fracture: Secondary | ICD-10-CM | POA: Diagnosis present

## 2017-10-12 DIAGNOSIS — R402252 Coma scale, best verbal response, oriented, at arrival to emergency department: Secondary | ICD-10-CM | POA: Diagnosis present

## 2017-10-12 DIAGNOSIS — S32000A Wedge compression fracture of unspecified lumbar vertebra, initial encounter for closed fracture: Secondary | ICD-10-CM | POA: Diagnosis present

## 2017-10-12 DIAGNOSIS — S22081A Stable burst fracture of T11-T12 vertebra, initial encounter for closed fracture: Secondary | ICD-10-CM | POA: Diagnosis present

## 2017-10-12 DIAGNOSIS — R402142 Coma scale, eyes open, spontaneous, at arrival to emergency department: Secondary | ICD-10-CM | POA: Diagnosis present

## 2017-10-12 DIAGNOSIS — S270XXA Traumatic pneumothorax, initial encounter: Secondary | ICD-10-CM | POA: Diagnosis not present

## 2017-10-12 LAB — CBC
HEMATOCRIT: 43 % (ref 39.0–52.0)
HEMOGLOBIN: 14.6 g/dL (ref 13.0–17.0)
MCH: 31.5 pg (ref 26.0–34.0)
MCHC: 34 g/dL (ref 30.0–36.0)
MCV: 92.7 fL (ref 78.0–100.0)
Platelets: 186 10*3/uL (ref 150–400)
RBC: 4.64 MIL/uL (ref 4.22–5.81)
RDW: 13.5 % (ref 11.5–15.5)
WBC: 9.7 10*3/uL (ref 4.0–10.5)

## 2017-10-12 LAB — HIV ANTIBODY (ROUTINE TESTING W REFLEX): HIV Screen 4th Generation wRfx: NONREACTIVE

## 2017-10-12 MED ORDER — CYCLOBENZAPRINE HCL 5 MG PO TABS
5.0000 mg | ORAL_TABLET | Freq: Three times a day (TID) | ORAL | Status: DC | PRN
  Administered 2017-10-12 (×2): 5 mg via ORAL
  Filled 2017-10-12 (×2): qty 1

## 2017-10-12 MED ORDER — FOLIC ACID 1 MG PO TABS
1.0000 mg | ORAL_TABLET | Freq: Every day | ORAL | Status: DC
  Administered 2017-10-12 – 2017-10-15 (×4): 1 mg via ORAL
  Filled 2017-10-12 (×4): qty 1

## 2017-10-12 MED ORDER — OXYCODONE HCL 5 MG PO TABS
5.0000 mg | ORAL_TABLET | ORAL | Status: DC | PRN
  Administered 2017-10-12 – 2017-10-15 (×10): 5 mg via ORAL
  Filled 2017-10-12 (×10): qty 1

## 2017-10-12 MED ORDER — METHYLPHENIDATE HCL 5 MG PO TABS
10.0000 mg | ORAL_TABLET | Freq: Two times a day (BID) | ORAL | Status: DC
  Administered 2017-10-12 – 2017-10-15 (×6): 10 mg via ORAL
  Filled 2017-10-12 (×6): qty 2

## 2017-10-12 MED ORDER — ACETAMINOPHEN 500 MG PO TABS
1000.0000 mg | ORAL_TABLET | Freq: Three times a day (TID) | ORAL | Status: DC
  Administered 2017-10-12 – 2017-10-14 (×7): 1000 mg via ORAL
  Filled 2017-10-12 (×8): qty 2

## 2017-10-12 MED ORDER — THIAMINE HCL 100 MG/ML IJ SOLN
100.0000 mg | Freq: Every day | INTRAMUSCULAR | Status: DC
  Filled 2017-10-12 (×2): qty 2

## 2017-10-12 MED ORDER — INFLUENZA VAC SPLIT QUAD 0.5 ML IM SUSY
0.5000 mL | PREFILLED_SYRINGE | INTRAMUSCULAR | Status: DC
  Filled 2017-10-12: qty 0.5

## 2017-10-12 MED ORDER — LORAZEPAM 1 MG PO TABS
1.0000 mg | ORAL_TABLET | Freq: Four times a day (QID) | ORAL | Status: DC | PRN
  Administered 2017-10-13 – 2017-10-14 (×2): 1 mg via ORAL
  Filled 2017-10-12 (×2): qty 1

## 2017-10-12 MED ORDER — VITAMIN B-1 100 MG PO TABS
100.0000 mg | ORAL_TABLET | Freq: Every day | ORAL | Status: DC
  Administered 2017-10-12 – 2017-10-15 (×4): 100 mg via ORAL
  Filled 2017-10-12 (×4): qty 1

## 2017-10-12 MED ORDER — ADULT MULTIVITAMIN W/MINERALS CH
1.0000 | ORAL_TABLET | Freq: Every day | ORAL | Status: DC
  Administered 2017-10-12 – 2017-10-15 (×4): 1 via ORAL
  Filled 2017-10-12 (×4): qty 1

## 2017-10-12 MED ORDER — LORAZEPAM 2 MG/ML IJ SOLN: 1.0000 mg | Freq: Four times a day (QID) | INTRAMUSCULAR | Status: DC | PRN
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
## 2017-10-12 NOTE — Progress Notes (Signed)
   10/12/17 1600  Clinical Encounter Type  Visited With Patient  Visit Type Follow-up  Referral From Nurse  Consult/Referral To Chaplain  Stress Factors  Patient Stress Factors Major life changes  Chaplain visited with the PT per the request of the nurse.  The PT wanted to talk about the [facility] and making music.  PT was given crayons, a coloring book and a Bible to read the word of God and write music and color in the coloring book

## 2017-10-13 MED ORDER — OXYCODONE HCL 5 MG PO TABS: 5.0000 mg | ORAL_TABLET | ORAL | 0 refills | Status: DC | PRN
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
## 2017-10-13 NOTE — Progress Notes (Signed)
CSW notified by RN that pt has nowhere to go. Patient pacing around the hallway asking for pain medicine and stating that his mom and ACTT team are not able to find him a place until Monday and that the MD says he does not have to discharge until then. CSW placed homeless resources and a bus pass on patient's chart just in case.   Please re-consult CSW if need arises.  CSW signing off.  Osborne Cascoadia Braya Habermehl LCSWA [phone]
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
## 2017-10-14 NOTE — Clinical Social Work Note (Signed)
CSW met with patient. RN at bedside. Discussed discharge plan. Patient states he cannot go to his mother's house today because she has to get "some stuff in order" but says he can discharge there tomorrow. CSW asked if she was aware of this and he replied that she is. Patient does not want a shelter list. He stated if he discharged today, he would go somewhere near her home and wait for her. Patient would not give CSW permission to contact his mother. Patient states that he is planning on getting a place to live in two weeks but the plans are not concrete. Patient also asking CSW to assist him in getting a Ritalin prescription because his was lost. He had filled it after going to [facility] attempted calling patient's mother since patient would not give CSW permission. No answer/did not leave a voicemail.  Thomas [name], [place]
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
## 2017-10-14 NOTE — Progress Notes (Addendum)
1130 Patient's mother present at bedside and states it is not safe for patient to be discharged home. She states he can not manage himself. She is concerned he will take all of his medications which could be harmful. It is her preference for Larone to be discharged to an inpatient drug rehab facility. She states he has never completed a drug rehab program, as he is either kicked out or leaves voluntarily after a couple of days. If a drug rehab is not an option, she is willing to take [name] but will need until 12/31 to accommodate him at home.   Zola ButtonWill Jennings, PA notified Dr. Lindie SpruceWyatt, which recommended a shelter placement. Social work consulted.   1257 Spoke with Huntley DecSara, Child psychotherapistsocial worker. States she will contact a few resources for placement.   1345 spoke with Michaell Cowingura (Patient's mother- [phone]) to let her know social work is looking into available resources for placement.

## 2017-10-14 NOTE — Progress Notes (Signed)
The patient continues to say that he can go home with his mother, but she will not take him home.  She has told me this directly.  The reason that he does not want us to contact her is because she will not confirm that he can come to her home.  He is playing the system right now.  He has no medical issues for which he needs to stay in the hospital for care.  [name], [name], MD, FACS [phone] Trauma Surgeon

## 2017-10-15 ENCOUNTER — Encounter (HOSPITAL_COMMUNITY): Payer: Self-pay | Admitting: Emergency Medicine
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
## 2017-10-15 NOTE — Progress Notes (Signed)
Discharge instructions reviewed with pt and pt's mother and instructed on where to pick up prescription.  Pt and pt's mother verbalized understanding and had no questions.  Pt discharged in stable condition with mother.  Thomas [name], [name]

## 2017-11-17 ENCOUNTER — Emergency Department (HOSPITAL_COMMUNITY)
Admission: EM | Admit: 2017-11-17 | Discharge: 2017-11-17 | Disposition: A | Discharge status: Home / Self Care | Payer: Medicaid Other | Attending: Emergency Medicine | Admitting: Emergency Medicine

## 2017-11-17 ENCOUNTER — Encounter (HOSPITAL_COMMUNITY): Payer: Self-pay | Admitting: Nurse Practitioner

## 2017-11-17 DIAGNOSIS — F329 Major depressive disorder, single episode, unspecified: Secondary | ICD-10-CM | POA: Insufficient documentation

## 2017-11-17 DIAGNOSIS — F17210 Nicotine dependence, cigarettes, uncomplicated: Secondary | ICD-10-CM | POA: Insufficient documentation

## 2017-11-17 DIAGNOSIS — F419 Anxiety disorder, unspecified: Secondary | ICD-10-CM | POA: Diagnosis not present

## 2017-11-17 DIAGNOSIS — F319 Bipolar disorder, unspecified: Secondary | ICD-10-CM | POA: Diagnosis not present

## 2017-11-17 DIAGNOSIS — R440 Auditory hallucinations: Secondary | ICD-10-CM | POA: Diagnosis present

## 2017-11-17 DIAGNOSIS — R443 Hallucinations, unspecified: Secondary | ICD-10-CM

## 2017-11-17 MED ORDER — LITHIUM CARBONATE 300 MG PO TABS: 300.0000 mg | ORAL_TABLET | Freq: Two times a day (BID) | ORAL | 0 refills | Status: DC

## 2017-11-17 MED ORDER — BUPROPION HCL ER (XL) 150 MG PO TB24: 150.0000 mg | ORAL_TABLET | Freq: Every day | ORAL | 0 refills | Status: DC
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
## 2017-11-17 NOTE — ED Triage Notes (Signed)
Pt states he too 5-HTP a supplement that drug that he should not be taking that made him hallucinate while he was [employer] grocery [place] bathroom. He says he "is all good now but wants to be checked out."

## 2017-11-17 NOTE — Discharge Instructions (Signed)
Stop taking 5 HTP.   Follow-up with [facility]. Call their office to get your medications. Crisis Hotline, available 24 hours a day, 7 days a week: [phone] or [phone]  Return to the emergency department for any more hallucinations, or thoughts of wanting her to kill yourself, thoughts of wanting her to kill anybody else, chest pain, difficulty breathing, fevers or any other worsening or concerning symptoms.

## 2017-11-17 NOTE — ED Notes (Signed)
Bed: WA07 Expected date:  Expected time:  Means of arrival:  Comments: Ems 1587m hallunications

## 2017-12-07 ENCOUNTER — Emergency Department (HOSPITAL_COMMUNITY)
Admission: EM | Admit: 2017-12-07 | Discharge: 2017-12-08 | Disposition: A | Discharge status: Home / Self Care | Payer: Medicaid Other | Attending: Emergency Medicine | Admitting: Emergency Medicine

## 2017-12-07 ENCOUNTER — Encounter (HOSPITAL_COMMUNITY): Payer: Self-pay

## 2017-12-07 ENCOUNTER — Other Ambulatory Visit: Payer: Self-pay

## 2017-12-07 DIAGNOSIS — F17210 Nicotine dependence, cigarettes, uncomplicated: Secondary | ICD-10-CM | POA: Diagnosis not present

## 2017-12-07 DIAGNOSIS — F1910 Other psychoactive substance abuse, uncomplicated: Secondary | ICD-10-CM | POA: Diagnosis not present

## 2017-12-07 DIAGNOSIS — R440 Auditory hallucinations: Secondary | ICD-10-CM | POA: Diagnosis not present

## 2017-12-07 DIAGNOSIS — F14180 Cocaine abuse with cocaine-induced anxiety disorder: Secondary | ICD-10-CM | POA: Diagnosis not present

## 2017-12-07 DIAGNOSIS — R45851 Suicidal ideations: Secondary | ICD-10-CM | POA: Insufficient documentation

## 2017-12-07 DIAGNOSIS — F329 Major depressive disorder, single episode, unspecified: Secondary | ICD-10-CM | POA: Diagnosis present

## 2017-12-07 DIAGNOSIS — F1290 Cannabis use, unspecified, uncomplicated: Secondary | ICD-10-CM | POA: Diagnosis not present

## 2017-12-07 LAB — CBC
HEMATOCRIT: 38.4 % — AB (ref 39.0–52.0)
HEMOGLOBIN: 13.7 g/dL (ref 13.0–17.0)
MCH: 31.4 pg (ref 26.0–34.0)
MCHC: 35.7 g/dL (ref 30.0–36.0)
MCV: 88.1 fL (ref 78.0–100.0)
Platelets: 246 10*3/uL (ref 150–400)
RBC: 4.36 MIL/uL (ref 4.22–5.81)
RDW: 12.7 % (ref 11.5–15.5)
WBC: 10.8 10*3/uL — AB (ref 4.0–10.5)

## 2017-12-07 LAB — RAPID URINE DRUG SCREEN, HOSP PERFORMED
Amphetamines: POSITIVE — AB
BARBITURATES: NOT DETECTED
BENZODIAZEPINES: NOT DETECTED
Cocaine: POSITIVE — AB
Opiates: NOT DETECTED
TETRAHYDROCANNABINOL: NOT DETECTED

## 2017-12-07 LAB — ETHANOL: Alcohol, Ethyl (B): <10 mg/dL (ref <10)

## 2017-12-07 LAB — COMPREHENSIVE METABOLIC PANEL
ALBUMIN: 4.4 g/dL (ref 3.5–5.0)
ALK PHOS: 75 U/L (ref 38–126)
ALT: 22 U/L (ref 17–63)
AST: 20 U/L (ref 15–41)
Anion gap: 9 (ref 5–15)
BUN: 10 mg/dL (ref 6–20)
CALCIUM: 9.4 mg/dL (ref 8.9–10.3)
CO2: 24 mmol/L (ref 22–32)
Chloride: 104 mmol/L (ref 101–111)
Creatinine, Ser: 0.92 mg/dL (ref 0.61–1.24)
GFR calc Af Amer: >60 mL/min (ref >60)
GFR calc non Af Amer: >60 mL/min (ref >60)
GLUCOSE: 88 mg/dL (ref 65–99)
Potassium: 4.1 mmol/L (ref 3.5–5.1)
Sodium: 137 mmol/L (ref 135–145)
TOTAL PROTEIN: 7 g/dL (ref 6.5–8.1)
Total Bilirubin: 0.5 mg/dL (ref 0.3–1.2)

## 2017-12-07 LAB — ACETAMINOPHEN LEVEL: Acetaminophen (Tylenol), Serum: <10 ug/mL — ABNORMAL LOW (ref 10–30)

## 2017-12-07 LAB — SALICYLATE LEVEL: Salicylate Lvl: <7 mg/dL (ref 2.8–30.0)

## 2017-12-07 LAB — LITHIUM LEVEL: LITHIUM LVL: 0.52 mmol/L — AB (ref 0.60–1.20)

## 2017-12-07 NOTE — ED Notes (Signed)
[facility] date:  Expected time:  Means of arrival:  Comments: [facility]

## 2017-12-07 NOTE — ED Notes (Signed)
Bed: [facility]ALD Expected date:  Expected time:  Means of arrival:  Comments: EMS SI 

## 2017-12-07 NOTE — ED Notes (Signed)
Pt has been scrubbed out and all personal belongings are in 3 bags at the nurses desk.

## 2017-12-07 NOTE — ED Triage Notes (Signed)
Patient arrives by EMS with complaints of SI for the past 3 weeks. Patient has been abusing cough syrup and crack cocaine.

## 2017-12-07 NOTE — ED Notes (Signed)
Patient with TTS  
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
## 2017-12-08 ENCOUNTER — Encounter (HOSPITAL_COMMUNITY): Payer: Self-pay | Admitting: Emergency Medicine

## 2017-12-08 ENCOUNTER — Other Ambulatory Visit: Payer: Self-pay

## 2017-12-08 DIAGNOSIS — R45851 Suicidal ideations: Secondary | ICD-10-CM | POA: Diagnosis not present

## 2017-12-08 DIAGNOSIS — F1990 Other psychoactive substance use, unspecified, uncomplicated: Secondary | ICD-10-CM

## 2017-12-08 DIAGNOSIS — F17210 Nicotine dependence, cigarettes, uncomplicated: Secondary | ICD-10-CM | POA: Diagnosis not present

## 2017-12-08 DIAGNOSIS — F14180 Cocaine abuse with cocaine-induced anxiety disorder: Secondary | ICD-10-CM

## 2017-12-08 DIAGNOSIS — F1290 Cannabis use, unspecified, uncomplicated: Secondary | ICD-10-CM | POA: Diagnosis not present

## 2017-12-08 LAB — COMPREHENSIVE METABOLIC PANEL
ALBUMIN: 4.3 g/dL (ref 3.5–5.0)
ALK PHOS: 79 U/L (ref 38–126)
ALT: 21 U/L (ref 17–63)
AST: 24 U/L (ref 15–41)
Anion gap: 11 (ref 5–15)
BILIRUBIN TOTAL: 0.5 mg/dL (ref 0.3–1.2)
BUN: 8 mg/dL (ref 6–20)
CO2: 25 mmol/L (ref 22–32)
Calcium: 9.5 mg/dL (ref 8.9–10.3)
Chloride: 104 mmol/L (ref 101–111)
Creatinine, Ser: 1.01 mg/dL (ref 0.61–1.24)
GFR calc Af Amer: >60 mL/min (ref >60)
GFR calc non Af Amer: >60 mL/min (ref >60)
GLUCOSE: 91 mg/dL (ref 65–99)
POTASSIUM: 3.9 mmol/L (ref 3.5–5.1)
Sodium: 140 mmol/L (ref 135–145)
TOTAL PROTEIN: 7.3 g/dL (ref 6.5–8.1)

## 2017-12-08 LAB — CBC
HCT: 39.5 % (ref 39.0–52.0)
Hemoglobin: 14.1 g/dL (ref 13.0–17.0)
MCH: 31.4 pg (ref 26.0–34.0)
MCHC: 35.7 g/dL (ref 30.0–36.0)
MCV: 88 fL (ref 78.0–100.0)
PLATELETS: 256 10*3/uL (ref 150–400)
RBC: 4.49 MIL/uL (ref 4.22–5.81)
RDW: 12.7 % (ref 11.5–15.5)
WBC: 8.9 10*3/uL (ref 4.0–10.5)

## 2017-12-08 LAB — RAPID URINE DRUG SCREEN, HOSP PERFORMED
Amphetamines: POSITIVE — AB
BENZODIAZEPINES: NOT DETECTED
Barbiturates: NOT DETECTED
COCAINE: POSITIVE — AB
OPIATES: NOT DETECTED
Tetrahydrocannabinol: NOT DETECTED

## 2017-12-08 LAB — ETHANOL: Alcohol, Ethyl (B): <10 mg/dL (ref <10)
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
## 2017-12-08 NOTE — Progress Notes (Signed)
Pt [facility] by [facility]. [facility]: [facility], [facility], [facility], [facility], [facility], [facility], [place], [place]. Garner NashGregory Leana [place], MSW, LCSW Clinical Social Worker 12/08/2017 9:54 AM
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
## 2017-12-08 NOTE — ED Notes (Signed)
Pt discharged safely.  Pt denied S/I and H/I.  Pt was in no distress.  All belongings were returned to patient.  Pt refused AVS.  Pt refused discharge vitals.

## 2017-12-08 NOTE — ED Triage Notes (Signed)
Reports wanting detox from delsym cough syrup.  States that [facility] said "they would accept me if you can detox me."  Denies having any SI at this time.  Reports last use was today 3-4 hours pta.

## 2017-12-09 ENCOUNTER — Encounter (HOSPITAL_COMMUNITY): Payer: Self-pay | Admitting: Nurse Practitioner

## 2017-12-09 ENCOUNTER — Emergency Department (HOSPITAL_COMMUNITY)
Admission: EM | Admit: 2017-12-09 | Discharge: 2017-12-09 | Disposition: A | Discharge status: Home / Self Care | Payer: Medicaid Other | Attending: Emergency Medicine | Admitting: Emergency Medicine

## 2017-12-09 ENCOUNTER — Emergency Department (HOSPITAL_COMMUNITY)
Admission: EM | Admit: 2017-12-09 | Discharge: 2017-12-09 | Disposition: A | Discharge status: Home / Self Care | Payer: Medicaid Other | Source: Home / Self Care | Attending: Emergency Medicine | Admitting: Emergency Medicine

## 2017-12-09 DIAGNOSIS — F329 Major depressive disorder, single episode, unspecified: Secondary | ICD-10-CM | POA: Insufficient documentation

## 2017-12-09 DIAGNOSIS — Z79899 Other long term (current) drug therapy: Secondary | ICD-10-CM | POA: Insufficient documentation

## 2017-12-09 DIAGNOSIS — R443 Hallucinations, unspecified: Secondary | ICD-10-CM | POA: Diagnosis not present

## 2017-12-09 DIAGNOSIS — F17210 Nicotine dependence, cigarettes, uncomplicated: Secondary | ICD-10-CM | POA: Insufficient documentation

## 2017-12-09 DIAGNOSIS — Z760 Encounter for issue of repeat prescription: Secondary | ICD-10-CM | POA: Insufficient documentation

## 2017-12-09 DIAGNOSIS — F1910 Other psychoactive substance abuse, uncomplicated: Secondary | ICD-10-CM

## 2017-12-09 NOTE — ED Notes (Signed)
Patient is A&Ox4.  No signs of distress noted.  Please see providers complete history and physical exam.  

## 2017-12-09 NOTE — Discharge Instructions (Signed)
1. Medications: usual home medications °2. Treatment: rest, drink plenty of fluids,  °3. Follow Up: Please followup with your primary doctor in 2-3 days for discussion of your diagnoses and further evaluation after today's visit; if you do not have a primary care doctor use the resource guide provided to find one; Please return to the ER for worsening symptoms ° °

## 2017-12-09 NOTE — ED Notes (Signed)
Pt up to [facility] stating he now has SI. Pt brought back to triage to be reassessed.

## 2017-12-09 NOTE — ED Triage Notes (Signed)
Pt states he would like to be put on his psychiatric medications. [facility], states he "has no where to go and would also like to be placed somewhere." Denies SI/HI.
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
## 2017-12-09 NOTE — ED Notes (Signed)
Food and drink given per request. 

## 2017-12-09 NOTE — ED Notes (Signed)
[facility] and [facility], pt returned to lobby
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
## 2017-12-10 ENCOUNTER — Encounter (HOSPITAL_COMMUNITY): Payer: Self-pay

## 2017-12-10 ENCOUNTER — Emergency Department (HOSPITAL_COMMUNITY)
Admission: EM | Admit: 2017-12-10 | Discharge: 2017-12-11 | Disposition: A | Discharge status: Home / Self Care | Payer: Medicaid Other | Attending: Emergency Medicine | Admitting: Emergency Medicine

## 2017-12-10 DIAGNOSIS — T484X4A Poisoning by expectorants, undetermined, initial encounter: Secondary | ICD-10-CM | POA: Diagnosis not present

## 2017-12-10 DIAGNOSIS — F19959 Other psychoactive substance use, unspecified with psychoactive substance-induced psychotic disorder, unspecified: Secondary | ICD-10-CM | POA: Diagnosis not present

## 2017-12-10 DIAGNOSIS — F1920 Other psychoactive substance dependence, uncomplicated: Secondary | ICD-10-CM | POA: Insufficient documentation

## 2017-12-10 DIAGNOSIS — F1994 Other psychoactive substance use, unspecified with psychoactive substance-induced mood disorder: Secondary | ICD-10-CM | POA: Diagnosis not present

## 2017-12-10 DIAGNOSIS — F17210 Nicotine dependence, cigarettes, uncomplicated: Secondary | ICD-10-CM | POA: Insufficient documentation

## 2017-12-10 DIAGNOSIS — F319 Bipolar disorder, unspecified: Secondary | ICD-10-CM | POA: Insufficient documentation

## 2017-12-10 DIAGNOSIS — T405X4A Poisoning by cocaine, undetermined, initial encounter: Secondary | ICD-10-CM | POA: Diagnosis not present

## 2017-12-10 DIAGNOSIS — R51 Headache: Secondary | ICD-10-CM | POA: Diagnosis present

## 2017-12-10 LAB — COMPREHENSIVE METABOLIC PANEL
ALT: 20 U/L (ref 17–63)
AST: 20 U/L (ref 15–41)
Albumin: 4.3 g/dL (ref 3.5–5.0)
Alkaline Phosphatase: 74 U/L (ref 38–126)
Anion gap: 10 (ref 5–15)
BUN: 10 mg/dL (ref 6–20)
CO2: 22 mmol/L (ref 22–32)
Calcium: 9.3 mg/dL (ref 8.9–10.3)
Chloride: 107 mmol/L (ref 101–111)
Creatinine, Ser: 0.84 mg/dL (ref 0.61–1.24)
GFR calc Af Amer: >60 mL/min (ref >60)
GFR calc non Af Amer: >60 mL/min (ref >60)
Glucose, Bld: 85 mg/dL (ref 65–99)
Potassium: 3.6 mmol/L (ref 3.5–5.1)
Sodium: 139 mmol/L (ref 135–145)
Total Bilirubin: 1 mg/dL (ref 0.3–1.2)
Total Protein: 7.1 g/dL (ref 6.5–8.1)

## 2017-12-10 LAB — RAPID URINE DRUG SCREEN, HOSP PERFORMED
Amphetamines: POSITIVE — AB
Barbiturates: NOT DETECTED
Benzodiazepines: NOT DETECTED
Cocaine: POSITIVE — AB
Opiates: NOT DETECTED
Tetrahydrocannabinol: NOT DETECTED

## 2017-12-10 LAB — CBC WITH DIFFERENTIAL/PLATELET
Basophils Absolute: 0 10*3/uL (ref 0.0–0.1)
Basophils Relative: 0 %
Eosinophils Absolute: 0.3 10*3/uL (ref 0.0–0.7)
Eosinophils Relative: 3 %
HCT: 40.6 % (ref 39.0–52.0)
Hemoglobin: 14.5 g/dL (ref 13.0–17.0)
Lymphocytes Relative: 26 %
Lymphs Abs: 2.3 10*3/uL (ref 0.7–4.0)
MCH: 31.6 pg (ref 26.0–34.0)
MCHC: 35.7 g/dL (ref 30.0–36.0)
MCV: 88.5 fL (ref 78.0–100.0)
Monocytes Absolute: 0.8 10*3/uL (ref 0.1–1.0)
Monocytes Relative: 9 %
Neutro Abs: 5.6 10*3/uL (ref 1.7–7.7)
Neutrophils Relative %: 62 %
Platelets: 250 10*3/uL (ref 150–400)
RBC: 4.59 MIL/uL (ref 4.22–5.81)
RDW: 13 % (ref 11.5–15.5)
WBC: 9 10*3/uL (ref 4.0–10.5)

## 2017-12-10 LAB — ACETAMINOPHEN LEVEL: Acetaminophen (Tylenol), Serum: <10 ug/mL — ABNORMAL LOW (ref 10–30)

## 2017-12-10 LAB — ETHANOL: Alcohol, Ethyl (B): <10 mg/dL (ref <10)

## 2017-12-10 LAB — SALICYLATE LEVEL: Salicylate Lvl: <7 mg/dL (ref 2.8–30.0)

## 2017-12-10 MED ORDER — HYDROMORPHONE HCL 1 MG/ML IJ SOLN: 1.0000 mg | Freq: Once | INTRAMUSCULAR | Status: DC

## 2017-12-10 NOTE — BH Assessment (Signed)
TTS talked to RN at [phone], nurse to call counselor once pt is in a room  Thomas BootsValarie Tranquilino Fischler, MS, [facility] Therapeutic Triage Specialist

## 2017-12-10 NOTE — ED Notes (Signed)
Pt placed in scrubs, jewelry removed and patient belongings placed in cabinets labeled "resus A/ 16-18"

## 2017-12-10 NOTE — ED Notes (Signed)
Verbal order given by Alvira MondayErin Schlossman, MD to complete EKG.

## 2017-12-10 NOTE — ED Triage Notes (Signed)
Patient BIB EMS per call from PD. States he took "3815- 15mg  pills of Robitussin and $15 worth of cocaine, and a 24oz beer". Patient complains of headache and states he "just wanted to sleep".

## 2017-12-10 NOTE — ED Notes (Signed)
SBAR Report received from previous nurse. Pt received calm and visible on unit. Pt denies current SI/ HI, A/V H, depression, anxiety, or pain at this time, and appears otherwise stable and free of distress. Pt reminded of camera surveillance, q 15 min rounds, and rules of the milieu. Will continue to assess. 

## 2017-12-10 NOTE — ED Notes (Signed)
Vital signs stable, continuous cardiac monitoring. Patient sleeping.

## 2017-12-10 NOTE — ED Notes (Signed)
[facility] Expected date: 12/10/17 Expected time:  Means of arrival:  Comments: [facility]er PickerelHall D
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
## 2017-12-10 NOTE — ED Notes (Signed)
[facility] Expected date:  Expected time:  Means of arrival:  Comments: EMS cough syrup/cocaine

## 2017-12-11 ENCOUNTER — Other Ambulatory Visit: Payer: Self-pay

## 2017-12-11 ENCOUNTER — Emergency Department (HOSPITAL_COMMUNITY)
Admission: EM | Admit: 2017-12-11 | Discharge: 2017-12-12 | Disposition: A | Discharge status: Home / Self Care | Payer: Medicaid Other | Source: Home / Self Care | Attending: Emergency Medicine | Admitting: Emergency Medicine

## 2017-12-11 ENCOUNTER — Encounter (HOSPITAL_COMMUNITY): Payer: Self-pay

## 2017-12-11 DIAGNOSIS — R4587 Impulsiveness: Secondary | ICD-10-CM | POA: Diagnosis not present

## 2017-12-11 DIAGNOSIS — T484X4A Poisoning by expectorants, undetermined, initial encounter: Secondary | ICD-10-CM

## 2017-12-11 DIAGNOSIS — F19959 Other psychoactive substance use, unspecified with psychoactive substance-induced psychotic disorder, unspecified: Secondary | ICD-10-CM

## 2017-12-11 DIAGNOSIS — F1490 Cocaine use, unspecified, uncomplicated: Secondary | ICD-10-CM | POA: Diagnosis not present

## 2017-12-11 DIAGNOSIS — F1990 Other psychoactive substance use, unspecified, uncomplicated: Secondary | ICD-10-CM | POA: Diagnosis not present

## 2017-12-11 DIAGNOSIS — F17210 Nicotine dependence, cigarettes, uncomplicated: Secondary | ICD-10-CM

## 2017-12-11 DIAGNOSIS — T50901A Poisoning by unspecified drugs, medicaments and biological substances, accidental (unintentional), initial encounter: Secondary | ICD-10-CM

## 2017-12-11 DIAGNOSIS — T405X4A Poisoning by cocaine, undetermined, initial encounter: Secondary | ICD-10-CM | POA: Diagnosis not present

## 2017-12-11 DIAGNOSIS — F1290 Cannabis use, unspecified, uncomplicated: Secondary | ICD-10-CM | POA: Diagnosis not present
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
## 2017-12-11 NOTE — ED Triage Notes (Signed)
EMS vital signs BP 120/74 HR 80 RR16 O2 sat 98% RA CBG 132
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
## 2017-12-11 NOTE — Patient Outreach (Signed)
CPSS met with the patient and provided substance use recovery support. Patient was very tired and the patient could not stay awake when meeting with [place]. CPSS has met with the patient several times in the past and patient rarely follows up with resources given. CPSS provided an NA/AA meeting list, outpatient/residential substance use treatment resource, homeless shelter resources, and information for [employer]. CPSS suggested that the patient follow up with his [facility] ACT team. CPSS also encouraged the patient to follow up with CPSS regarding help with getting involved with these resources.

## 2017-12-11 NOTE — ED Notes (Signed)
Bed: [facility] Expected date:  Expected time:  Means of arrival:  Comments: 

## 2017-12-11 NOTE — ED Triage Notes (Signed)
[facility] complaints of "I thought I snorted MDMA, but now I'm sleepy and I don't know if it was MDMA I snorted". Patient is ambulatory to stretcher.

## 2017-12-11 NOTE — BH Assessment (Signed)
[facility] Assessment Progress Note  Per [name], DO, this pt does not require psychiatric hospitalization at this time.  Pt is to be discharged from [facility] with recommendation to continue treatment with the [facility] ACT Team.  This has been included in pt's discharge instructions.  Pt's nurse, Diane, has been notified.  Kennith Morss, MA Triage Specialist [phone]     
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
## 2017-12-11 NOTE — Discharge Instructions (Signed)
For your behavioral health needs, you are advised to continue treatment with the [facility] ACT Team: ° °     [facility] ACT Team °     [facility], [address] °     [address] °     [place], [place]  [address] °     [phone] °     Crisis number: [phone] °

## 2017-12-11 NOTE — ED Notes (Signed)
Pt discharged home. Discharged instructions read to pt who verbalized understanding. All belongings returned to pt who signed for same. Denies SI/HI, is not delusional and not responding to internal stimuli. Escorted pt to the ED exit.    

## 2017-12-12 ENCOUNTER — Encounter (HOSPITAL_COMMUNITY): Payer: Self-pay

## 2017-12-12 ENCOUNTER — Emergency Department (HOSPITAL_COMMUNITY)
Admission: EM | Admit: 2017-12-12 | Discharge: 2017-12-13 | Disposition: A | Discharge status: Home / Self Care | Payer: Medicaid Other | Attending: Emergency Medicine | Admitting: Emergency Medicine

## 2017-12-12 DIAGNOSIS — Z0000 Encounter for general adult medical examination without abnormal findings: Secondary | ICD-10-CM | POA: Diagnosis not present

## 2017-12-12 DIAGNOSIS — F909 Attention-deficit hyperactivity disorder, unspecified type: Secondary | ICD-10-CM | POA: Diagnosis not present

## 2017-12-12 DIAGNOSIS — F17210 Nicotine dependence, cigarettes, uncomplicated: Secondary | ICD-10-CM | POA: Diagnosis not present

## 2017-12-12 DIAGNOSIS — F1610 Hallucinogen abuse, uncomplicated: Secondary | ICD-10-CM | POA: Diagnosis not present

## 2017-12-12 DIAGNOSIS — F1410 Cocaine abuse, uncomplicated: Secondary | ICD-10-CM | POA: Diagnosis not present

## 2017-12-12 DIAGNOSIS — F1510 Other stimulant abuse, uncomplicated: Secondary | ICD-10-CM | POA: Diagnosis not present

## 2017-12-12 DIAGNOSIS — R05 Cough: Secondary | ICD-10-CM | POA: Diagnosis present

## 2017-12-12 DIAGNOSIS — R059 Cough, unspecified: Secondary | ICD-10-CM

## 2017-12-12 DIAGNOSIS — F1210 Cannabis abuse, uncomplicated: Secondary | ICD-10-CM | POA: Diagnosis not present

## 2017-12-12 LAB — COMPREHENSIVE METABOLIC PANEL
ALK PHOS: 66 U/L (ref 38–126)
ALT: 18 U/L (ref 17–63)
AST: 16 U/L (ref 15–41)
Albumin: 3.9 g/dL (ref 3.5–5.0)
Anion gap: 5 (ref 5–15)
BUN: 13 mg/dL (ref 6–20)
CALCIUM: 8.9 mg/dL (ref 8.9–10.3)
CO2: 27 mmol/L (ref 22–32)
CREATININE: 0.87 mg/dL (ref 0.61–1.24)
Chloride: 107 mmol/L (ref 101–111)
Glucose, Bld: 116 mg/dL — ABNORMAL HIGH (ref 65–99)
Potassium: 3.5 mmol/L (ref 3.5–5.1)
Sodium: 139 mmol/L (ref 135–145)
Total Bilirubin: 0.3 mg/dL (ref 0.3–1.2)
Total Protein: 6.8 g/dL (ref 6.5–8.1)

## 2017-12-12 LAB — CBC WITH DIFFERENTIAL/PLATELET
BASOS PCT: 0 %
Basophils Absolute: 0 10*3/uL (ref 0.0–0.1)
EOS ABS: 0.2 10*3/uL (ref 0.0–0.7)
Eosinophils Relative: 3 %
HCT: 39.4 % (ref 39.0–52.0)
HEMOGLOBIN: 13.9 g/dL (ref 13.0–17.0)
Lymphocytes Relative: 23 %
Lymphs Abs: 1.9 10*3/uL (ref 0.7–4.0)
MCH: 32.3 pg (ref 26.0–34.0)
MCHC: 35.3 g/dL (ref 30.0–36.0)
MCV: 91.4 fL (ref 78.0–100.0)
Monocytes Absolute: 0.8 10*3/uL (ref 0.1–1.0)
Monocytes Relative: 10 %
NEUTROS PCT: 64 %
Neutro Abs: 5.2 10*3/uL (ref 1.7–7.7)
Platelets: 233 10*3/uL (ref 150–400)
RBC: 4.31 MIL/uL (ref 4.22–5.81)
RDW: 13.2 % (ref 11.5–15.5)
WBC: 8.1 10*3/uL (ref 4.0–10.5)

## 2017-12-12 LAB — ETHANOL: Alcohol, Ethyl (B): <10 mg/dL (ref <10)

## 2017-12-12 LAB — SALICYLATE LEVEL: Salicylate Lvl: <7 mg/dL (ref 2.8–30.0)

## 2017-12-12 LAB — ACETAMINOPHEN LEVEL

## 2017-12-12 NOTE — Discharge Instructions (Signed)
Substance Abuse Treatment Programs ° °Intensive Outpatient Programs °[facility]     °[address]      °[place], [place]                   °[phone]      ° °[facility] °[address] °[place], [place] °[phone] ° °[facility]     °(Inpatient and outpatient)     °[address]           °[phone]   ° °[facility] °[phone] (Suboxone and Methadone) ° °[address]      °[place], [place] [address]      °336-882-2125      ° °[address] °[place], [place] °[phone] ° °[facility] (Outpatient/Inpatient, Chemical)    °(insurance only) [phone]      °       °[facility] (Groups & Residential) °[place], [place] °336-389-1413 ° °   °[facility]     °[address]     °[place], [place]      °[phone]      ° °[facility] (for caregivers and family) °[address] °[place], [place] °336-299-4655 ° ° ° ° ° °Residential Treatment Programs °[facility]      °[address], [place], [place] [address]  °([phone]      ° °[facility] °[address] [place], [place] [address] °[phone] ° °[facility]        °336-248-8914      ° °[facility] °1-800-659-3381 ° °[facility]             °[address]                                         °[place], [place]                                                °877-615-2722 or 336-784-9470                              ° °[facility] [place] °[address] °[place], [address] °1.877.941.8954 ° °[facility]    °[address]      °[place], [place]     °[phone]      ° °[facility] °[address] °[place], [place] °336-285-9073 ° °[facility]   °[address]     °[place], [place] [address]     °[phone]      °Admissions: 8am-3pm M-F ° °[facility] °[address] °[place] °336-227-7417 ° °[facility]: Residential Program (90 Days)   °[place], [place]      °336-725-8389 or 800-758-6077    ° °[facility] °[place], [place] °(Walk in Hours over the weekend or by referral) ° °[facility] °[address], [place], [place] [address] °([phone] ° °Crisis Mobile: [facility]:  1-877-626-1772 (for crisis response 24 hours a day) °[facility] Hotline:      1-800-256-2452 °Outpatient Psychiatry and Counseling ° °[facility]: Mobile Crisis   Management 24 hours:  1-877-626-1772 ° °[facility] sliding scale fee and walk in schedule: M-F 8am-12pm/1pm-3pm °[address]  °[place], [place] [address] °[phone] ° °[facility] °[address] °[place], [place] [address] °[phone] ° °[facility] ([facility] known [facility])- new patient walk-in appointments available Monday - Friday 8am -3pm.          °[address] °[place], [place] [address] °336-676-6840 or crisis line- 336-676-6905 ° °[facility] Intensive Outpatient Therapy Program °[address] °[place], [place] [address] °336-832-9804 ° °[facility]                  °Crisis Services      °[phone]      °[address]     °[place], [place] [address]                ° °[facility]   °[facility] °[phone] °[address] °[place], [place] [address] ° ° °[facility]          °[address]  °[place], [place] [address]       °([phone] ° °[facility] °[address] °[place], [place] [address] °336-292-1510 ° °[facility]    °[address]    °[place], [place] [address]     °[phone]      ° °Parish McKinney, MD     °[address]     °[place] [address]     °336-282-1251     °  °[facility] °[address] °[place] [address] ° °[facility]     °[address]     °[place], [place]      °336-542-2076      ° °[facility] °Shamsher Ahluwalia, MD °[address] °[place], [place] [address] °336-420-9558 ° °[facility]     °[address]     °[place], [place] [address]     °336-274-1237      ° °[facility] °[address] °[place], [place] [address] °336-854-4450 °Resources for Temporary Residential Assistance/Crisis Centers ° °DAY CENTERS °[facility] °M-F 8am-3pm   °[address] [place], [place] [address]   336-332-0824 °Services include: laundry, barbering, support groups, case management, phone  & computer access, showers, AA/NA mtgs, mental health/substance abuse nurse, job skills class, disability information, VA assistance, spiritual classes, etc.  ° °HOMELESS SHELTERS ° °[facility]     °[facility]   °[address], [place]     °336.271.5959       °       °[facility] (women and children)       °[address] °[place], [place] [address] °336-275-0820 °[email or web address] for application and process °Application Required ° °[facility]   °[address]    °[place] [address]     °[phone]       °             °[facility] °[address] °[place], [place] [address] °336.273.5572 °336-235-0363(schedule application appt.) °Application Required ° °[facility] (women only)    °[address]     °[place],  [address]     °336-884-1039      °  Intake starts 6pm daily °Need valid ID, SSC, & Police report °[facility] [place] °[address] °[place], [place] °336-881-5420 °Application Required ° °[facility] (men only)     °[address]      °[place], [place]     °[phone]      ° °[facility] °(Pregnant women only) °[address] °[place], [place] °336-275-0206 ° °[facility]      °[address]      °[place], [place] [address]     °336-722-9951      °       °[facility] °[address] °[place], [place] °336-723-1848 °90 day commitment/SA/Application process ° °[facility](men only)     °[address]     °[place], [place]     °[phone]       °Check-in at 7pm     °       °[facility] °[address] °[place], [place] [address] °336-248-6684 °Men/Women/Women and Children must be there by 7 pm ° °[facility] °[place],  °336-722-8721                ° °

## 2017-12-12 NOTE — ED Notes (Signed)
Pt was able to ambulate by himself with no assistance.

## 2017-12-12 NOTE — ED Notes (Signed)
Pt complains of spitting up bright red blood

## 2017-12-13 ENCOUNTER — Emergency Department (HOSPITAL_COMMUNITY): Payer: Medicaid Other

## 2017-12-13 NOTE — ED Notes (Signed)
Pt is resting in his room. He stated that he has been spitting up blood. No signs of hemoptysis at this time. Pt is asking for food. Pt advised of policy. Pt will only answer a couple questions. Poor historian and uncooperative.
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
## 2017-12-13 NOTE — Discharge Instructions (Signed)
Your chest x-ray is negative and you are stable for discharge home.

## 2017-12-17 ENCOUNTER — Other Ambulatory Visit: Payer: Self-pay

## 2017-12-17 ENCOUNTER — Emergency Department (HOSPITAL_COMMUNITY)
Admission: EM | Admit: 2017-12-17 | Discharge: 2017-12-17 | Disposition: A | Discharge status: Home / Self Care | Payer: Medicaid Other | Attending: Emergency Medicine | Admitting: Emergency Medicine

## 2017-12-17 ENCOUNTER — Encounter (HOSPITAL_COMMUNITY): Payer: Self-pay

## 2017-12-17 DIAGNOSIS — Z5321 Procedure and treatment not carried out due to patient leaving prior to being seen by health care provider: Secondary | ICD-10-CM | POA: Diagnosis not present

## 2017-12-17 DIAGNOSIS — R45851 Suicidal ideations: Secondary | ICD-10-CM | POA: Insufficient documentation

## 2017-12-17 NOTE — ED Triage Notes (Signed)
Pt states "I want to kill myself. I can't find any heroin anywhere and just keep trying to take as much drugs as possible. I just want to get better. If they just sent me to state hospital everything would get better. I just want a peaceful death" Pt requesting something to eat, something to drink, and something for anxiety at this time. Pt does not have a plan, as he states "I cant get enough drugs to kill myself but if I had the money to do it I would"

## 2017-12-18 ENCOUNTER — Encounter (HOSPITAL_COMMUNITY): Payer: Self-pay | Admitting: Emergency Medicine

## 2017-12-18 ENCOUNTER — Other Ambulatory Visit: Payer: Self-pay

## 2017-12-18 ENCOUNTER — Emergency Department (HOSPITAL_COMMUNITY)
Admission: EM | Admit: 2017-12-18 | Discharge: 2017-12-18 | Disposition: A | Discharge status: Home / Self Care | Payer: Medicaid Other | Attending: Emergency Medicine | Admitting: Emergency Medicine

## 2017-12-18 DIAGNOSIS — F1420 Cocaine dependence, uncomplicated: Secondary | ICD-10-CM | POA: Insufficient documentation

## 2017-12-18 DIAGNOSIS — F329 Major depressive disorder, single episode, unspecified: Secondary | ICD-10-CM | POA: Insufficient documentation

## 2017-12-18 DIAGNOSIS — F1490 Cocaine use, unspecified, uncomplicated: Secondary | ICD-10-CM | POA: Diagnosis not present

## 2017-12-18 DIAGNOSIS — F1290 Cannabis use, unspecified, uncomplicated: Secondary | ICD-10-CM

## 2017-12-18 DIAGNOSIS — Z590 Homelessness: Secondary | ICD-10-CM | POA: Diagnosis not present

## 2017-12-18 DIAGNOSIS — R45851 Suicidal ideations: Secondary | ICD-10-CM | POA: Diagnosis not present

## 2017-12-18 DIAGNOSIS — Z79899 Other long term (current) drug therapy: Secondary | ICD-10-CM | POA: Insufficient documentation

## 2017-12-18 DIAGNOSIS — F23 Brief psychotic disorder: Secondary | ICD-10-CM

## 2017-12-18 DIAGNOSIS — F17210 Nicotine dependence, cigarettes, uncomplicated: Secondary | ICD-10-CM

## 2017-12-18 DIAGNOSIS — F32A Depression, unspecified: Secondary | ICD-10-CM

## 2017-12-18 LAB — COMPREHENSIVE METABOLIC PANEL
ALK PHOS: 73 U/L (ref 38–126)
ALT: 19 U/L (ref 17–63)
AST: 26 U/L (ref 15–41)
Albumin: 4.3 g/dL (ref 3.5–5.0)
Anion gap: 10 (ref 5–15)
BUN: 11 mg/dL (ref 6–20)
CALCIUM: 9.6 mg/dL (ref 8.9–10.3)
CO2: 24 mmol/L (ref 22–32)
Chloride: 104 mmol/L (ref 101–111)
Creatinine, Ser: 1 mg/dL (ref 0.61–1.24)
GFR calc non Af Amer: >60 mL/min (ref >60)
GLUCOSE: 102 mg/dL — AB (ref 65–99)
Potassium: 3.7 mmol/L (ref 3.5–5.1)
SODIUM: 138 mmol/L (ref 135–145)
Total Bilirubin: 0.6 mg/dL (ref 0.3–1.2)
Total Protein: 7.1 g/dL (ref 6.5–8.1)

## 2017-12-18 LAB — CBC
HEMATOCRIT: 41.6 % (ref 39.0–52.0)
HEMOGLOBIN: 14.7 g/dL (ref 13.0–17.0)
MCH: 31.7 pg (ref 26.0–34.0)
MCHC: 35.3 g/dL (ref 30.0–36.0)
MCV: 89.7 fL (ref 78.0–100.0)
Platelets: 258 10*3/uL (ref 150–400)
RBC: 4.64 MIL/uL (ref 4.22–5.81)
RDW: 12.9 % (ref 11.5–15.5)
WBC: 10.5 10*3/uL (ref 4.0–10.5)

## 2017-12-18 LAB — RAPID URINE DRUG SCREEN, HOSP PERFORMED
Amphetamines: NOT DETECTED
Barbiturates: NOT DETECTED
Benzodiazepines: NOT DETECTED
Cocaine: POSITIVE — AB
Opiates: NOT DETECTED
TETRAHYDROCANNABINOL: NOT DETECTED

## 2017-12-18 LAB — SALICYLATE LEVEL: Salicylate Lvl: <7 mg/dL (ref 2.8–30.0)

## 2017-12-18 LAB — ETHANOL: Alcohol, Ethyl (B): <10 mg/dL (ref <10)

## 2017-12-18 LAB — ACETAMINOPHEN LEVEL: Acetaminophen (Tylenol), Serum: <10 ug/mL — ABNORMAL LOW (ref 10–30)
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
## 2017-12-18 NOTE — ED Notes (Signed)
Items inventoried and placed in locker #3

## 2017-12-18 NOTE — ED Notes (Signed)
Pharmacy advised will request Pharm Tech to perform Med Rec.

## 2017-12-18 NOTE — ED Triage Notes (Signed)
[facility]. Pt states if he is allowed to leave he will kill himself. Pt states he attempted to kill himself today with heroin but missed the vein. Pt reports drinking small amount of etoh, crack and a bottle of cough syrup.  Pt reports being depressed d/t no support system and no where to go.

## 2017-12-18 NOTE — ED Notes (Addendum)
Per Riley LamJustina O, NP, [facility] - recommendation is for pt to be d/c'd to home. Waiting for note to be entered.

## 2017-12-18 NOTE — ED Notes (Signed)
Regular Diet was ordered for Dinner. 

## 2017-12-18 NOTE — BH Assessment (Addendum)
Tele Assessment Note   Patient Name: Thomas [name] MRN: [id number] Referring Physician: Dr. Stark Jock Location of Patient: [facility] ED Location of Provider: [place] Philippe Gang is an 22 y.o. male. He came in stating he wants to kill himself.  When asked why he came to the hospital, he stated, "I met Jesus Christ."  He stated he wanted to try to kill himself by injecting heroin in his veins, but he missed the vein.  Tonight the pt stated he took "molly", cough syrup and crack.  The pt's most recent UDS is positive for cocaine.  He has had UDS on 2/25, 2/23, 2/22, 10/11/17, 10/04/17 and 10/03/2017 and the pt was positive for cocaine on each visits.  He was positive for amphetamines 4 out of the 7 times.  He described his primary stressor as not having any family,  Friends and being homeless.  He is currently living in a tent.  When asked if he ever tried to kill himself in the past the pt stated he took someone else's drugs.  The pt currently has an ACT team.  When asked about the ACT team, he responded, "Fuck them.Marland KitchenMarland KitchenThey fucked themselves."  He stated his counselor and psychiatrist was, "the devil".  During the assessment the pt to speak in a derogatory way to the counselor, by saying things such as, "up your ass."  The pt was more polite when redirected.  The pt said he sees demons.  He reported he is eating and sleeping well.  The pt was drowsy during the assessment and irritable .  He did not appear very willing to share information.  The pt denies HI.  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 ADHD (attention deficit hyperactivity disorder)   . Anxiety   . Bipolar 1 disorder ([place])   . Current smoker   . Depression   . Eating disorder   . Headache(784.0)   . History of ADHD 11/01/2015  . Medical history non-contributory   . Mental disorder   .  Nicotine dependence 11/03/2015  . Psychoactive substance-induced mood disorder ([place]) 10/28/2015  . Schizophrenia ([place])     History reviewed. No pertinent surgical history.  Family History: No family history on file.  Social History:  reports that he has been smoking cigarettes.  He has been smoking about 0.00 packs per day for the past 4.00 years. he has never used smokeless tobacco. He reports that he drinks alcohol. He reports that he uses drugs. Drugs: Marijuana, Cocaine, LSD, MDMA (Ecstacy), and Methamphetamines.  Additional Social History:     CIWA: CIWA-Ar BP: 118/72 Pulse Rate: 83 COWS:    Allergies: No Known Allergies  Home Medications:  (Not in a hospital admission)  OB/GYN Status:  No LMP for male patient.  General Assessment Data Location of Assessment: [facility] ED TTS Assessment: In system Is this a Tele or Face-to-Face Assessment?: Tele Assessment Is this an Initial Assessment or a Re-assessment for this encounter?: Initial Assessment Marital status: Single Maiden name: NA Is patient pregnant?: Other (Comment)(male) Living Arrangements: Other (Comment)(homeless) Can pt return to current living arrangement?: Yes Admission Status: Voluntary Is patient capable of signing voluntary admission?: Yes Referral Source: Self/Family/Friend Insurance type: Medicaid     Crisis Care Plan Living Arrangements: Other (Comment)(homeless) Legal Guardian: Other:(Self) Name of Psychiatrist: ACT Team. Name of Therapist: ACT Team.  Education Status Is patient currently in  school?: No Current Grade: NA Highest grade of school patient has completed: GED. Name of school: NA Contact person: NA  Risk to self with the past 6 months Suicidal Ideation: Yes-Currently Present Has patient been a risk to self within the past 6 months prior to admission? : Yes Suicidal Intent: Yes-Currently Present Has patient had any suicidal intent within the past 6 months prior to admission? : Yes Is  patient at risk for suicide?: Yes Suicidal Plan?: Yes-Currently Present Has patient had any suicidal plan within the past 6 months prior to admission? : Yes Specify Current Suicidal Plan: overdose on heroin Access to Means: Yes Specify Access to Suicidal Means: can get heroin according to the pt What has been your use of drugs/alcohol within the last 12 months?: used "molly", crack, and cough syrup Previous Attempts/Gestures: No How many times?: 0 Other Self Harm Risks: drug use Triggers for Past Attempts: Unknown Intentional Self Injurious Behavior: None Family Suicide History: Unable to assess Recent stressful life event(s): Other (Comment), Financial Problems(homeless) Persecutory voices/beliefs?: No Depression: Yes Depression Symptoms: Feeling worthless/self pity, Loss of interest in usual pleasures Substance abuse history and/or treatment for substance abuse?: Yes Suicide prevention information given to non-admitted patients: Not applicable  Risk to Others within the past 6 months Homicidal Ideation: No Does patient have any lifetime risk of violence toward others beyond the six months prior to admission? : No Thoughts of Harm to Others: No Current Homicidal Intent: No Current Homicidal Plan: No Access to Homicidal Means: No Identified Victim: NA History of harm to others?: No Assessment of Violence: None Noted Violent Behavior Description: none Does patient have access to weapons?: No Criminal Charges Pending?: Yes Describe Pending Criminal Charges: larceny Does patient have a court date: Yes Court Date: 01/18/18 Is patient on probation?: Unknown  Psychosis Hallucinations: Visual, Auditory Delusions: Unspecified(religious)  Mental Status Report Appearance/Hygiene: In scrubs Eye Contact: Poor Motor Activity: Unable to assess Speech: Slurred Level of Consciousness: Drowsy Mood: Irritable Affect: Irritable Anxiety Level: None Thought Processes: Coherent,  Relevant Judgement: Impaired Orientation: Person, Place, Time, Situation Obsessive Compulsive Thoughts/Behaviors: None  Cognitive Functioning Concentration: Normal Memory: Recent Intact, Remote Intact IQ: Average Insight: Poor Impulse Control: Poor Appetite: Good Weight Loss: 0 Weight Gain: 0 Sleep: No Change Total Hours of Sleep: 8 Vegetative Symptoms: None  ADLScreening [facility] Assessment Services) Patient's cognitive ability adequate to safely complete daily activities?: Yes Patient able to express need for assistance with ADLs?: Yes Independently performs ADLs?: Yes (appropriate for developmental age)  Prior Inpatient Therapy Prior Inpatient Therapy: Yes Prior Therapy Dates: Pt reported, a long time age.  Prior Therapy Facilty/Provider(s): [facility] Reason for Treatment: Depression, halluncinations.   Prior Outpatient Therapy Prior Outpatient Therapy: Yes Prior Therapy Dates: Current Prior Therapy Facilty/Provider(s): ACTT Team Reason for Treatment: Medication management and counseling.  Does patient have an ACCT team?: Yes Does patient have Intensive In-House Services?  : No Does patient have [facility] services? : No Does patient have P4CC services?: No  ADL Screening (condition at time of admission) Patient's cognitive ability adequate to safely complete daily activities?: Yes Patient able to express need for assistance with ADLs?: Yes Independently performs ADLs?: Yes (appropriate for developmental age)       Abuse/Neglect Assessment (Assessment to be complete while patient is alone) Abuse/Neglect Assessment Can Be Completed: Yes Physical Abuse: Denies Verbal Abuse: Denies Sexual Abuse: Denies Exploitation of patient/patient's resources: Denies Self-Neglect: Denies Values / Beliefs Cultural Requests During Hospitalization: None Spiritual Requests During Hospitalization: None Consults  Spiritual Care Consult Needed: No Social Work Consult Needed: No       Additional Information 1:1 In Past 12 Months?: No CIRT Risk: No Elopement Risk: No Does patient have medical clearance?: Yes     Disposition:  Disposition Initial Assessment Completed for this Encounter: Yes Type of inpatient treatment program: Adult Patient referred to: Other (Comment)(inpatient treatment)   The pt is recommended for inpatient treatment. RN Claiborne Billings was notified of the recommendation.  This service was provided via telemedicine using a 2-way, interactive audio and video technology.  Names of all persons participating in this telemedicine service and their role in this encounter. Name: Clary Meeker Role: Pt  Name: Virgina Organ Role: TTS  Name:  Role:   Name:  Role:     Enzo Montgomery 12/18/2017 5:26 AM
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
## 2017-12-18 NOTE — ED Notes (Signed)
Regular Diet was ordered for Lunch. 

## 2017-12-18 NOTE — ED Notes (Signed)
Telepsych being performed. 

## 2017-12-18 NOTE — Discharge Instructions (Signed)
Please return with any concerns. ° °Substance Abuse Treatment Programs ° °Intensive Outpatient Programs °[facility]     °[address]      °[place], [name]                   °[phone]      ° °[facility] °[address] °[place], [place] °336-379-7146 ° °[facility]     °(Inpatient and outpatient)     °[address]           °336-832-9800   ° °[facility] °336-288-1484 (Suboxone and Methadone) ° °[address]      °[place], [place] [address]      °336-882-2125      ° °[address] °[place], [place] °852-3033 ° °[facility] (Outpatient/Inpatient, Chemical)    °(insurance only) [phone]      °       °[facility] (Groups & Residential) °[place], [place] °336-389-1413 ° °   °[facility]     °[address]     °[place], [place]      °[phone]      ° °[facility] (for caregivers and family) °[address] °[place], [place] °336-299-4655 ° ° ° ° ° °Residential Treatment Programs °[facility]      °[address], [place], [place] [address]  °([phone]      ° °[facility] °[address] [place], [place] [address] °[phone] ° °[facility]        °336-248-8914      ° °[facility] °1-800-659-3381 ° °[facility]             °[address]                                         °[place], [place]                                                °877-615-2722 or 336-784-9470                              ° °[facility] [place] °[address] °[place], [address] °1.877.941.8954 ° °[facility]    °[address]      °[place], [place]     °[phone]      ° °[facility] °[address] °[place], [place] °336-285-9073 ° °[facility]   °[address]     °[place], [place] [address]     °336-899-1550      °Admissions: 8am-3pm M-F ° °[facility] °[address] °[place], [place] °336-227-7417 ° °[facility]: Residential Program (90 Days)   °[place], [place]      °336-725-8389 or 800-758-6077    ° °[facility] °[place], [place] °(Walk in Hours over the weekend or by referral) ° °[facility] °[address], [place], [place] [address] °([phone]) [phone] ° °Crisis Mobile: [facility]:  1-877-626-1772 (for crisis response 24 hours a day) °[facility] Hotline:      1-800-256-2452 °Outpatient Psychiatry and   Counseling ° °[facility]: Mobile Crisis Management 24 hours:  1-877-626-1772 ° °[facility] sliding scale fee and walk in schedule: M-F 8am-12pm/1pm-3pm °[address]  °[place], [place] [address] °[phone] ° °[facility] °[address] °[place], [place] [address] °336-703-9650 ° °[facility] (Former[facility] known [facility])- new patient walk-in appointments available Monday - Friday 8am -3pm.          °[address] °[place], [place] [address] °336-676-6840 or crisis line- 336-676-6905 ° °[facility]/ Intensive Outpatient Therapy Program °[address] °[place], [place] [address] °336-832-9804 ° °[facility]                  °Crisis Services      °336.641.4993      °[address]     °[place], [place] [address]                ° °[facility]   °[facility] °[phone] °[address] °[place], [place] [address] ° ° °[facility]          °[address]  °[place], [place] [address]       °([phone] [phone] ° °[facility] °[address] °[place], [place] [address] °336-292-1510 ° °[facility]    °[address]    °[place], [place] [address]     °336-632-3505      ° °Parish McKinney, MD     °[address]     °[place] [address]     °336-282-1251     °  °[facility] °[address] °[place] [address] ° °[facility]     °[address]     °[place], [place]      °336-542-2076      ° °[facility] °Shamsher Ahluwalia, MD °[address] °[place], [place] [address] °336-420-9558 ° °[facility]     °[address]     °[place], [place] [address]     °336-274-1237      ° °[facility] °[address] °[place], [place] [address] °336-854-4450 °Resources for Temporary Residential Assistance/Crisis Centers ° °DAY CENTERS °[facility] °M-F 8am-3pm   °[address] [place], [place] [address]   336-332-0824 °Services include: laundry, barbering, support groups, case management, phone  & computer access, showers, AA/NA mtgs, mental health/substance abuse nurse, job skills class, disability information, VA assistance, spiritual classes, etc.  ° °HOMELESS SHELTERS ° °[facility]     °[facility]   °[address], [place]     °336.271.5959       °       °[facility] (women and children)       °[address] °[place], [place] [address] °336-275-0820 °[email or web address] for application and process °Application Required ° °[facility]   °[address]    °[place] [address]     °[phone]       °             °[facility] °[address] °, [place] [address] °336.273.5572 °336-235-0363(schedule application appt.) °Application Required ° °[facility] (women only)    °[address]     °[place],  [address]     °  336-884-1039      °Intake starts 6pm daily °Need valid ID, SSC, & Police report °[facility] [place] °[address] °[place], [place] °336-881-5420 °Application Required ° °[facility] (men only)     °[address]      °[place], [place]     °[phone]      ° °[facility] °(Pregnant women only) °[address] °[place] °336-275-0206 ° °[facility]      °[address]      °[place], [place] [address]     °336-722-9951      °       °[facility] °[address] °[place], [place] °336-723-1848 °90 day commitment/SA/Application process ° °[facility](men only)     °[address]     °[place], [place]     °[phone]       °Check-in at 7pm     °       °[facility] °[address] °[place], [place] [address] °336-248-6684 °Men/Women/Women and Children must be there by 7 pm ° °[facility] °[place],  °336-722-8721                ° °

## 2017-12-18 NOTE — ED Notes (Signed)
Pharmacy Tech in w/pt. 

## 2017-12-18 NOTE — ED Notes (Signed)
TTS in progress 

## 2017-12-19 ENCOUNTER — Emergency Department (HOSPITAL_COMMUNITY)
Admission: EM | Admit: 2017-12-19 | Discharge: 2017-12-19 | Disposition: A | Discharge status: Home / Self Care | Payer: Medicaid Other | Attending: Emergency Medicine | Admitting: Emergency Medicine

## 2017-12-19 ENCOUNTER — Encounter (HOSPITAL_COMMUNITY): Payer: Self-pay | Admitting: Emergency Medicine

## 2017-12-19 ENCOUNTER — Other Ambulatory Visit: Payer: Self-pay

## 2017-12-19 DIAGNOSIS — Z79899 Other long term (current) drug therapy: Secondary | ICD-10-CM | POA: Insufficient documentation

## 2017-12-19 DIAGNOSIS — T50904A Poisoning by unspecified drugs, medicaments and biological substances, undetermined, initial encounter: Secondary | ICD-10-CM | POA: Diagnosis not present

## 2017-12-19 DIAGNOSIS — F17210 Nicotine dependence, cigarettes, uncomplicated: Secondary | ICD-10-CM | POA: Diagnosis not present

## 2017-12-19 LAB — COMPREHENSIVE METABOLIC PANEL
ALT: 19 U/L (ref 17–63)
ANION GAP: 10 (ref 5–15)
AST: 19 U/L (ref 15–41)
Albumin: 4.9 g/dL (ref 3.5–5.0)
Alkaline Phosphatase: 75 U/L (ref 38–126)
BUN: 8 mg/dL (ref 6–20)
CHLORIDE: 105 mmol/L (ref 101–111)
CO2: 26 mmol/L (ref 22–32)
Calcium: 9.5 mg/dL (ref 8.9–10.3)
Creatinine, Ser: 0.9 mg/dL (ref 0.61–1.24)
Glucose, Bld: 91 mg/dL (ref 65–99)
POTASSIUM: 3.9 mmol/L (ref 3.5–5.1)
Sodium: 141 mmol/L (ref 135–145)
Total Bilirubin: 0.5 mg/dL (ref 0.3–1.2)
Total Protein: 8.3 g/dL — ABNORMAL HIGH (ref 6.5–8.1)

## 2017-12-19 LAB — CBC WITH DIFFERENTIAL/PLATELET
BASOS ABS: 0 10*3/uL (ref 0.0–0.1)
Basophils Relative: 0 %
Eosinophils Absolute: 0.1 10*3/uL (ref 0.0–0.7)
Eosinophils Relative: 1 %
HEMATOCRIT: 45.3 % (ref 39.0–52.0)
Hemoglobin: 15.7 g/dL (ref 13.0–17.0)
LYMPHS PCT: 22 %
Lymphs Abs: 1.7 10*3/uL (ref 0.7–4.0)
MCH: 31.9 pg (ref 26.0–34.0)
MCHC: 34.7 g/dL (ref 30.0–36.0)
MCV: 92.1 fL (ref 78.0–100.0)
Monocytes Absolute: 0.5 10*3/uL (ref 0.1–1.0)
Monocytes Relative: 6 %
NEUTROS ABS: 5.3 10*3/uL (ref 1.7–7.7)
NEUTROS PCT: 71 %
Platelets: 249 10*3/uL (ref 150–400)
RBC: 4.92 MIL/uL (ref 4.22–5.81)
RDW: 13 % (ref 11.5–15.5)
WBC: 7.6 10*3/uL (ref 4.0–10.5)

## 2017-12-19 LAB — ETHANOL

## 2017-12-19 LAB — ACETAMINOPHEN LEVEL

## 2017-12-19 LAB — SALICYLATE LEVEL: Salicylate Lvl: <7 mg/dL (ref 2.8–30.0)

## 2017-12-19 MED ORDER — SODIUM CHLORIDE 0.9 % IV BOLUS (SEPSIS)
1000.0000 mL | Freq: Once | INTRAVENOUS | Status: AC
  Administered 2017-12-19: 1000 mL via INTRAVENOUS

## 2017-12-19 NOTE — Discharge Instructions (Signed)
Follow up with outpatient psychiatry
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
## 2017-12-19 NOTE — ED Triage Notes (Signed)
Pt reports he feels weird after drinking 3 bottles [employer] brand cough syrup at noon yesterday. Currently c/o tachycardia but EMS ekg NSR rate 80 BP: 141/93 HR: 88 Resp:18,  CBG 102

## 2017-12-19 NOTE — ED Notes (Signed)
Bed: [id number] Expected date:  Expected time:  Means of arrival:  Comments: Ems drank cough syrup

## 2017-12-24 ENCOUNTER — Emergency Department (HOSPITAL_COMMUNITY)
Admission: EM | Admit: 2017-12-24 | Discharge: 2017-12-24 | Disposition: A | Discharge status: Home / Self Care | Payer: Medicaid Other | Attending: Physician Assistant | Admitting: Physician Assistant

## 2017-12-24 ENCOUNTER — Encounter (HOSPITAL_COMMUNITY): Payer: Self-pay | Admitting: Nurse Practitioner

## 2017-12-24 ENCOUNTER — Other Ambulatory Visit: Payer: Self-pay

## 2017-12-24 DIAGNOSIS — R45851 Suicidal ideations: Secondary | ICD-10-CM | POA: Insufficient documentation

## 2017-12-24 DIAGNOSIS — F17210 Nicotine dependence, cigarettes, uncomplicated: Secondary | ICD-10-CM | POA: Diagnosis not present

## 2017-12-24 DIAGNOSIS — F319 Bipolar disorder, unspecified: Secondary | ICD-10-CM | POA: Diagnosis not present

## 2017-12-24 DIAGNOSIS — F329 Major depressive disorder, single episode, unspecified: Secondary | ICD-10-CM | POA: Insufficient documentation

## 2017-12-24 DIAGNOSIS — Z79899 Other long term (current) drug therapy: Secondary | ICD-10-CM | POA: Diagnosis not present

## 2017-12-24 DIAGNOSIS — T485X2A Poisoning by other anti-common-cold drugs, intentional self-harm, initial encounter: Secondary | ICD-10-CM | POA: Insufficient documentation

## 2017-12-24 DIAGNOSIS — F32A Depression, unspecified: Secondary | ICD-10-CM

## 2017-12-24 DIAGNOSIS — F209 Schizophrenia, unspecified: Secondary | ICD-10-CM | POA: Insufficient documentation

## 2017-12-24 LAB — COMPREHENSIVE METABOLIC PANEL
ALBUMIN: 4.4 g/dL (ref 3.5–5.0)
ALT: 20 U/L (ref 17–63)
ANION GAP: 9 (ref 5–15)
AST: 31 U/L (ref 15–41)
Alkaline Phosphatase: 63 U/L (ref 38–126)
BILIRUBIN TOTAL: 0.7 mg/dL (ref 0.3–1.2)
BUN: 10 mg/dL (ref 6–20)
CO2: 26 mmol/L (ref 22–32)
Calcium: 9.4 mg/dL (ref 8.9–10.3)
Chloride: 105 mmol/L (ref 101–111)
Creatinine, Ser: 1.02 mg/dL (ref 0.61–1.24)
GFR calc Af Amer: >60 mL/min (ref >60)
GFR calc non Af Amer: >60 mL/min (ref >60)
GLUCOSE: 107 mg/dL — AB (ref 65–99)
POTASSIUM: 4.7 mmol/L (ref 3.5–5.1)
SODIUM: 140 mmol/L (ref 135–145)
TOTAL PROTEIN: 7.6 g/dL (ref 6.5–8.1)

## 2017-12-24 LAB — RAPID URINE DRUG SCREEN, HOSP PERFORMED
Amphetamines: NOT DETECTED
Barbiturates: NOT DETECTED
Benzodiazepines: NOT DETECTED
Cocaine: POSITIVE — AB
Opiates: NOT DETECTED
Tetrahydrocannabinol: POSITIVE — AB

## 2017-12-24 LAB — CBC WITH DIFFERENTIAL/PLATELET
Basophils Absolute: 0 K/uL (ref 0.0–0.1)
Basophils Relative: 0 %
Eosinophils Absolute: 0.1 K/uL (ref 0.0–0.7)
Eosinophils Relative: 1 %
HCT: 43.5 % (ref 39.0–52.0)
Hemoglobin: 15.4 g/dL (ref 13.0–17.0)
Lymphocytes Relative: 18 %
Lymphs Abs: 1.3 K/uL (ref 0.7–4.0)
MCH: 32.2 pg (ref 26.0–34.0)
MCHC: 35.4 g/dL (ref 30.0–36.0)
MCV: 91 fL (ref 78.0–100.0)
Monocytes Absolute: 0.7 K/uL (ref 0.1–1.0)
Monocytes Relative: 10 %
Neutro Abs: 5.2 K/uL (ref 1.7–7.7)
Neutrophils Relative %: 71 %
Platelets: 220 K/uL (ref 150–400)
RBC: 4.78 MIL/uL (ref 4.22–5.81)
RDW: 12.7 % (ref 11.5–15.5)
WBC: 7.3 K/uL (ref 4.0–10.5)

## 2017-12-24 LAB — SALICYLATE LEVEL

## 2017-12-24 LAB — ACETAMINOPHEN LEVEL: Acetaminophen (Tylenol), Serum: <10 ug/mL — ABNORMAL LOW (ref 10–30)

## 2017-12-24 LAB — ETHANOL: Alcohol, Ethyl (B): <10 mg/dL

## 2017-12-24 MED ORDER — IBUPROFEN 200 MG PO TABS: 600.0000 mg | ORAL_TABLET | Freq: Three times a day (TID) | ORAL | Status: DC | PRN

## 2017-12-24 MED ORDER — ONDANSETRON HCL 4 MG PO TABS: 4.0000 mg | ORAL_TABLET | Freq: Three times a day (TID) | ORAL | Status: DC | PRN

## 2017-12-24 NOTE — ED Notes (Signed)
Bed: [facility] Expected date:  Expected time:  Means of arrival:  Comments: 

## 2017-12-24 NOTE — ED Notes (Signed)
Pt given belongings and is dressing at this time.

## 2017-12-24 NOTE — BH Assessment (Addendum)
Assessment Note  Thomas [name] is an 22 y.o. male that presents this date with thoughts of self harm with a plan to overdose. Patient states "he guesses he was trying to end it" but is very unclear in reference to his intentions. Patient states "I just started feeling really weird and wish I was dead" after consuming over 2 bottles of cough syrup earlier this date. Patient is noted per chart review, to have over 20 visits in the last 6 months to area emergency department. He often presents after ingestion of substances and with suicidal ideation. Patient reports that he consumed 2 bottles of cough medicine last night. He is found at [place] this morning and brought to the emergency department. Patient endorses suicidal ideation at this time. Patient has a history of ADHD, Anxiety Disorder, Bipolar I Disorder, depression and Polysubstance abuse. Patient states he is "partying a lot" due to being [place]. Patient states that he has had several suicide attempts in the past that are "too many to count." Patient denies self injurious behaviors but states that he used to "think about it a lot." Patient reports he has been receiving services from [facility] but cannot recall the last time he saw that provider. Patient reports medication non-compliance at this time. Patient denies HI or AVH. Patient is time place oriented and presents with a drowsy affect. Patient is observed to be impaired at the time of assessment and finds it difficult to stay awake.Patient denies history of aggression but has upcoming charges on 01/22/18 for larceny. Patient does not appear to be responding to internal stimuli at time of the assessment.Patient has a history of polysubstance abuse and tested positive for cocaine and THC this date.  BAL is negative. Case was staffed with Shaune Pollack DNP who recommended patient be observed and monitored for safety and discharged later this date.      Diagnosis: F31.0  Bipolar affective (per notes) Polysubstance abuse  Past Medical History:  Past Medical History:  Diagnosis Date  . ADHD (attention deficit hyperactivity disorder)   . Anxiety   . Bipolar 1 disorder (HCC)   . Current smoker   . Depression   . Eating disorder   . Headache(784.0)   . History of ADHD 11/01/2015  . Medical history non-contributory   . Mental disorder   . Nicotine dependence 11/03/2015  . 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 Schizophrenia (HCC)     History reviewed. No pertinent surgical history.  Family History: History reviewed. No pertinent family history.  Social History:  reports that he has been smoking cigarettes.  He has been smoking about 0.00 packs per day for the past 4.00 years. he has never used smokeless tobacco. He reports that he drinks alcohol. He reports that he uses drugs. Drugs: Marijuana, Cocaine, LSD, MDMA (Ecstacy), and Methamphetamines.  Additional Social History:  Alcohol / Drug Use Pain Medications: See MAR Prescriptions: See MAR Over the Counter: See MAR History of alcohol / drug use?: Yes Longest period of sobriety (when/how long): Unknown Negative Consequences of Use: Financial, Personal relationships Withdrawal Symptoms: (Denies) Substance #1 Name of Substance 1: Amphetamines.  1 - Age of First Use: UTA 1 - Amount (size/oz): Pt reports "different amounts substances" 1 - Frequency: UTA 1 - Duration: Ongoing. 1 - Last Use / Amount: Pt reports earlier this date 2 bottles of cough syrup Substance #2 Name of Substance 2: Cocaine. 2 - Age of First Use: UTA 2 -  Amount (size/oz): Pt denies current use 2 - Frequency: UTA 2 - Duration: UTA 2 - Last Use / Amount: Pt denies current use  CIWA: CIWA-Ar BP: 101/71 Pulse Rate: 77 COWS:    Allergies: No Known Allergies  Home Medications:  (Not in a hospital admission)  OB/GYN Status:  No LMP for male patient.  General Assessment Data Assessment unable to be  completed: Yes Reason for not completing assessment: pt is impaired Location of Assessment: [facility] TTS Assessment: In system Is this a Tele or Face-to-Face Assessment?: Face-to-Face Is this an Initial Assessment or a Re-assessment for this encounter?: Initial Assessment Marital status: Single Maiden name: NA Is patient pregnant?: No Pregnancy Status: No Living Arrangements: Alone Can pt return to current living arrangement?: Yes Admission Status: Voluntary Is patient capable of signing voluntary admission?: Yes Referral Source: Self/Family/Friend Insurance type: Medicaid  Medical Screening Exam [facility] Walk-in ONLY) Medical Exam completed: Yes  Crisis Care Plan Living Arrangements: Alone Legal Guardian: (Self) Name of Psychiatrist: [facility] Name of Therapist: [facility]  Education Status Is patient currently in school?: No Current Grade: NA Highest grade of school patient has completed: GED Name of school: NA Contact person: NA Is the patient employed, unemployed or receiving disability?: Unemployed  Risk to self with the past 6 months Suicidal Ideation: Yes-Currently Present Has patient been a risk to self within the past 6 months prior to admission? : Yes Suicidal Intent: Yes-Currently Present Has patient had any suicidal intent within the past 6 months prior to admission? : Yes Is patient at risk for suicide?: Yes Suicidal Plan?: Yes-Currently Present Has patient had any suicidal plan within the past 6 months prior to admission? : Yes Specify Current Suicidal Plan: Overdose Access to Means: Yes Specify Access to Suicidal Means: Cough medicine What has been your use of drugs/alcohol within the last 12 months?: Current use Previous Attempts/Gestures: Yes How many times?: (Multiple) Other Self Harm Risks: (Excessive SA use) Triggers for Past Attempts: Unknown Intentional Self Injurious Behavior: None Family Suicide History: No Recent stressful life event(s): Other  (Comment)(Excessive SA use) Persecutory voices/beliefs?: No Depression: No Depression Symptoms: (Denies symptoms) Substance abuse history and/or treatment for substance abuse?: Yes Suicide prevention information given to non-admitted patients: Not applicable  Risk to Others within the past 6 months Homicidal Ideation: No Does patient have any lifetime risk of violence toward others beyond the six months prior to admission? : No Thoughts of Harm to Others: No Current Homicidal Intent: No Current Homicidal Plan: No Access to Homicidal Means: No Identified Victim: NA History of harm to others?: No Assessment of Violence: None Noted Violent Behavior Description: None Does patient have access to weapons?: No Criminal Charges Pending?: Yes Describe Pending Criminal Charges: Larceny Does patient have a court date: Yes Court Date: 01/18/18 Is patient on probation?: No  Psychosis Hallucinations: None noted Delusions: None noted  Mental Status Report Appearance/Hygiene: In scrubs Eye Contact: Poor Motor Activity: Freedom of movement Speech: Slow, Slurred Level of Consciousness: Drowsy Mood: Pleasant Affect: Flat Anxiety Level: Minimal Thought Processes: Thought Blocking Judgement: Partial Orientation: Person, Place, Time Obsessive Compulsive Thoughts/Behaviors: None  Cognitive Functioning Concentration: Decreased Memory: Recent Intact Is patient IDD: No Is patient DD?: No Insight: Poor Impulse Control: Poor Appetite: Fair Have you had any weight changes? : No Change Sleep: No Change Total Hours of Sleep: 6 Vegetative Symptoms: None  ADLScreening [facility] Assessment Services) Patient's cognitive ability adequate to safely complete daily activities?: Yes Patient able to express need for  assistance with ADLs?: Yes Independently performs ADLs?: Yes (appropriate for developmental age)  Prior Inpatient Therapy Prior Inpatient Therapy: Yes Prior Therapy Dates: 2018 (Multiple  admissions) Prior Therapy Facilty/Provider(s): [facility] Reason for Treatment: MH issues  Prior Outpatient Therapy Prior Outpatient Therapy: Yes Prior Therapy Dates: Ongoing Prior Therapy Facilty/Provider(s): [facility] Reason for Treatment: Med mang Does patient have an ACCT team?: Yes Does patient have Intensive In-House Services?  : No Does patient have [facility] services? : No Does patient have P4CC services?: No  ADL Screening (condition at time of admission) Patient's cognitive ability adequate to safely complete daily activities?: Yes Is the patient deaf or have difficulty hearing?: No Does the patient have difficulty seeing, even when wearing glasses/contacts?: No Does the patient have difficulty concentrating, remembering, or making decisions?: Yes Patient able to express need for assistance with ADLs?: Yes Does the patient have difficulty dressing or bathing?: No Independently performs ADLs?: Yes (appropriate for developmental age) Does the patient have difficulty walking or climbing stairs?: No Weakness of Legs: None Weakness of Arms/Hands: None  Home Assistive Devices/Equipment Home Assistive Devices/Equipment: None  Therapy Consults (therapy consults require a physician order) PT Evaluation Needed: No OT Evalulation Needed: No SLP Evaluation Needed: No Abuse/Neglect Assessment (Assessment to be complete while patient is alone) Physical Abuse: Denies Verbal Abuse: Denies Sexual Abuse: Denies Exploitation of patient/patient's resources: Denies Self-Neglect: Denies Values / Beliefs Cultural Requests During Hospitalization: None Spiritual Requests During Hospitalization: None Consults Spiritual Care Consult Needed: No Social Work Consult Needed: No Merchant navy officer (For Healthcare) Does Patient Have a Medical Advance Directive?: No Would patient like information on creating a medical advance directive?: No - Patient declined    Additional Information 1:1 In Past 12  Months?: No CIRT Risk: No Elopement Risk: No Does patient have medical clearance?: Yes     Disposition: Case was staffed with Shaune Pollack DNP who recommended patient be observed and monitored for safety and discharged later this date.   Disposition Initial Assessment Completed for this Encounter: Yes Disposition of Patient: Discharge Type of inpatient treatment program: (Pt will be discharged later this date) Patient refused recommended treatment: No Mode of transportation if patient is discharged?: (Unknown) Patient referred to: (Pt to be discharged later this date)  On Site Evaluation by:   Reviewed with Physician:    Alfredia Ferguson 12/24/2017 3:07 PM
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
## 2017-12-24 NOTE — ED Triage Notes (Signed)
Patient brought in by EMS from [place]. Patient is homeless. Patient stated he drank 2 bottles of robitussin DM. Patient requested narcan and fluids from EMS. Patient has been off his medications for and week. Patient has a history of muli-drug abuse but states he has not done any drugs in over a week.

## 2017-12-24 NOTE — Discharge Instructions (Signed)
Follow-up with outpatient resources provided to you

## 2017-12-24 NOTE — ED Notes (Signed)
Pt alert and oriented x 4 at this time. Requesting discharge Dr.Knapp notified.

## 2017-12-24 NOTE — ED Notes (Signed)
Bed: [id number] Expected date:  Expected time:  Means of arrival:  Comments: 22 yo m, si, lethargic

## 2017-12-24 NOTE — ED Provider Notes (Signed)
Pt was assessed by TTS.  Stable for discharge.   Linwood DibblesKnapp, Chailyn Racette, MD 12/24/17 [phone]

## 2017-12-31 ENCOUNTER — Emergency Department (HOSPITAL_COMMUNITY)
Admission: EM | Admit: 2017-12-31 | Discharge: 2017-12-31 | Disposition: A | Discharge status: Home / Self Care | Payer: Medicaid Other | Attending: Emergency Medicine | Admitting: Emergency Medicine

## 2017-12-31 ENCOUNTER — Encounter (HOSPITAL_COMMUNITY): Payer: Self-pay | Admitting: Emergency Medicine

## 2017-12-31 ENCOUNTER — Other Ambulatory Visit: Payer: Self-pay

## 2017-12-31 DIAGNOSIS — F319 Bipolar disorder, unspecified: Secondary | ICD-10-CM | POA: Insufficient documentation

## 2017-12-31 DIAGNOSIS — F17210 Nicotine dependence, cigarettes, uncomplicated: Secondary | ICD-10-CM | POA: Diagnosis not present

## 2017-12-31 DIAGNOSIS — R339 Retention of urine, unspecified: Secondary | ICD-10-CM | POA: Diagnosis present

## 2017-12-31 DIAGNOSIS — Z590 Homelessness: Secondary | ICD-10-CM | POA: Diagnosis not present

## 2017-12-31 DIAGNOSIS — F1910 Other psychoactive substance abuse, uncomplicated: Secondary | ICD-10-CM | POA: Insufficient documentation

## 2017-12-31 LAB — URINALYSIS, ROUTINE W REFLEX MICROSCOPIC
BILIRUBIN URINE: NEGATIVE
Bacteria, UA: NONE SEEN
Glucose, UA: >=500 mg/dL — AB
Ketones, ur: 80 mg/dL — AB
Leukocytes, UA: NEGATIVE
Nitrite: NEGATIVE
Protein, ur: 30 mg/dL — AB
SPECIFIC GRAVITY, URINE: 1.023 (ref 1.005–1.030)
pH: 5 (ref 5.0–8.0)

## 2017-12-31 LAB — BASIC METABOLIC PANEL
Anion gap: 9 (ref 5–15)
BUN: 11 mg/dL (ref 6–20)
CALCIUM: 9.5 mg/dL (ref 8.9–10.3)
CHLORIDE: 103 mmol/L (ref 101–111)
CO2: 28 mmol/L (ref 22–32)
CREATININE: 0.88 mg/dL (ref 0.61–1.24)
Glucose, Bld: 135 mg/dL — ABNORMAL HIGH (ref 65–99)
Potassium: 3.4 mmol/L — ABNORMAL LOW (ref 3.5–5.1)
SODIUM: 140 mmol/L (ref 135–145)

## 2017-12-31 LAB — CBC
HCT: 42 % (ref 39.0–52.0)
Hemoglobin: 14.5 g/dL (ref 13.0–17.0)
MCH: 31.7 pg (ref 26.0–34.0)
MCHC: 34.5 g/dL (ref 30.0–36.0)
MCV: 91.9 fL (ref 78.0–100.0)
PLATELETS: 193 10*3/uL (ref 150–400)
RBC: 4.57 MIL/uL (ref 4.22–5.81)
RDW: 13 % (ref 11.5–15.5)
WBC: 7.8 10*3/uL (ref 4.0–10.5)

## 2017-12-31 MED ORDER — POTASSIUM CHLORIDE CRYS ER 20 MEQ PO TBCR
40.0000 meq | EXTENDED_RELEASE_TABLET | Freq: Once | ORAL | Status: AC
  Administered 2017-12-31: 40 meq via ORAL
  Filled 2017-12-31: qty 2
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
## 2017-12-31 NOTE — Discharge Instructions (Signed)
It was our pleasure to provide your ER care today - we hope that you feel better.  Use resource guide provided for community social support services as well as substance abuse treatment options.   Also follow up with primary care doctor in the coming week.  If mental health issues and/or crisis, [facility]  Return to ER if worse, new symptoms, fevers, trouble breathing, other concern.

## 2017-12-31 NOTE — ED Notes (Signed)
Pt very groggy and difficult to wake up. Pt encouraged to stay awake. Pt provided graham crackers, apple sauce, and juice.

## 2017-12-31 NOTE — ED Triage Notes (Signed)
Patient has been using heroin all day. Patient woke up 2 hours ago and stated he has trouble urinating.

## 2017-12-31 NOTE — ED Notes (Signed)
Pt made an unsuccessful attempt at voiding.

## 2018-01-01 ENCOUNTER — Other Ambulatory Visit: Payer: Self-pay

## 2018-01-01 ENCOUNTER — Encounter (HOSPITAL_COMMUNITY): Payer: Self-pay | Admitting: Emergency Medicine

## 2018-01-01 ENCOUNTER — Emergency Department (HOSPITAL_COMMUNITY)
Admission: EM | Admit: 2018-01-01 | Discharge: 2018-01-01 | Discharge status: Against Medical Advice | Payer: Medicaid Other | Attending: Emergency Medicine | Admitting: Emergency Medicine

## 2018-01-01 DIAGNOSIS — R600 Localized edema: Secondary | ICD-10-CM | POA: Diagnosis present

## 2018-01-01 DIAGNOSIS — Z5321 Procedure and treatment not carried out due to patient leaving prior to being seen by health care provider: Secondary | ICD-10-CM | POA: Insufficient documentation

## 2018-01-01 LAB — COMPREHENSIVE METABOLIC PANEL
ALT: 20 U/L (ref 17–63)
AST: 19 U/L (ref 15–41)
Albumin: 4.3 g/dL (ref 3.5–5.0)
Alkaline Phosphatase: 60 U/L (ref 38–126)
Anion gap: 12 (ref 5–15)
BUN: 11 mg/dL (ref 6–20)
CHLORIDE: 103 mmol/L (ref 101–111)
CO2: 23 mmol/L (ref 22–32)
CREATININE: 0.87 mg/dL (ref 0.61–1.24)
Calcium: 9.5 mg/dL (ref 8.9–10.3)
GFR calc Af Amer: >60 mL/min (ref >60)
GFR calc non Af Amer: >60 mL/min (ref >60)
Glucose, Bld: 136 mg/dL — ABNORMAL HIGH (ref 65–99)
Potassium: 3.7 mmol/L (ref 3.5–5.1)
SODIUM: 138 mmol/L (ref 135–145)
Total Bilirubin: 0.7 mg/dL (ref 0.3–1.2)
Total Protein: 7.7 g/dL (ref 6.5–8.1)

## 2018-01-01 LAB — CBC WITH DIFFERENTIAL/PLATELET
Basophils Absolute: 0 10*3/uL (ref 0.0–0.1)
Basophils Relative: 0 %
EOS ABS: 0.2 10*3/uL (ref 0.0–0.7)
EOS PCT: 2 %
HCT: 45.2 % (ref 39.0–52.0)
HEMOGLOBIN: 15.5 g/dL (ref 13.0–17.0)
LYMPHS ABS: 0.9 10*3/uL (ref 0.7–4.0)
Lymphocytes Relative: 10 %
MCH: 31.7 pg (ref 26.0–34.0)
MCHC: 34.3 g/dL (ref 30.0–36.0)
MCV: 92.4 fL (ref 78.0–100.0)
Monocytes Absolute: 0.8 10*3/uL (ref 0.1–1.0)
Monocytes Relative: 8 %
Neutro Abs: 7.6 10*3/uL (ref 1.7–7.7)
Neutrophils Relative %: 80 %
PLATELETS: 185 10*3/uL (ref 150–400)
RBC: 4.89 MIL/uL (ref 4.22–5.81)
RDW: 13.1 % (ref 11.5–15.5)
WBC: 9.5 10*3/uL (ref 4.0–10.5)

## 2018-01-01 LAB — I-STAT CG4 LACTIC ACID, ED: LACTIC ACID, VENOUS: 1.38 mmol/L (ref 0.5–1.9)

## 2018-01-01 LAB — CBG MONITORING, ED: Glucose-Capillary: 121 mg/dL — ABNORMAL HIGH (ref 65–99)
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
## 2018-01-01 NOTE — ED Notes (Signed)
Patient called x2 no answer. 

## 2018-01-01 NOTE — ED Triage Notes (Signed)
Per EMS pt from the street with redness, swelling, and pain around recent drug use site. Pt here yesterday with difficulty urinating and did not mention this issue to the doctor. Said he left here and did more crack yesterday. Pt affect is flat and slow to answer.

## 2018-01-02 ENCOUNTER — Emergency Department (HOSPITAL_COMMUNITY)
Admission: EM | Admit: 2018-01-02 | Discharge: 2018-01-03 | Disposition: A | Discharge status: Home / Self Care | Payer: Medicaid Other | Attending: Emergency Medicine | Admitting: Emergency Medicine

## 2018-01-02 DIAGNOSIS — F14180 Cocaine abuse with cocaine-induced anxiety disorder: Secondary | ICD-10-CM | POA: Insufficient documentation

## 2018-01-02 DIAGNOSIS — F17210 Nicotine dependence, cigarettes, uncomplicated: Secondary | ICD-10-CM | POA: Insufficient documentation

## 2018-01-02 DIAGNOSIS — F1910 Other psychoactive substance abuse, uncomplicated: Secondary | ICD-10-CM | POA: Diagnosis present

## 2018-01-02 DIAGNOSIS — Z590 Homelessness: Secondary | ICD-10-CM | POA: Diagnosis not present

## 2018-01-02 DIAGNOSIS — M79671 Pain in right foot: Secondary | ICD-10-CM | POA: Insufficient documentation

## 2018-01-02 DIAGNOSIS — R45851 Suicidal ideations: Secondary | ICD-10-CM | POA: Diagnosis not present

## 2018-01-02 NOTE — ED Triage Notes (Signed)
Pt come to ed, via ems, wants to die, was arrested [employer] for stealing a tent, and meds,  Took 15 benadryl. V/s on arrival 110/80, pulse 82, rr18, spo2 98 room air.  abscess right foot injecting drugs.

## 2018-01-03 ENCOUNTER — Emergency Department (HOSPITAL_COMMUNITY): Payer: Medicaid Other

## 2018-01-03 LAB — COMPREHENSIVE METABOLIC PANEL
ALT: 20 U/L (ref 17–63)
ANION GAP: 8 (ref 5–15)
AST: 21 U/L (ref 15–41)
Albumin: 3.6 g/dL (ref 3.5–5.0)
Alkaline Phosphatase: 56 U/L (ref 38–126)
BUN: 11 mg/dL (ref 6–20)
CALCIUM: 9.5 mg/dL (ref 8.9–10.3)
CHLORIDE: 103 mmol/L (ref 101–111)
CO2: 29 mmol/L (ref 22–32)
Creatinine, Ser: 0.9 mg/dL (ref 0.61–1.24)
Glucose, Bld: 115 mg/dL — ABNORMAL HIGH (ref 65–99)
Potassium: 3.8 mmol/L (ref 3.5–5.1)
Sodium: 140 mmol/L (ref 135–145)
Total Bilirubin: 0.4 mg/dL (ref 0.3–1.2)
Total Protein: 6.8 g/dL (ref 6.5–8.1)

## 2018-01-03 LAB — SALICYLATE LEVEL

## 2018-01-03 LAB — RAPID URINE DRUG SCREEN, HOSP PERFORMED
Amphetamines: NOT DETECTED
BARBITURATES: NOT DETECTED
Benzodiazepines: NOT DETECTED
COCAINE: POSITIVE — AB
Opiates: POSITIVE — AB
Tetrahydrocannabinol: NOT DETECTED

## 2018-01-03 LAB — CBC WITH DIFFERENTIAL/PLATELET
Basophils Absolute: 0 10*3/uL (ref 0.0–0.1)
Basophils Relative: 0 %
EOS ABS: 0.3 10*3/uL (ref 0.0–0.7)
EOS PCT: 4 %
HCT: 36 % — ABNORMAL LOW (ref 39.0–52.0)
Hemoglobin: 12.6 g/dL — ABNORMAL LOW (ref 13.0–17.0)
LYMPHS ABS: 2.6 10*3/uL (ref 0.7–4.0)
LYMPHS PCT: 28 %
MCH: 31.7 pg (ref 26.0–34.0)
MCHC: 35 g/dL (ref 30.0–36.0)
MCV: 90.5 fL (ref 78.0–100.0)
MONO ABS: 0.9 10*3/uL (ref 0.1–1.0)
MONOS PCT: 10 %
Neutro Abs: 5.3 10*3/uL (ref 1.7–7.7)
Neutrophils Relative %: 58 %
PLATELETS: 199 10*3/uL (ref 150–400)
RBC: 3.98 MIL/uL — ABNORMAL LOW (ref 4.22–5.81)
RDW: 12.7 % (ref 11.5–15.5)
WBC: 9.1 10*3/uL (ref 4.0–10.5)

## 2018-01-03 LAB — ACETAMINOPHEN LEVEL

## 2018-01-03 LAB — ETHANOL

## 2018-01-03 MED ORDER — ALUM & MAG HYDROXIDE-SIMETH 200-200-20 MG/5ML PO SUSP: 30.0000 mL | Freq: Four times a day (QID) | ORAL | Status: DC | PRN

## 2018-01-03 MED ORDER — ONDANSETRON HCL 4 MG PO TABS: 4.0000 mg | ORAL_TABLET | Freq: Three times a day (TID) | ORAL | Status: DC | PRN

## 2018-01-03 MED ORDER — IBUPROFEN 200 MG PO TABS: 600.0000 mg | ORAL_TABLET | Freq: Three times a day (TID) | ORAL | Status: DC | PRN

## 2018-01-03 MED ORDER — NICOTINE 7 MG/24HR TD PT24: 7.0000 mg | MEDICATED_PATCH | Freq: Every day | TRANSDERMAL | Status: DC

## 2018-01-03 NOTE — ED Notes (Addendum)
Pt A&O x 3, calm & cooperative, no acute distress noted, presents with SI, ingested 15 Benadryl, arrested by [name] who brought pt in for stealing a tent and medications.  Complaint of right foot pain, pt given post op shoe for rt foot in main ed.  Denies HI or AVH.  Monitoring for safety, Q 15 min checks in effect.
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
## 2018-01-03 NOTE — Progress Notes (Addendum)
Client was in the process of being arrested and told them he overdosed on Benadryl but not sleeping or any other signs.  He was using cocaine and opiates.  Medically cleared by the EDP.  [name] called and he can return to jail.  No suicidal/homicidal ideations, hallucinations, or withdrawal symptoms.  STable for discharge.  Nanine MeansJamison Lord, PMHNP

## 2018-01-03 NOTE — BH Assessment (Addendum)
Assessment Note  Thomas [name] is an 22 y.o. male, who presents voluntary and accompanied by a nurse tech. Clinician asked the pt, "what brought you to the hospital?" Pt reported, "I have an abscess on my foot." Pt reported, "I took 30 Benadryl's earlier today and called 911." Pt reported, he took the Benadryl as a suicide attempt. Clinician asked the pt if he was suicidal." Pt reported, "no...yes." Clinician asked the pt again if he was suicidal. Pt reported, yes. Pt denies, HI, AVH, self-injurious behaviors and access to weapons.   Pt denies abuse. Pt reported, using "a little" heroin today. Pt reported, using a bag a day of cocaine. Pt's UDS is positive for opiates and cocaine. Pt reported, the abscess on his right foot is from injecting himself. Pt is linked to [facility] for medication management and counseling. Pt reported, he met with a worker today who provided him his medications. Pt could not recall the names of his medications. Pt reported, he does not take his medications as prescribed. Pt reports, previous inpatient admissions.    Pt presents, disheveled in scrubs with slow, slurred speech. Pt's eye contact was poor. Pt's mood was depressed. Pt's affect was flat. Pt's thought process was circumstantial. Pt's judgement was impaired. Pt was oriented x4. Pt's concentration was fair. Pt's insight and impulse control are poor.  Clinician asked if discharged from [facility] if he could contract for safety. Pt reported, "yes...no." Clinician asked the pt again if he could contract for safety outside of [place]. Pt reported, "no." Pt reported, if inpatient treatment was recommended he will sign-in voluntarily.   Diagnosis: F31.75 Bipolar I Disorder, current or most recent episode depressed.                    F11.20 Opioid use disorder, severe.                    F14.10      Cocaine use Disorder, severe.  Past Medical History:  Past Medical History:  Diagnosis Date  . ADHD (attention deficit  hyperactivity disorder)   . Anxiety   . Bipolar 1 disorder ([place])   . Current smoker   . Depression   . Eating disorder   . Headache(784.0)   . History of ADHD 11/01/2015  . Medical history non-contributory   . Mental disorder   . Nicotine dependence 11/03/2015  . 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 Schizophrenia ([place])     No past surgical history on file.  Family History: No family history on file.  Social History:  reports that he has been smoking cigarettes.  He has been smoking about 0.00 packs per day for the past 4.00 years. He has never used smokeless tobacco. He reports that he drinks alcohol. He reports that he has current or past drug history. Drugs: Marijuana, Cocaine, LSD, MDMA (Ecstacy), and Methamphetamines.  Additional Social History:  Alcohol / Drug Use Pain Medications: See MAR Prescriptions: See MAR History of alcohol / drug use?: Yes Substance #1 Name of Substance 1: Heroin (Opiates) 1 - Age of First Use: UTA 1 - Amount (size/oz): Pt reported, using a little.  1 - Frequency: UTA 1 - Duration: Ongoing.  1 - Last Use / Amount: Pt reported, today.  Substance #2 Name of Substance 2: Cocaine. 2 - Age of First Use: UTA 2 - Amount (size/oz): Pt reported, using a bag this morning.  2 - Frequency: Ongoing.  2 - Duration: UTA  2 - Last Use / Amount: Pt reported, this morning.   CIWA: CIWA-Ar BP: 122/74 Pulse Rate: 89 COWS:    Allergies: No Known Allergies  Home Medications:  (Not in a hospital admission)  OB/GYN Status:  No LMP for male patient.  General Assessment Data Location of Assessment: [facility] TTS Assessment: In system Is this a Tele or Face-to-Face Assessment?: Face-to-Face Is this an Initial Assessment or a Re-assessment for this encounter?: Initial Assessment Marital status: Single Living Arrangements: Other (Comment)(Homeless. ) Can pt return to current living arrangement?: Yes Admission Status: Voluntary Is  patient capable of signing voluntary admission?: Yes Referral Source: Self/Family/Friend Insurance type: Medicaid.     Crisis Care Plan Living Arrangements: Other (Comment)(Homeless. ) Legal Guardian: Other:(Self. ) Name of Psychiatrist: [facility] Name of Therapist: [facility]  Education Status Is patient currently in school?: No Current Grade: NA Highest grade of school patient has completed: GED Name of school: NA Contact person: NA Is the patient employed, unemployed or receiving disability?: Unemployed  Risk to self with the past 6 months Suicidal Ideation: Yes-Currently Present Has patient been a risk to self within the past 6 months prior to admission? : Yes Suicidal Intent: Yes-Currently Present Has patient had any suicidal intent within the past 6 months prior to admission? : Yes Is patient at risk for suicide?: Yes Suicidal Plan?: Yes-Currently Present Has patient had any suicidal plan within the past 6 months prior to admission? : Yes Specify Current Suicidal Plan: Pt reported, he took 30 Benadryl tablets before coming to [facility] as a suicide attempt.  Access to Means: Yes Specify Access to Suicidal Means: Pt has access to Benadryl.  What has been your use of drugs/alcohol within the last 12 months?: Heroin and cocaine.  Previous Attempts/Gestures: Yes How many times?: 1 Other Self Harm Risks: Pt denies.  Triggers for Past Attempts: Unknown Intentional Self Injurious Behavior: None(Pt denies. ) Family Suicide History: Yes(Pt reported, grandfather. ) Recent stressful life event(s): Other (Comment)(Homelessnes, drug use. ) Persecutory voices/beliefs?: No Depression: No(Pt denies. ) Depression Symptoms: (Pt denies. ) Substance abuse history and/or treatment for substance abuse?: Yes Suicide prevention information given to non-admitted patients: Not applicable  Risk to Others within the past 6 months Homicidal Ideation: No(Pt denies. ) Does patient have any lifetime risk  of violence toward others beyond the six months prior to admission? : No Thoughts of Harm to Others: No Current Homicidal Intent: No Current Homicidal Plan: No Access to Homicidal Means: No Identified Victim: NA History of harm to others?: No Assessment of Violence: None Noted Violent Behavior Description: NA Does patient have access to weapons?: No(Pt denies. ) Criminal Charges Pending?: Yes Describe Pending Criminal Charges: Larceny Does patient have a court date: Yes Court Date: 01/17/18 Is patient on probation?: No  Psychosis Hallucinations: None noted Delusions: None noted  Mental Status Report Appearance/Hygiene: In scrubs, Disheveled Eye Contact: Poor Motor Activity: Unremarkable Speech: Slow, Slurred Level of Consciousness: Drowsy Mood: Depressed Affect: Flat Anxiety Level: Minimal Thought Processes: Circumstantial Judgement: Impaired Orientation: Person, Place, Time, Situation Obsessive Compulsive Thoughts/Behaviors: None  Cognitive Functioning Concentration: Fair Memory: Recent Intact Is patient IDD: No Is patient DD?: No Insight: Poor Impulse Control: Poor Appetite: Fair Sleep: Increased Total Hours of Sleep: (8-12 hours. ) Vegetative Symptoms: Unable to Assess  ADLScreening [facility] Assessment Services) Patient's cognitive ability adequate to safely complete daily activities?: Yes Patient able to express need for assistance with ADLs?: Yes Independently performs ADLs?: Yes (appropriate for developmental age)  Prior Inpatient Therapy Prior Inpatient Therapy: Yes Prior Therapy Dates: 2019 Prior Therapy Facilty/Provider(s): [facility] Reason for Treatment: MH issues  Prior Outpatient Therapy Prior Outpatient Therapy: Yes Prior Therapy Dates: Current Prior Therapy Facilty/Provider(s): [facility] Reason for Treatment: Medication management and counseling.  Does patient have an ACCT team?: Yes Does patient have Intensive In-House Services?  : No Does patient  have [facility] services? : No Does patient have P4CC services?: No  ADL Screening (condition at time of admission) Patient's cognitive ability adequate to safely complete daily activities?: Yes Is the patient deaf or have difficulty hearing?: No Does the patient have difficulty seeing, even when wearing glasses/contacts?: No Does the patient have difficulty concentrating, remembering, or making decisions?: Yes Patient able to express need for assistance with ADLs?: Yes Does the patient have difficulty dressing or bathing?: No Independently performs ADLs?: Yes (appropriate for developmental age) Does the patient have difficulty walking or climbing stairs?: No Weakness of Legs: Right(Pt reported, an absess on his right foot. ) Weakness of Arms/Hands: None  Home Assistive Devices/Equipment Home Assistive Devices/Equipment: None    Abuse/Neglect Assessment (Assessment to be complete while patient is alone) Abuse/Neglect Assessment Can Be Completed: Yes Physical Abuse: Denies(Pt denies.) Verbal Abuse: Denies(Pt denies. ) Sexual Abuse: Denies(Pt denies. ) Exploitation of patient/patient's resources: Denies(Pt denies. ) Self-Neglect: Denies(Pt denies.)     Advance Directives (For Healthcare) Does Patient Have a Medical Advance Directive?: No Would patient like information on creating a medical advance directive?: No - Patient declined    Additional Information 1:1 In Past 12 Months?: No CIRT Risk: No Elopement Risk: No Does patient have medical clearance?: Yes     Disposition: Patriciaann Clan, PA recommends inpatient treatment. Disposition discussed with Dr. Roxanne Mins and Roderic Palau, RN. TTS to seek placement.    Disposition Initial Assessment Completed for this Encounter: Yes Patient refused recommended treatment: No Mode of transportation if patient is discharged?: N/A Patient referred to: Other (Comment)(inpatient treatment.)  On Site Evaluation by:  Alyson Ingles. Alnisa Hasley, MS, LPC,  CRC. Reviewed with Physician:  Dr. Roxanne Mins and Patriciaann Clan, PA.  Vertell Novak 01/03/2018 5:55 AM   Vertell Novak, MS, [facility], [facility] Triage Specialist [phone]
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
## 2018-01-06 ENCOUNTER — Observation Stay (HOSPITAL_COMMUNITY)
Admission: EM | Admit: 2018-01-06 | Discharge: 2018-01-07 | Disposition: A | Discharge status: Home / Self Care | Payer: Medicaid Other | Attending: Internal Medicine | Admitting: Internal Medicine

## 2018-01-06 ENCOUNTER — Other Ambulatory Visit: Payer: Self-pay

## 2018-01-06 ENCOUNTER — Observation Stay (HOSPITAL_BASED_OUTPATIENT_CLINIC_OR_DEPARTMENT_OTHER)
Admit: 2018-01-06 | Discharge: 2018-01-06 | Disposition: A | Discharge status: Home / Self Care | Payer: Medicaid Other | Attending: Internal Medicine | Admitting: Internal Medicine

## 2018-01-06 ENCOUNTER — Emergency Department (HOSPITAL_COMMUNITY): Payer: Medicaid Other

## 2018-01-06 ENCOUNTER — Encounter (HOSPITAL_COMMUNITY): Payer: Self-pay

## 2018-01-06 DIAGNOSIS — F909 Attention-deficit hyperactivity disorder, unspecified type: Secondary | ICD-10-CM | POA: Diagnosis not present

## 2018-01-06 DIAGNOSIS — X58XXXA Exposure to other specified factors, initial encounter: Secondary | ICD-10-CM | POA: Insufficient documentation

## 2018-01-06 DIAGNOSIS — F17210 Nicotine dependence, cigarettes, uncomplicated: Secondary | ICD-10-CM | POA: Diagnosis not present

## 2018-01-06 DIAGNOSIS — Z590 Homelessness: Secondary | ICD-10-CM | POA: Insufficient documentation

## 2018-01-06 DIAGNOSIS — M7989 Other specified soft tissue disorders: Secondary | ICD-10-CM | POA: Diagnosis not present

## 2018-01-06 DIAGNOSIS — F419 Anxiety disorder, unspecified: Secondary | ICD-10-CM | POA: Diagnosis not present

## 2018-01-06 DIAGNOSIS — G9340 Encephalopathy, unspecified: Secondary | ICD-10-CM | POA: Insufficient documentation

## 2018-01-06 DIAGNOSIS — M609 Myositis, unspecified: Secondary | ICD-10-CM

## 2018-01-06 DIAGNOSIS — T797XXA Traumatic subcutaneous emphysema, initial encounter: Secondary | ICD-10-CM | POA: Insufficient documentation

## 2018-01-06 DIAGNOSIS — Z79899 Other long term (current) drug therapy: Secondary | ICD-10-CM | POA: Diagnosis not present

## 2018-01-06 DIAGNOSIS — R000 Tachycardia, unspecified: Secondary | ICD-10-CM | POA: Diagnosis not present

## 2018-01-06 DIAGNOSIS — L089 Local infection of the skin and subcutaneous tissue, unspecified: Secondary | ICD-10-CM

## 2018-01-06 DIAGNOSIS — F259 Schizoaffective disorder, unspecified: Secondary | ICD-10-CM | POA: Diagnosis not present

## 2018-01-06 DIAGNOSIS — L03113 Cellulitis of right upper limb: Secondary | ICD-10-CM | POA: Diagnosis present

## 2018-01-06 DIAGNOSIS — F1990 Other psychoactive substance use, unspecified, uncomplicated: Secondary | ICD-10-CM

## 2018-01-06 DIAGNOSIS — F14259 Cocaine dependence with cocaine-induced psychotic disorder, unspecified: Secondary | ICD-10-CM | POA: Insufficient documentation

## 2018-01-06 DIAGNOSIS — M2550 Pain in unspecified joint: Secondary | ICD-10-CM | POA: Diagnosis not present

## 2018-01-06 DIAGNOSIS — F14150 Cocaine abuse with cocaine-induced psychotic disorder with delusions: Secondary | ICD-10-CM | POA: Diagnosis present

## 2018-01-06 DIAGNOSIS — F319 Bipolar disorder, unspecified: Secondary | ICD-10-CM | POA: Insufficient documentation

## 2018-01-06 LAB — CBC WITH DIFFERENTIAL/PLATELET
BASOS ABS: 0 10*3/uL (ref 0.0–0.1)
Basophils Relative: 0 %
EOS ABS: 0.2 10*3/uL (ref 0.0–0.7)
EOS PCT: 1 %
HCT: 39.6 % (ref 39.0–52.0)
Hemoglobin: 13.2 g/dL (ref 13.0–17.0)
LYMPHS ABS: 1.5 10*3/uL (ref 0.7–4.0)
Lymphocytes Relative: 13 %
MCH: 30.8 pg (ref 26.0–34.0)
MCHC: 33.3 g/dL (ref 30.0–36.0)
MCV: 92.3 fL (ref 78.0–100.0)
MONO ABS: 1 10*3/uL (ref 0.1–1.0)
Monocytes Relative: 9 %
Neutro Abs: 8.5 10*3/uL — ABNORMAL HIGH (ref 1.7–7.7)
Neutrophils Relative %: 77 %
PLATELETS: 240 10*3/uL (ref 150–400)
RBC: 4.29 MIL/uL (ref 4.22–5.81)
RDW: 12.9 % (ref 11.5–15.5)
WBC: 11.1 10*3/uL — AB (ref 4.0–10.5)

## 2018-01-06 LAB — BASIC METABOLIC PANEL
Anion gap: 8 (ref 5–15)
BUN: 12 mg/dL (ref 6–20)
CO2: 25 mmol/L (ref 22–32)
CREATININE: 0.91 mg/dL (ref 0.61–1.24)
Calcium: 8.7 mg/dL — ABNORMAL LOW (ref 8.9–10.3)
Chloride: 105 mmol/L (ref 101–111)
GFR calc Af Amer: >60 mL/min (ref >60)
GLUCOSE: 109 mg/dL — AB (ref 65–99)
Potassium: 4 mmol/L (ref 3.5–5.1)
SODIUM: 138 mmol/L (ref 135–145)

## 2018-01-06 LAB — CBC
HEMATOCRIT: 40.9 % (ref 39.0–52.0)
HEMOGLOBIN: 13.8 g/dL (ref 13.0–17.0)
MCH: 31.5 pg (ref 26.0–34.0)
MCHC: 33.7 g/dL (ref 30.0–36.0)
MCV: 93.4 fL (ref 78.0–100.0)
Platelets: 216 10*3/uL (ref 150–400)
RBC: 4.38 MIL/uL (ref 4.22–5.81)
RDW: 12.9 % (ref 11.5–15.5)
WBC: 9.7 10*3/uL (ref 4.0–10.5)

## 2018-01-06 LAB — CK: CK TOTAL: 77 U/L (ref 49–397)

## 2018-01-06 LAB — CREATININE, SERUM: Creatinine, Ser: 0.9 mg/dL (ref 0.61–1.24)

## 2018-01-06 LAB — RAPID URINE DRUG SCREEN, HOSP PERFORMED
AMPHETAMINES: NOT DETECTED
Barbiturates: NOT DETECTED
Benzodiazepines: NOT DETECTED
Cocaine: POSITIVE — AB
OPIATES: POSITIVE — AB
Tetrahydrocannabinol: POSITIVE — AB

## 2018-01-06 LAB — I-STAT CG4 LACTIC ACID, ED: LACTIC ACID, VENOUS: 0.75 mmol/L (ref 0.5–1.9)

## 2018-01-06 LAB — ETHANOL: Alcohol, Ethyl (B): <10 mg/dL (ref <10)

## 2018-01-06 MED ORDER — ONDANSETRON HCL 4 MG PO TABS: 4.0000 mg | ORAL_TABLET | Freq: Four times a day (QID) | ORAL | Status: DC | PRN

## 2018-01-06 MED ORDER — SODIUM CHLORIDE 0.9 % IV SOLN
INTRAVENOUS | Status: DC
  Administered 2018-01-07: 09:00:00 via INTRAVENOUS

## 2018-01-06 MED ORDER — HEPARIN SODIUM (PORCINE) 5000 UNIT/ML IJ SOLN
5000.0000 [IU] | Freq: Three times a day (TID) | INTRAMUSCULAR | Status: DC
  Administered 2018-01-06 – 2018-01-07 (×2): 5000 [IU] via SUBCUTANEOUS
  Filled 2018-01-06 (×2): qty 1

## 2018-01-06 MED ORDER — IBUPROFEN 200 MG PO TABS: 400.0000 mg | ORAL_TABLET | Freq: Four times a day (QID) | ORAL | Status: DC | PRN

## 2018-01-06 MED ORDER — ACETAMINOPHEN 325 MG PO TABS: 650.0000 mg | ORAL_TABLET | Freq: Four times a day (QID) | ORAL | Status: DC | PRN

## 2018-01-06 MED ORDER — ACETAMINOPHEN 650 MG RE SUPP: 650.0000 mg | Freq: Four times a day (QID) | RECTAL | Status: DC | PRN

## 2018-01-06 MED ORDER — SODIUM CHLORIDE 0.9 % IV BOLUS (SEPSIS)
1000.0000 mL | Freq: Once | INTRAVENOUS | Status: AC
  Administered 2018-01-06: 1000 mL via INTRAVENOUS

## 2018-01-06 MED ORDER — IOPAMIDOL (ISOVUE-300) INJECTION 61%
100.0000 mL | Freq: Once | INTRAVENOUS | Status: AC | PRN
  Administered 2018-01-06: 100 mL via INTRAVENOUS

## 2018-01-06 MED ORDER — VANCOMYCIN HCL IN DEXTROSE 1-5 GM/200ML-% IV SOLN
1000.0000 mg | Freq: Once | INTRAVENOUS | Status: AC
  Administered 2018-01-06: 1000 mg via INTRAVENOUS
  Filled 2018-01-06: qty 200

## 2018-01-06 MED ORDER — KETOROLAC TROMETHAMINE 30 MG/ML IJ SOLN
30.0000 mg | Freq: Four times a day (QID) | INTRAMUSCULAR | Status: DC | PRN
  Administered 2018-01-06 – 2018-01-07 (×2): 30 mg via INTRAVENOUS
  Filled 2018-01-06 (×2): qty 1

## 2018-01-06 MED ORDER — ONDANSETRON HCL 4 MG/2ML IJ SOLN: 4.0000 mg | Freq: Four times a day (QID) | INTRAMUSCULAR | Status: DC | PRN

## 2018-01-06 MED ORDER — CLINDAMYCIN PHOSPHATE 600 MG/50ML IV SOLN
600.0000 mg | Freq: Three times a day (TID) | INTRAVENOUS | Status: DC
  Administered 2018-01-06 – 2018-01-07 (×3): 600 mg via INTRAVENOUS
  Filled 2018-01-06 (×4): qty 50

## 2018-01-06 MED ORDER — IOPAMIDOL (ISOVUE-300) INJECTION 61%
INTRAVENOUS | Status: AC
  Filled 2018-01-06: qty 100

## 2018-01-06 NOTE — ED Notes (Signed)
Pharmacy called for Vanc order.

## 2018-01-06 NOTE — Progress Notes (Signed)
Right upper extremity venous duplex completed. No evidence of DVT or superficial thrombosis. [place]ia Jeronica Stlouis, RVS 01/06/2018, 1:01 PM

## 2018-01-06 NOTE — H&P (Signed)
History and Physical    Thomas [name] ZOX:[id number] DOB: Jun 06, 1996 DOA: 01/06/2018  PCP: [facility] Patient coming from: [facility] room  Chief Complaint: Right arm swelling and pain  HPI: Thomas [name] is a 22 y.o. male with medical history significant of history of injectable cocaine abuse, polysubstance abuse, ADHD, schizophrenia, bipolar disorder 1 was brought to the hospital for evaluation of right arm pain history is very limited due to patient's mentation at this time from cocaine use. Apparently patient has been residing at a hotel in the past 2-3 days where he has noticed right arm pain.  This morning he injected some IV cocaine and noted that his right arm pain was even more swollen and very erythematous therefore called EMS for evaluation.  When EMS arrived he was extremely euphoric and drowsy.  He was noted to have right arm erythema and swelling around his injected site.  In the ER patient's vital signs are stable, initial labs are unremarkable.  He was very drowsy but maintaining his airway.  His right forearm and AC region was quite swollen and erythematous therefore started on IV vancomycin.  CT of the arm was performed which confirmed cellulitis with concerning mild fasciitis but no abscess.  He was admitted to the hospital for closer monitoring of his mentation and IV antibiotics for now.  I spoke with orthopedic-Dr. Aundria Rud about the CT finding who recommends medical management at this time.   Review of Systems: As per HPI otherwise 10 point review of systems negative.  Review of Systems Unable to get full review of systems due to patient mentation  Past Medical History:  Diagnosis Date  . ADHD (attention deficit hyperactivity disorder)   . Anxiety   . Bipolar 1 disorder (HCC)   . Current smoker   . Depression   . Eating disorder   . Headache(784.0)   . History of ADHD 11/01/2015  . Medical history non-contributory   . Mental disorder   .  Nicotine dependence 11/03/2015  . 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 Schizophrenia (HCC)     History reviewed. No pertinent surgical history.  SOCIAL HISTORY:  reports that he has been smoking cigarettes.  He has been smoking about 0.00 packs per day for the past 4.00 years. He has never used smokeless tobacco. He reports that he drinks alcohol. He reports that he has current or past drug history. Drugs: Marijuana, Cocaine, LSD, MDMA (Ecstacy), and Methamphetamines.  No Known Allergies  FAMILY HISTORY: Unable to get full history due to patient's mentation  Prior to Admission medications   Medication Sig Start Date End Date Taking? Authorizing Provider  amphetamine-dextroamphetamine (ADDERALL XR) 15 MG 24 hr capsule Take 15 mg by mouth every morning.   Yes [provider]  paliperidone (INVEGA SUSTENNA) 234 MG/1.5ML SUSP injection Inject 234 mg into the muscle every 30 (thirty) days.   Yes [provider]    Physical Exam: Vitals:   01/06/18 0651 01/06/18 1000  BP: 104/76 105/63  Pulse: 87 82  Resp: 20 16  Temp: 98.4 F (36.9 C) 98.7 F (37.1 C)  TempSrc: Oral Oral  SpO2: 97% 100%      Constitutional: Drowsy secondary to drug overdose, maintaining airway well.  He is easily arousable Eyes: PERRL, lids and conjunctivae normal ENMT: Mucous membranes are moist. Posterior pharynx clear of any exudate or lesions.Normal dentition.  Neck: normal, supple, no masses, no thyromegaly Respiratory: clear to auscultation bilaterally, no wheezing, no crackles. Normal respiratory  effort. No accessory muscle use.  Cardiovascular: Regular rate and rhythm, no murmurs / rubs / gallops. No extremity edema. 2+ pedal pulses. No carotid bruits.  Abdomen: no tenderness, no masses palpated. No hepatosplenomegaly. Bowel sounds positive.  Musculoskeletal: no clubbing / cyanosis. No joint deformity upper and lower extremities. Good ROM, no contractures.  Normal muscle tone.  Skin: Right forearm needle mark noted in the [facility] area with surrounding erythema swelling.  Area is demarcated with surgical marker Neurologic: Unable to fully assess but grossly moves all the extremities Psychiatric: Unable to fully assess    Labs on Admission: I have personally reviewed following labs and imaging studies  CBC: Recent Labs  Lab 12/31/17 0423 01/01/18 1235 01/03/18 0043 01/06/18 0920  WBC 7.8 9.5 9.1 11.1*  NEUTROABS  --  7.6 5.3 8.5*  HGB 14.5 15.5 12.6* 13.2  HCT 42.0 45.2 36.0* 39.6  MCV 91.9 92.4 90.5 92.3  PLT 193 185 199 240   Basic Metabolic Panel: Recent Labs  Lab 12/31/17 0423 01/01/18 1235 01/03/18 0043 01/06/18 0920  NA 140 138 140 138  K 3.4* 3.7 3.8 4.0  CL 103 103 103 105  CO2 28 23 29 25   GLUCOSE 135* 136* 115* 109*  BUN 11 11 11 12   CREATININE 0.88 0.87 0.90 0.91  CALCIUM 9.5 9.5 9.5 8.7*   GFR: Estimated Creatinine Clearance: 117.8 mL/min (by C-G formula based on SCr of 0.91 mg/dL). Liver Function Tests: Recent Labs  Lab 01/01/18 1235 01/03/18 0043  AST 19 21  ALT 20 20  ALKPHOS 60 56  BILITOT 0.7 0.4  PROT 7.7 6.8  ALBUMIN 4.3 3.6   No results for input(s): LIPASE, AMYLASE in the last 168 hours. No results for input(s): AMMONIA in the last 168 hours. Coagulation Profile: No results for input(s): INR, PROTIME in the last 168 hours. Cardiac Enzymes: Recent Labs  Lab 01/06/18 0917  CKTOTAL 77   BNP (last 3 results) No results for input(s): PROBNP in the last 8760 hours. HbA1C: No results for input(s): HGBA1C in the last 72 hours. CBG: Recent Labs  Lab 01/01/18 1141  GLUCAP 121*   Lipid Profile: No results for input(s): CHOL, HDL, LDLCALC, TRIG, CHOLHDL, LDLDIRECT in the last 72 hours. Thyroid Function Tests: No results for input(s): TSH, T4TOTAL, FREET4, T3FREE, THYROIDAB in the last 72 hours. Anemia Panel: No results for input(s): VITAMINB12, FOLATE, FERRITIN, TIBC, IRON, RETICCTPCT in  the last 72 hours. Urine analysis:    Component Value Date/Time   COLORURINE YELLOW 12/31/2017 0423   APPEARANCEUR CLEAR 12/31/2017 0423   LABSPEC 1.023 12/31/2017 0423   PHURINE 5.0 12/31/2017 0423   GLUCOSEU >=500 (A) 12/31/2017 0423   HGBUR SMALL (A) 12/31/2017 0423   BILIRUBINUR NEGATIVE 12/31/2017 0423   KETONESUR 80 (A) 12/31/2017 0423   PROTEINUR 30 (A) 12/31/2017 0423   UROBILINOGEN 0.2 02/19/2015 1954   NITRITE NEGATIVE 12/31/2017 0423   LEUKOCYTESUR NEGATIVE 12/31/2017 0423   Sepsis Labs: !!!!!!!!!!!!!!!!!!!!!!!!!!!!!!!!!!!!!!!!!!!! @LABRCNTIP (procalcitonin:4,lacticidven:4) )No results found for this or any previous visit (from the past 240 hour(s)).   Radiological Exams on Admission: Dg Chest 2 View  Result Date: 01/06/2018 CLINICAL DATA:  Redness and swelling EXAM: CHEST - 2 VIEW COMPARISON:  December 13, 2017 FINDINGS: The heart size and mediastinal contours are within normal limits. Both lungs are clear. The visualized skeletal structures are unremarkable. IMPRESSION: No active cardiopulmonary disease. Electronically Signed   By: Gerome Sam [name] M.D   On: 01/06/2018 10:22   Dg Elbow Complete Right  Result Date: 01/06/2018 CLINICAL DATA:  Soft tissue swelling and redness. EXAM: RIGHT ELBOW - COMPLETE 3+ VIEW COMPARISON:  None. FINDINGS: Soft tissue edema. No bony abnormality. No fracture or bony erosion. IMPRESSION: No bony abnormality. Electronically Signed   By: Gerome Samavid  Williams [name] M.D   On: 01/06/2018 10:21   Dg Humerus Right  Result Date: 01/06/2018 CLINICAL DATA:  Red swollen upper extremity. Wound antecubital region. EXAM: RIGHT HUMERUS - 2+ VIEW COMPARISON:  None. FINDINGS: Soft tissue edema identified, consistent with history. No fracture or dislocation. No bony lesion or evidence of osteomyelitis. IMPRESSION: No bony abnormality identified. Electronically Signed   By: Gerome Samavid  Williams [name] M.D   On: 01/06/2018 10:20   Ct Extrem Up Entire Arm R W/cm  Result  Date: 01/06/2018 CLINICAL DATA:  Right upper extremity pain and swelling for 3 days. Intravenous drug abuse. Soft tissue infection. EXAM: CT OF THE UPPER RIGHT EXTREMITY WITH CONTRAST TECHNIQUE: Multidetector CT imaging of the upper right extremity was performed according to the standard protocol following intravenous contrast administration. COMPARISON:  Radiographs same date. CONTRAST:  100mL ISOVUE-300 IOPAMIDOL (ISOVUE-300) INJECTION 61% FINDINGS: Bones/Joint/Cartilage The entire right upper extremity was imaged, extending from the shoulder through the fingers. No osseous abnormalities are identified. Specifically, no evidence bone destruction, acute fracture or dislocation. No significant effusions are seen at the shoulder, elbow or wrist. Ligaments Suboptimally assessed by CT. Muscles and Tendons There are multiple air bubbles anteriorly within the soft tissues of the upper arm and proximal forearm. Some of these air bubbles are located deep to the biceps and brachialis musculature. There is mild intermuscular edema without drainable fluid collection. Soft tissues As above, soft tissue emphysema within the anterior upper arm and proximal forearm. There is diffuse soft tissue stranding in the subcutaneous fat consistent with cellulitis. No drainable abscess. No large vessel occlusion. Possible superficial thrombophlebitis involving the cephalic vein near the antecubital fossa. Prominent lymph nodes in the right axilla, likely reactive. IMPRESSION: 1. Findings are consistent with cellulitis and probable myofasciitis involving the anterior upper arm and proximal forearm. Associated scattered air bubbles, but no drainable fluid collection. 2. Possible superficial thrombophlebitis involving the cephalic vein. 3. No evidence of osteomyelitis or septic joint. Electronically Signed   By: Carey BullocksWilliam  Veazey M.D.   On: 01/06/2018 11:43     All images have been reviewed by me personally.    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)   Myofasciitis   Cellulitis of arm, right    Moderate right  arm cellulitis with possible mild fasciitis Superficial thrombophlebitis - Admit the patient for observation -Cultures have been done -CT of the arm shows right arm cellulitis with probable mild fasciitis in anterior upper and proximal forearm without any drainable abscess spoke with orthopedic and recommends medical management -We will start patient on IV clindamycin -Provide supportive care, warm, pressors as needed, IV fluids -Pain control, avoid narcotics  Acute metabolic encephalopathy; likely cocaine induced Polysubstance abuse especially injectable cocaine - Currently he is very drowsy but easily arousable.  Once awake will need counseling and outpatient rehab referral -Provide supportive care for now  Per history he has ADHD and schizophrenia    DVT prophylaxis: Subcutaneous heparin Code Status: Full code Family Communication: None at bedside Disposition Plan: Likely discharge in next 24 hours once his mentation is better and cellulitis improving Consults called: Spoke  with orthopedic about CAT scan findings, recommends medical management Admission status: Observation admission   Please Note: This patient record was dictated using Animal nutritionist. Chart creation errors have been sought, but may not always have been located. Such creation errors do not reflect on the Standard of Medical Care.   Sutton Hirsch Joline Maxcy MD [facility] Pager [phone]  If 7PM-7AM, please contact night-coverage [email or web address] Password [email or web address]  01/06/2018, 12:12 PM

## 2018-01-06 NOTE — Progress Notes (Signed)
Pt has arrived to room 1502 from ED. Alert and oriented, talking on phone. Ordered his requests for food.

## 2018-01-06 NOTE — ED Notes (Signed)
Pt is IV drug user. Pt reports swelling and pain to rt arm for 3 days. Pt reports that he shoots cocaine. Pt unsure of when he last used the site.

## 2018-01-06 NOTE — ED Triage Notes (Signed)
Pt was picked up at the hotel by EMS  Pt complains of an infected right arm

## 2018-01-06 NOTE — ED Notes (Signed)
Pt unable to void. Pt drowsy and difficult to arouse. PA aware.

## 2018-01-06 NOTE — ED Notes (Signed)
Pt did not respond.

## 2018-01-07 DIAGNOSIS — F1990 Other psychoactive substance use, unspecified, uncomplicated: Secondary | ICD-10-CM

## 2018-01-07 DIAGNOSIS — L03113 Cellulitis of right upper limb: Secondary | ICD-10-CM | POA: Diagnosis not present

## 2018-01-07 DIAGNOSIS — F14150 Cocaine abuse with cocaine-induced psychotic disorder with delusions: Secondary | ICD-10-CM

## 2018-01-07 LAB — BASIC METABOLIC PANEL
ANION GAP: 6 (ref 5–15)
BUN: 10 mg/dL (ref 6–20)
CO2: 23 mmol/L (ref 22–32)
Calcium: 8.6 mg/dL — ABNORMAL LOW (ref 8.9–10.3)
Chloride: 110 mmol/L (ref 101–111)
Creatinine, Ser: 0.66 mg/dL (ref 0.61–1.24)
GLUCOSE: 101 mg/dL — AB (ref 65–99)
POTASSIUM: 4.1 mmol/L (ref 3.5–5.1)
Sodium: 139 mmol/L (ref 135–145)

## 2018-01-07 LAB — CBC
HCT: 38.5 % — ABNORMAL LOW (ref 39.0–52.0)
HEMOGLOBIN: 12.8 g/dL — AB (ref 13.0–17.0)
MCH: 30.8 pg (ref 26.0–34.0)
MCHC: 33.2 g/dL (ref 30.0–36.0)
MCV: 92.5 fL (ref 78.0–100.0)
PLATELETS: 244 10*3/uL (ref 150–400)
RBC: 4.16 MIL/uL — ABNORMAL LOW (ref 4.22–5.81)
RDW: 13 % (ref 11.5–15.5)
WBC: 7.6 10*3/uL (ref 4.0–10.5)

## 2018-01-07 MED ORDER — SULFAMETHOXAZOLE-TRIMETHOPRIM 800-160 MG PO TABS: 1.0000 | ORAL_TABLET | Freq: Two times a day (BID) | ORAL | 0 refills | Status: DC

## 2018-01-07 NOTE — Discharge Summary (Signed)
Physician Discharge Summary  Thomas [name] ZOX:[id number] DOB: 07/29/1996 DOA: 01/06/2018  PCP: [facility]  Admit date: 01/06/2018 Discharge date: 01/07/2018  Admitted From: home Disposition:  Home      LEFT AMA  Recommendations for Outpatient Follow-up:  He was given a prescription for oral bactrim  Home Health:no Equipment/Devices:none  Discharge Condition:stable CODE STATUS:full Diet recommendation: Heart Healthy  Brief/Interim Summary: 22 y.o. 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 Had a long conversation with him about the risk and benefits of leaving but she still chose to leave AGAINST MEDICAL ADVICE. On admission CT scan was done that showed no abscesses, he was started on IV Vanco and clindamycin his erythema improved as he decided to leave AGAINST MEDICAL ADVICE he was provided a prescription of Bactrim to continue to take for 2-4 weeks.  Discharge Instructions   Allergies as of 01/07/2018   No Known Allergies     Medication List    STOP taking these medications   amphetamine-dextroamphetamine 15 MG 24 hr capsule Commonly known as:  ADDERALL XR   paliperidone 234 MG/1.5ML Susp injection Commonly known as:  INVEGA SUSTENNA     TAKE these medications   sulfamethoxazole-trimethoprim 800-160 MG tablet Commonly known as:  BACTRIM DS,SEPTRA DS Take 1 tablet by mouth 2 (two) times daily.       No Known Allergies  Consultations:  None   Procedures/Studies: Dg  Chest 2 View  Result Date: 01/06/2018 CLINICAL DATA:  Redness and swelling EXAM: CHEST - 2 VIEW COMPARISON:  December 13, 2017 FINDINGS: The heart size and mediastinal contours are within normal limits. Both lungs are clear. The visualized skeletal structures are unremarkable. IMPRESSION: No active cardiopulmonary disease. Electronically Signed   By: Gerome Sam [name] M.D   On: 01/06/2018 10:22   Dg Chest 2 View  Result Date: 12/13/2017 CLINICAL DATA:  Hemoptysis EXAM: CHEST  2 VIEW COMPARISON:  03/17/2017 FINDINGS: Heart and mediastinal contours are within normal limits. No focal opacities or effusions. No acute bony abnormality. IMPRESSION: No active cardiopulmonary disease. Electronically Signed   By: Charlett Nose M.D.   On: 12/13/2017 01:31   Dg Elbow Complete Right  Result Date: 01/06/2018 CLINICAL DATA:  Soft tissue swelling and redness. EXAM: RIGHT ELBOW - COMPLETE 3+ VIEW COMPARISON:  None. FINDINGS: Soft tissue edema. No bony abnormality. No fracture or bony erosion. IMPRESSION: No bony abnormality. Electronically Signed   By: Gerome Sam [name] M.D   On: 01/06/2018 10:21   Dg Humerus Right  Result Date: 01/06/2018 CLINICAL DATA:  Red swollen upper extremity. Wound antecubital region. EXAM: RIGHT HUMERUS - 2+ VIEW COMPARISON:  None. FINDINGS: Soft tissue edema identified, consistent with history. No fracture or dislocation. No bony lesion or evidence of osteomyelitis. IMPRESSION: No bony abnormality identified. Electronically Signed   By: Gerome Sam [name] M.D   On: 01/06/2018 10:20   Dg Foot Complete Right  Result Date: 01/03/2018 CLINICAL DATA:  Right foot pain and swelling. History of IV drug use. EXAM: RIGHT FOOT COMPLETE - 3+ VIEW COMPARISON:  None. FINDINGS: There is no evidence of fracture or dislocation. There is no evidence of arthropathy or other focal bone abnormality. Soft tissue edema dorsally. No soft tissue air or radiopaque foreign body. IMPRESSION: Dorsal soft tissue  edema. No soft tissue air or radiopaque foreign body. No osseous abnormality. Electronically Signed   By: Rubye Oaks M.D.   On: 01/03/2018 01:25   Ct Extrem Up Entire Arm R W/cm  Result Date: 01/06/2018 CLINICAL DATA:  Right upper extremity pain and swelling for 3 days. Intravenous drug abuse. Soft tissue infection. EXAM: CT OF THE UPPER RIGHT EXTREMITY WITH CONTRAST TECHNIQUE: Multidetector CT imaging of the upper right extremity was performed according to the standard protocol following intravenous contrast administration. COMPARISON:  Radiographs same date. CONTRAST:  ISOVUE-300 IOPAMIDOL (ISOVUE-300) INJECTION 61% FINDINGS: Bones/Joint/Cartilage The entire right upper extremity was imaged, extending from the shoulder through the fingers. No osseous abnormalities are identified. Specifically, no evidence bone destruction, acute fracture or dislocation. No significant effusions are seen at the shoulder, elbow or wrist. Ligaments Suboptimally assessed by CT. Muscles and Tendons There are multiple air bubbles anteriorly within the soft tissues of the upper arm and proximal forearm. Some of these air bubbles are located deep to the biceps and brachialis musculature. There is mild intermuscular edema without drainable fluid collection. Soft tissues As above, soft tissue emphysema within the anterior upper arm and proximal forearm. There is diffuse soft tissue stranding in the subcutaneous fat consistent with cellulitis. No drainable abscess. No large vessel occlusion. Possible superficial thrombophlebitis involving the cephalic vein near the antecubital fossa. Prominent lymph nodes in the right axilla, likely reactive. IMPRESSION: 1. Findings are consistent with cellulitis and probable myofasciitis involving the anterior upper arm and proximal forearm. Associated scattered air bubbles, but no drainable fluid collection. 2. Possible superficial thrombophlebitis involving the cephalic vein. 3. No  evidence of osteomyelitis or septic joint. Electronically Signed   By: Carey Bullocks M.D.   On: 01/06/2018 11:43     Subjective: No complaints.  Discharge Exam: Vitals:   01/06/18 2056 01/07/18 0456  BP: 116/61 119/68  Pulse: 87 80  Resp: 19 18  Temp: 98.4 F (36.9 C) 97.8 F (36.6 C)  SpO2: 97% 97%   Vitals:   01/06/18 1303 01/06/18 1317 01/06/18 2056 01/07/18 0456  BP: (!) 96/57 114/69 116/61 119/68  Pulse: 83 93 87 80  Resp:  18 19 18   Temp:  98.3 F (36.8 C) 98.4 F (36.9 C) 97.8 F (36.6 C)  TempSrc:  Oral Oral Oral  SpO2: 100% 100% 97% 97%  Weight:    75 kg (165 lb 6.4 oz)    General: Pt is alert, awake, not in acute distress Cardiovascular: RRR, S1/S2 +, no rubs, no gallops Respiratory: CTA bilaterally, no wheezing, no rhonchi Abdominal: Soft, NT, ND, bowel sounds + Extremities: no edema, no cyanosis    The results of significant diagnostics from this hospitalization (including imaging, microbiology, ancillary and laboratory) are listed below for reference.     Microbiology: No results found for this or any previous visit (from the past 240 hour(s)).   Labs: BNP (last 3 results) No results for input(s): BNP in the last 8760 hours. Basic Metabolic Panel: Recent Labs  Lab 01/01/18 1235 01/03/18 0043 01/06/18 0920 01/06/18 1331 01/07/18 0534  NA 138 140 138  --  139  K 3.7 3.8 4.0  --  4.1  CL 103 103 105  --  110  CO2 23 29 25   --  23  GLUCOSE 136* 115* 109*  --  101*  BUN 11 11 12   --  10  CREATININE 0.87 0.90 0.91 0.90 0.66  CALCIUM 9.5 9.5 8.7*  --  8.6*   Liver Function Tests: Recent Labs  Lab 01/01/18 1235 01/03/18 0043  AST 19 21  ALT 20 20  ALKPHOS 60 56  BILITOT 0.7 0.4  PROT 7.7 6.8  ALBUMIN 4.3 3.6   No results for input(s): LIPASE, AMYLASE in the last 168 hours. No results for input(s): AMMONIA in the last 168 hours. CBC: Recent Labs  Lab 01/01/18 1235 01/03/18 0043 01/06/18 0920 01/06/18 1331 01/07/18 0534  WBC  9.5 9.1 11.1* 9.7 7.6  NEUTROABS 7.6 5.3 8.5*  --   --   HGB 15.5 12.6* 13.2 13.8 12.8*  HCT 45.2 36.0* 39.6 40.9 38.5*  MCV 92.4 90.5 92.3 93.4 92.5  PLT 185 199 240 216 244   Cardiac Enzymes: Recent Labs  Lab 01/06/18 0917  CKTOTAL 77   BNP: Invalid input(s): POCBNP CBG: Recent Labs  Lab 01/01/18 1141  GLUCAP 121*   D-Dimer No results for input(s): DDIMER in the last 72 hours. Hgb A1c No results for input(s): HGBA1C in the last 72 hours. Lipid Profile No results for input(s): CHOL, HDL, LDLCALC, TRIG, CHOLHDL, LDLDIRECT in the last 72 hours. Thyroid function studies No results for input(s): TSH, T4TOTAL, T3FREE, THYROIDAB in the last 72 hours.  Invalid input(s): FREET3 Anemia work up No results for input(s): VITAMINB12, FOLATE, FERRITIN, TIBC, IRON, RETICCTPCT in the last 72 hours. Urinalysis    Component Value Date/Time   COLORURINE YELLOW 12/31/2017 0423   APPEARANCEUR CLEAR 12/31/2017 0423   LABSPEC 1.023 12/31/2017 0423   PHURINE 5.0 12/31/2017 0423   GLUCOSEU >=500 (A) 12/31/2017 0423   HGBUR SMALL (A) 12/31/2017 0423   BILIRUBINUR NEGATIVE 12/31/2017 0423   KETONESUR 80 (A) 12/31/2017 0423   PROTEINUR 30 (A) 12/31/2017 0423   UROBILINOGEN 0.2 02/19/2015 1954   NITRITE NEGATIVE 12/31/2017 0423   LEUKOCYTESUR NEGATIVE 12/31/2017 0423   Sepsis Labs Invalid input(s): PROCALCITONIN,  WBC,  LACTICIDVEN Microbiology No results found for this or any previous visit (from the past 240 hour(s)).   Time coordinating discharge: Over 30 minutes  SIGNED:   Marinda ElkAbraham Feliz Ortiz, MD  [facility] 01/07/2018, 11:12 AM Pager   If 7PM-7AM, please contact night-coverage [email or web address] Password [email or web address]

## 2018-01-07 NOTE — Progress Notes (Signed)
Pt states he wants nurse to contact MD due to Toradol medication "burning" when being administered and does not like it. Pt also asking to shower but currently has no order to do so, MD texted paged both these requests awaiting MD order or reply to text

## 2018-01-07 NOTE — Progress Notes (Signed)
[facility] PROGRESS NOTE    Progress Note  Thomas [name]  ONG:[id number] DOB: 08/07/1996 DOA: 01/06/2018 PCP: [facility]     Brief Narrative:   Thomas [name] is an 22 y.o. male past medical history of IV drug abuse schizophrenia bipolar disorder was brought into the hospital for right arm pain and erythema, history of admission was limited on admission due to patient's mentation  Assessment/Plan:   Right arm cellulitis CT scan of the arm show mild fasciitis within anterior and proximal forearm without any drainable abscess it was discussed with the orthopedic doctor by Dr. Sharol Roussel who recommended medical management. Patient started on IV vancomycin and clindamycin, IV fluids and pain medication. Patient was requesting to leave I have explained to him the risk and benefits of doing this, and I have recommended him not to leave as this will get worse. Culture data is pending.  Add ibuprofen for pain.  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  Attention deficit hyperactivity disorder (ADHD) Cont home meds.   DVT prophylaxis: lovenox Family Communication:none Disposition Plan/Barrier to D/C: home in am Code Status:     Code Status Orders  (From admission, onward)        Start     Ordered   01/06/18 1211  Full code  Continuous     01/06/18 1211    Code Status History    Date Active Date Inactive Code Status Order ID Comments User Context   01/03/2018 0615 01/03/2018 1200 Full Code [id number]  Dione Booze, MD ED   01/03/2018 0047 01/03/2018 0615 Full Code [id number]  Roxy Horseman, PA-C ED   12/24/2017 1055 12/25/2017 0218 Full Code [id number]  Abelino Derrick, MD ED   12/18/2017 0337 12/18/2017 2026 Full Code [id number]  Geoffery Lyons, MD ED   12/10/2017 2216 12/11/2017 1538 Full Code [id number]  Charlestine Night, PA-C ED   12/07/2017 2151 12/08/2017 1346 Full Code [id number]  Rolan Bucco, MD ED   10/11/2017 1714 10/15/2017 1447 Full Code [id number]  Jimmye [name], MD ED   10/05/2017 0102 10/05/2017 0500 Full Code [id number]  Muthersbaugh, Boyd Kerbs ED   10/03/2017 0412 10/03/2017 1333 Full Code [id number]  Antony Madura, PA-C ED   09/24/2017 1418 09/25/2017 1303 Full Code [id number]  Linwood Dibbles, MD ED   08/10/2017 0223 08/10/2017 1621 Full Code [id number]  Ward, Layla Maw, DO ED   08/08/2017 0227 08/08/2017 1433 Full Code [id number]  Gilda Crease, MD ED   08/07/2017 0339 08/07/2017 1552 Full Code [id number]  Gilda Crease, MD ED   08/03/2017 2224 08/04/2017 1538 Full Code [id number]  Tegeler, Canary Brim, MD ED   08/03/2017 0235 08/03/2017 1606 Full Code [id number]  Elpidio Anis, PA-C ED   07/16/2017 2028 07/17/2017 1419 Full Code [id number]  Cristina Gong, PA-C ED   06/01/2017 0000 06/01/2017 1459 Full Code [id number]  Melene Plan, DO ED   05/29/2017 0852 05/30/2017 1521 Full Code [id number]  Linwood Dibbles, MD ED   05/09/2017 0012 05/09/2017 1407 Full Code [id number]  Audry Pili, PA-C ED   05/06/2017 2015 05/07/2017 1346 Full Code [id number]  Donnetta Hutching, MD ED   05/01/2017 0248 05/01/2017 1408 Full Code [id number]  Dione Booze, MD ED   04/23/2017 2133 04/24/2017 1639 Full Code [id number]  Vanetta Mulders, MD ED   02/06/2017 0204 02/06/2017 1505 Full Code [id number]  Muthersbaugh, Boyd Kerbs ED   01/22/2017 0046 01/22/2017 2147 Full Code [id number]  Arby BarrettePfeiffer, Marcy, MD ED   12/12/2016 0316 12/14/2016 1243 Full Code [id number]  Street, ReeltownMercedes, [place] JerseyPA-C ED   12/05/2016 1545 12/07/2016 2027 Full Code [id number]  Merlene LaughterSheikh, Omair Latif, [place] Inpatient   12/02/2016 2247 12/03/2016 1357 Full Code [id number]  Lavera GuiseLiu, Dana Duo, MD ED   11/28/2016 0248 11/28/2016 1059 Full Code [id number]  Gilda CreasePollina, Christopher J, MD ED   11/20/2016 1537 11/24/2016 1610 Full Code [id number]  Elgergawy, Leana Roeawood S, MD Inpatient   10/13/2016 1540 10/16/2016 1450 Full Code  [id number]  Tilden Fossaees, Elizabeth, MD ED   10/07/2016 2332 10/09/2016 1416 Full Code [id number]  Lavera GuiseLiu, Dana Duo, MD ED   09/11/2016 1943 09/13/2016 1441 Full Code [id number]  Lyndal PulleyKnott, Daniel, MD ED   09/08/2016 1345 09/08/2016 2018 Full Code [id number]  Garlon HatchetSanders, Lisa M, PA-C ED   09/01/2016 1504 09/02/2016 1542 Full Code [id number]  Gwyneth SproutPlunkett, Whitney, MD ED   08/25/2016 0052 08/25/2016 1501 Full Code [id number]  Derwood KaplanNanavati, Ankit, MD ED   07/18/2016 2321 07/21/2016 1431 Full Code [id number]  Lorre NickAllen, Anthony, MD ED   07/09/2016 1246 07/09/2016 1907 Full Code [id number]  Arby BarrettePfeiffer, Marcy, MD ED   06/30/2016 2053 07/05/2016 2058 Full Code [id number]  Clapacs, Jackquline DenmarkJohn T, MD Inpatient   06/03/2016 1517 06/06/2016 1741 Full Code [id number]  Marinda ElkFeliz Ortiz, Cartrell Bentsen, MD Inpatient   04/25/2016 0417 04/26/2016 1637 Full Code [id number]  Dione BoozeGlick, David, MD ED   03/19/2016 0456 03/20/2016 1153 Full Code [id number]  Benjiman CorePickering, [name], MD ED   02/25/2016 0250 03/06/2016 2257 Full Code [id number]  Roxy HorsemanBrowning, Robert, PA-C ED   02/19/2016 0834 02/23/2016 1955 Full Code [id number]  Dub MikesBerman, Rebecca R, RN ED   01/10/2016 1436 01/18/2016 1815 Full Code [id number]  Corena Pilgrimwolabi, [place], MD ED   12/14/2015 0701 12/15/2015 0010 Full Code [id number]  Gilda CreasePollina, Christopher J, MD ED   10/28/2015 0402 11/03/2015 1804 Full Code [id number]  Kerry HoughSimon, Spencer E, PA-C Inpatient   10/27/2015 1308 10/28/2015 0402 Full Code [id number]  Melene PlanFloyd, Dan, DO ED   07/07/2015 0358 07/08/2015 1254 Full Code [id number]  Devoria AlbeKnapp, Iva, MD ED   07/23/2014 1333 07/29/2014 1516 Full Code [id number]  Chauncey MannJennings, Glenn E, MD Inpatient   07/21/2014 2133 07/23/2014 1333 Full Code [id number]  Audree CamelGoldston, Scott T, MD ED   04/11/2014 2119 04/20/2014 1506 Full Code [id number]  Kristeen MansHobson, Fran E, NP Inpatient   03/28/2014 1034 04/01/2014 2208 Full Code [id number]  Kristeen MansHobson, Fran E, NP Inpatient   03/27/2014 1058 03/28/2014 1034 Full Code [id number]  Kendrick FriesBlankmann, Meghan, NP Inpatient   03/26/2014 2111 03/27/2014 1058 Full Code [id number]  Fayrene Helperran, Bowie, PA-C ED     12/24/2013 0042 12/30/2013 1636 Full Code [id number]  Kerry HoughSimon, Spencer E, PA-C Inpatient   12/23/2013 0048 12/24/2013 0042 Full Code [id number]  Lyanne Coampos, Kevin M, MD ED   12/27/2012 2249 12/28/2012 2207 Full Code [id number]  Loren RacerYelverton, David, MD ED   12/04/2012 0219 12/04/2012 1256 Full Code [id number]  Hurman HornBednar, John M, MD ED        IV Access:    Peripheral IV   Procedures and diagnostic studies:   Dg Chest 2 View  Result Date: 01/06/2018 CLINICAL DATA:  Redness and swelling EXAM: CHEST - 2 VIEW COMPARISON:  December 13, 2017 FINDINGS: The heart size and mediastinal contours are within normal limits. Both lungs are clear. The visualized skeletal structures are unremarkable. IMPRESSION: No active cardiopulmonary disease. Electronically Signed   By: Gerome Samavid  Williams [name] M.D   On: 01/06/2018 10:22   Dg Elbow Complete Right  Result Date: 01/06/2018 CLINICAL DATA:  Soft tissue swelling and redness. EXAM: RIGHT ELBOW - COMPLETE 3+ VIEW COMPARISON:  None. FINDINGS: Soft tissue edema. No bony abnormality. No fracture or bony erosion. IMPRESSION: No bony abnormality. Electronically Signed   By: Gerome Sam [name] M.D   On: 01/06/2018 10:21   Dg Humerus Right  Result Date: 01/06/2018 CLINICAL DATA:  Red swollen upper extremity. Wound antecubital region. EXAM: RIGHT HUMERUS - 2+ VIEW COMPARISON:  None. FINDINGS: Soft tissue edema identified, consistent with history. No fracture or dislocation. No bony lesion or evidence of osteomyelitis. IMPRESSION: No bony abnormality identified. Electronically Signed   By: Gerome Sam [name] M.D   On: 01/06/2018 10:20   Ct Extrem Up Entire Arm R W/cm  Result Date: 01/06/2018 CLINICAL DATA:  Right upper extremity pain and swelling for 3 days. Intravenous drug abuse. Soft tissue infection. EXAM: CT OF THE UPPER RIGHT EXTREMITY WITH CONTRAST TECHNIQUE: Multidetector CT imaging of the upper right extremity was performed according to the standard protocol following intravenous  contrast administration. COMPARISON:  Radiographs same date. CONTRAST:  ISOVUE-300 IOPAMIDOL (ISOVUE-300) INJECTION 61% FINDINGS: Bones/Joint/Cartilage The entire right upper extremity was imaged, extending from the shoulder through the fingers. No osseous abnormalities are identified. Specifically, no evidence bone destruction, acute fracture or dislocation. No significant effusions are seen at the shoulder, elbow or wrist. Ligaments Suboptimally assessed by CT. Muscles and Tendons There are multiple air bubbles anteriorly within the soft tissues of the upper arm and proximal forearm. Some of these air bubbles are located deep to the biceps and brachialis musculature. There is mild intermuscular edema without drainable fluid collection. Soft tissues As above, soft tissue emphysema within the anterior upper arm and proximal forearm. There is diffuse soft tissue stranding in the subcutaneous fat consistent with cellulitis. No drainable abscess. No large vessel occlusion. Possible superficial thrombophlebitis involving the cephalic vein near the antecubital fossa. Prominent lymph nodes in the right axilla, likely reactive. IMPRESSION: 1. Findings are consistent with cellulitis and probable myofasciitis involving the anterior upper arm and proximal forearm. Associated scattered air bubbles, but no drainable fluid collection. 2. Possible superficial thrombophlebitis involving the cephalic vein. 3. No evidence of osteomyelitis or septic joint. Electronically Signed   By: Carey Bullocks M.D.   On: 01/06/2018 11:43     Medical Consultants:    None.  Anti-Infectives:   IV Vanc and clindamycin  Subjective:    Thomas [name] he relates his erythema is better, he does relate he wants to leave today.  He relates he continues to be in pain.  Objective:    Vitals:   01/06/18 1303 01/06/18 1317 01/06/18 2056 01/07/18 0456  BP: (!) 96/57 114/69 116/61 119/68  Pulse: 83 93 87 80  Resp:  18 19  18   Temp:  98.3 F (36.8 C) 98.4 F (36.9 C) 97.8 F (36.6 C)  TempSrc:  Oral Oral Oral  SpO2: 100% 100% 97% 97%  Weight:    75 kg (165 lb 6.4 oz)    Intake/Output Summary (Last 24 hours) at 01/07/2018 1009 Last data filed at 01/07/2018 0929 Gross per 24 hour  Intake 2043 ml  Output 1900 ml  Net 143 ml   Filed Weights   01/07/18 0456  Weight: 75 kg (165 lb 6.4 oz)    Exam: General exam: In no acute distress. Respiratory system: Good air movement and clear to auscultation. Cardiovascular system: S1 & S2 heard, RRR.  Gastrointestinal system: Abdomen is nondistended, soft and nontender.  Central nervous system: Alert  and oriented. No focal neurological deficits. Extremities: His erythema has improved, still indurated not warm to touch but still tender to touch. Skin: No rashes, lesions or ulcers Psychiatry: Judgement and insight appear normal. Mood & affect appropriate.    Data Reviewed:    Labs: Basic Metabolic Panel: Recent Labs  Lab 01/01/18 1235 01/03/18 0043 01/06/18 0920 01/06/18 1331 01/07/18 0534  NA 138 140 138  --  139  K 3.7 3.8 4.0  --  4.1  CL 103 103 105  --  110  CO2 23 29 25   --  23  GLUCOSE 136* 115* 109*  --  101*  BUN 11 11 12   --  10  CREATININE 0.87 0.90 0.91 0.90 0.66  CALCIUM 9.5 9.5 8.7*  --  8.6*   GFR Estimated Creatinine Clearance: 134 mL/min (by C-G formula based on SCr of 0.66 mg/dL). Liver Function Tests: Recent Labs  Lab 01/01/18 1235 01/03/18 0043  AST 19 21  ALT 20 20  ALKPHOS 60 56  BILITOT 0.7 0.4  PROT 7.7 6.8  ALBUMIN 4.3 3.6   No results for input(s): LIPASE, AMYLASE in the last 168 hours. No results for input(s): AMMONIA in the last 168 hours. Coagulation profile No results for input(s): INR, PROTIME in the last 168 hours.  CBC: Recent Labs  Lab 01/01/18 1235 01/03/18 0043 01/06/18 0920 01/06/18 1331 01/07/18 0534  WBC 9.5 9.1 11.1* 9.7 7.6  NEUTROABS 7.6 5.3 8.5*  --   --   HGB 15.5 12.6* 13.2 13.8  12.8*  HCT 45.2 36.0* 39.6 40.9 38.5*  MCV 92.4 90.5 92.3 93.4 92.5  PLT 185 199 240 216 244   Cardiac Enzymes: Recent Labs  Lab 01/06/18 0917  CKTOTAL 77   BNP (last 3 results) No results for input(s): PROBNP in the last 8760 hours. CBG: Recent Labs  Lab 01/01/18 1141  GLUCAP 121*   D-Dimer: No results for input(s): DDIMER in the last 72 hours. Hgb A1c: No results for input(s): HGBA1C in the last 72 hours. Lipid Profile: No results for input(s): CHOL, HDL, LDLCALC, TRIG, CHOLHDL, LDLDIRECT in the last 72 hours. Thyroid function studies: No results for input(s): TSH, T4TOTAL, T3FREE, THYROIDAB in the last 72 hours.  Invalid input(s): FREET3 Anemia work up: No results for input(s): VITAMINB12, FOLATE, FERRITIN, TIBC, IRON, RETICCTPCT in the last 72 hours. Sepsis Labs: Recent Labs  Lab 01/01/18 1240 01/03/18 0043 01/06/18 0920 01/06/18 0930 01/06/18 1331 01/07/18 0534  WBC  --  9.1 11.1*  --  9.7 7.6  LATICACIDVEN 1.38  --   --  0.75  --   --    Microbiology No results found for this or any previous visit (from the past 240 hour(s)).   Medications:   . heparin  5,000 Units Subcutaneous Q8H   Continuous Infusions: . sodium chloride 100 mL/hr at 01/07/18 0854  . clindamycin (CLEOCIN) IV Stopped (01/07/18 0700)      LOS: 0 days   Marinda Elk  [facility] Pager [phone]  *Please refer to [email or web address], password [email or web address] to get updated schedule on who will round on this patient, as hospitalists switch teams weekly. If 7PM-7AM, please contact night-coverage at [email or web address], password [email or web address] for any overnight needs.  01/07/2018, 10:09 AM
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
## 2018-01-07 NOTE — Progress Notes (Signed)
Pt refused to stay, signed AMA papers, removed IV from left hand, paged MD to make aware and patient only waited to get prescription for Bactrim. Charge nurse aware

## 2018-01-08 ENCOUNTER — Emergency Department (HOSPITAL_COMMUNITY)
Admission: EM | Admit: 2018-01-08 | Discharge: 2018-01-08 | Discharge status: Against Medical Advice | Payer: Medicaid Other

## 2018-01-08 LAB — BLOOD CULTURE ID PANEL (REFLEXED)
ACINETOBACTER BAUMANNII: NOT DETECTED
CANDIDA TROPICALIS: NOT DETECTED
Candida albicans: NOT DETECTED
Candida glabrata: NOT DETECTED
Candida krusei: NOT DETECTED
Candida parapsilosis: NOT DETECTED
ENTEROBACTERIACEAE SPECIES: NOT DETECTED
ESCHERICHIA COLI: NOT DETECTED
Enterobacter cloacae complex: NOT DETECTED
Enterococcus species: NOT DETECTED
HAEMOPHILUS INFLUENZAE: NOT DETECTED
KLEBSIELLA PNEUMONIAE: NOT DETECTED
Klebsiella oxytoca: NOT DETECTED
LISTERIA MONOCYTOGENES: NOT DETECTED
Methicillin resistance: NOT DETECTED
NEISSERIA MENINGITIDIS: NOT DETECTED
PROTEUS SPECIES: NOT DETECTED
Pseudomonas aeruginosa: NOT DETECTED
SERRATIA MARCESCENS: NOT DETECTED
STAPHYLOCOCCUS AUREUS BCID: NOT DETECTED
STAPHYLOCOCCUS SPECIES: DETECTED — AB
STREPTOCOCCUS AGALACTIAE: NOT DETECTED
STREPTOCOCCUS SPECIES: NOT DETECTED
Streptococcus pneumoniae: NOT DETECTED
Streptococcus pyogenes: NOT DETECTED

## 2018-01-08 LAB — CULTURE, BLOOD (ROUTINE X 2): Special Requests: ADEQUATE

## 2018-01-08 NOTE — ED Notes (Signed)
No answer for triage x2. Pt was seen walking outside.

## 2018-01-08 NOTE — ED Notes (Signed)
Unable to locate pt In lobby

## 2018-01-11 LAB — CULTURE, BLOOD (ROUTINE X 2)
Culture: NO GROWTH
Special Requests: ADEQUATE

## 2018-01-17 DIAGNOSIS — F250 Schizoaffective disorder, bipolar type: Secondary | ICD-10-CM | POA: Insufficient documentation

## 2018-01-17 DIAGNOSIS — F909 Attention-deficit hyperactivity disorder, unspecified type: Secondary | ICD-10-CM | POA: Insufficient documentation

## 2018-01-17 DIAGNOSIS — F17210 Nicotine dependence, cigarettes, uncomplicated: Secondary | ICD-10-CM | POA: Diagnosis not present

## 2018-01-17 DIAGNOSIS — R45851 Suicidal ideations: Secondary | ICD-10-CM | POA: Diagnosis not present

## 2018-01-17 DIAGNOSIS — F14180 Cocaine abuse with cocaine-induced anxiety disorder: Secondary | ICD-10-CM | POA: Diagnosis not present

## 2018-01-17 DIAGNOSIS — F1910 Other psychoactive substance abuse, uncomplicated: Secondary | ICD-10-CM | POA: Insufficient documentation

## 2018-01-17 DIAGNOSIS — R4182 Altered mental status, unspecified: Secondary | ICD-10-CM | POA: Diagnosis present

## 2018-01-17 NOTE — ED Triage Notes (Signed)
Pt here via EMS with c/o altered mental status. Per EMS pt used IV opioids about an hour ago and took cough syrup as well. Pt is alert and oriented x 4 and has stable vital signs.

## 2018-01-18 ENCOUNTER — Other Ambulatory Visit: Payer: Self-pay

## 2018-01-18 ENCOUNTER — Emergency Department (HOSPITAL_COMMUNITY)
Admission: EM | Admit: 2018-01-18 | Discharge: 2018-01-18 | Disposition: A | Discharge status: Home / Self Care | Payer: Medicaid Other | Attending: Emergency Medicine | Admitting: Emergency Medicine

## 2018-01-18 ENCOUNTER — Encounter (HOSPITAL_COMMUNITY): Payer: Self-pay | Admitting: Emergency Medicine

## 2018-01-18 DIAGNOSIS — R45851 Suicidal ideations: Secondary | ICD-10-CM

## 2018-01-18 DIAGNOSIS — F1910 Other psychoactive substance abuse, uncomplicated: Secondary | ICD-10-CM

## 2018-01-18 DIAGNOSIS — F14180 Cocaine abuse with cocaine-induced anxiety disorder: Secondary | ICD-10-CM

## 2018-01-18 LAB — COMPREHENSIVE METABOLIC PANEL
ALBUMIN: 3.9 g/dL (ref 3.5–5.0)
ALK PHOS: 60 U/L (ref 38–126)
ALT: 17 U/L (ref 17–63)
ANION GAP: 7 (ref 5–15)
AST: 16 U/L (ref 15–41)
BUN: 13 mg/dL (ref 6–20)
CALCIUM: 9.4 mg/dL (ref 8.9–10.3)
CO2: 27 mmol/L (ref 22–32)
Chloride: 106 mmol/L (ref 101–111)
Creatinine, Ser: 0.78 mg/dL (ref 0.61–1.24)
GFR calc Af Amer: >60 mL/min (ref >60)
GFR calc non Af Amer: >60 mL/min (ref >60)
GLUCOSE: 88 mg/dL (ref 65–99)
POTASSIUM: 4.1 mmol/L (ref 3.5–5.1)
SODIUM: 140 mmol/L (ref 135–145)
Total Bilirubin: 0.6 mg/dL (ref 0.3–1.2)
Total Protein: 7 g/dL (ref 6.5–8.1)

## 2018-01-18 LAB — CBC WITH DIFFERENTIAL/PLATELET
Basophils Absolute: 0 10*3/uL (ref 0.0–0.1)
Basophils Relative: 0 %
EOS ABS: 0.2 10*3/uL (ref 0.0–0.7)
Eosinophils Relative: 2 %
HCT: 42 % (ref 39.0–52.0)
HEMOGLOBIN: 14.4 g/dL (ref 13.0–17.0)
Lymphocytes Relative: 38 %
Lymphs Abs: 2.9 10*3/uL (ref 0.7–4.0)
MCH: 31.3 pg (ref 26.0–34.0)
MCHC: 34.3 g/dL (ref 30.0–36.0)
MCV: 91.3 fL (ref 78.0–100.0)
MONOS PCT: 9 %
Monocytes Absolute: 0.7 10*3/uL (ref 0.1–1.0)
NEUTROS PCT: 51 %
Neutro Abs: 3.9 10*3/uL (ref 1.7–7.7)
Platelets: 248 10*3/uL (ref 150–400)
RBC: 4.6 MIL/uL (ref 4.22–5.81)
RDW: 13.3 % (ref 11.5–15.5)
WBC: 7.7 10*3/uL (ref 4.0–10.5)

## 2018-01-18 LAB — ETHANOL: Alcohol, Ethyl (B): <10 mg/dL (ref <10)

## 2018-01-18 LAB — RAPID URINE DRUG SCREEN, HOSP PERFORMED
Amphetamines: NOT DETECTED
BARBITURATES: NOT DETECTED
BENZODIAZEPINES: NOT DETECTED
COCAINE: POSITIVE — AB
Opiates: NOT DETECTED
TETRAHYDROCANNABINOL: NOT DETECTED

## 2018-01-18 NOTE — ED Notes (Signed)
Pt changed and wanded by Security.  

## 2018-01-18 NOTE — ED Notes (Signed)
Psych NP at bedside.  She reports the Pt has a court date today.
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
## 2018-01-18 NOTE — ED Notes (Signed)
Pt re-evaluated by Psych NP and is still being discharged.

## 2018-01-18 NOTE — ED Notes (Signed)
[place] to [place].

## 2018-01-18 NOTE — Progress Notes (Signed)
Pt is recommended for continued observation for safety and stabilization and to be reassessed in the AM by psych. EDP Ward, Chase PicketJaime Pilcher, PA-C has been advised of the disposition.   Princess BruinsAquicha Sanjay Broadfoot, MSW, LCSW Therapeutic Triage Specialist  [phone]

## 2018-01-18 NOTE — Discharge Instructions (Signed)
Follow up with your primary care doctor. Return to ER for new or worsening symptoms, any additional concerns.  °

## 2018-01-18 NOTE — ED Notes (Signed)
After being told he is up for d/c, Pt asks if he can stay longer d/t "not having a place to go."  When told no, Pt reports he is "unsafe to leave."  Sts "I can't go.  I'm suicidal."  Psych NP informed and will come speak to the Pt.

## 2018-01-18 NOTE — ED Notes (Signed)
Pt is now reporting to EDP that he wants rehab and is suicidal.

## 2018-01-18 NOTE — ED Notes (Signed)
TTS took Pt to Conference room for assessment.

## 2018-01-18 NOTE — ED Notes (Signed)
Pt given ham sandwich, graham crackers, peanut butter, and caffeine free diet coke.
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
## 2018-01-18 NOTE — BH Assessment (Addendum)
Assessment Note  Thomas [name] is an 22 y.o. male who presents to the ED voluntarily. Pt is well known to this ED and has 24 visits to the ED in the past 6 months. Pt is homeless and states he is living in a motel. Pt states he has been suicidal and attempted to kill himself by overdosing on heroin. Pt states he does not have any supports and feels hopeless. Pt states he has pending legal charges due to larceny, but he does not know when he has to go to court. Pt was asked if he has been experiencing AVH and initially pt denied, pt later retracted his statement and said "yeah I do, the voices tell me to hurt myself. Pt states he receives disability for which he is his own payee. Pt states he gets his medication via "shots" with [facility] and states he last received a shot 2 weeks ago. Pt states to this writer that he wants to go to a state hospital. Pt asked multiple times if this writer will be able to admit him to a state hospital. Pt laying with his head on the table and wrapped in a blanket. TTS observed the pt ask his nurse for a sandwich after the assessment.   Pt is recommended for continued observation for safety and stabilization and to be reassessed in the AM by psych. EDP Ward, Chase Picket, PA-C has been advised of the disposition. Pt's nurse Althea Charon, RN notified of the disposition.   Diagnosis: MDD, recurrent, severe, w/ psychosis; Opioid use disorder, severe   Past Medical History:  Past Medical History:  Diagnosis Date  . ADHD (attention deficit hyperactivity disorder)   . Anxiety   . Bipolar 1 disorder (HCC)   . Current smoker   . Depression   . Eating disorder   . Headache(784.0)   . History of ADHD 11/01/2015  . Medical history non-contributory   . Mental disorder   . Nicotine dependence 11/03/2015  . 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 Schizophrenia (HCC)     History reviewed. No pertinent surgical history.  Family History: No  family history on file.  Social History:  reports that he has been smoking cigarettes.  He has been smoking about 0.00 packs per day for the past 4.00 years. He has never used smokeless tobacco. He reports that he drinks alcohol. He reports that he has current or past drug history. Drugs: Marijuana, Cocaine, LSD, MDMA (Ecstacy), and Methamphetamines.  Additional Social History:  Alcohol / Drug Use Pain Medications: See MAR Prescriptions: See MAR Over the Counter: See MAR History of alcohol / drug use?: Yes Substance #1 Name of Substance 1: Heroin (Opiates) 1 - Age of First Use: teenager 1 - Amount (size/oz): $20-$30 worth  1 - Frequency: binges 1 - Duration: ongoing 1 - Last Use / Amount: 01/17/18 Substance #2 Name of Substance 2: Cocaine 2 - Age of First Use: unknown 2 - Amount (size/oz): 1 bag  2 - Frequency: occasional 2 - Duration: ongoing 2 - Last Use / Amount: 2 weeks ago   CIWA: CIWA-Ar BP: 117/79 Pulse Rate: 62 COWS:    Allergies: No Known Allergies  Home Medications:  (Not in a hospital admission)  OB/GYN Status:  No LMP for male patient.  General Assessment Data Location of Assessment: [facility] TTS Assessment: In system Is this a Tele or Face-to-Face Assessment?: Face-to-Face Is this an Initial Assessment or a Re-assessment for this encounter?: Initial Assessment Marital  status: Single Is patient pregnant?: No Pregnancy Status: No Living Arrangements: Other (Comment)(motel) Can pt return to current living arrangement?: Yes Admission Status: Voluntary Is patient capable of signing voluntary admission?: Yes Referral Source: Self/Family/Friend Insurance type: Medicaid     Crisis Care Plan Living Arrangements: Other (Comment)(motel) Name of Psychiatrist: [facility] Name of Therapist: [facility]  Education Status Is patient currently in school?: No Is the patient employed, unemployed or receiving disability?: Receiving disability income  Risk to self with the  past 6 months Suicidal Ideation: Yes-Currently Present Has patient been a risk to self within the past 6 months prior to admission? : Yes Suicidal Intent: Yes-Currently Present Has patient had any suicidal intent within the past 6 months prior to admission? : Yes Is patient at risk for suicide?: Yes Suicidal Plan?: Yes-Currently Present Has patient had any suicidal plan within the past 6 months prior to admission? : Yes Specify Current Suicidal Plan: pt states he attempted to OD on heroin in order to kill himself  Access to Means: Yes Specify Access to Suicidal Means: pt has access to heroin  What has been your use of drugs/alcohol within the last 12 months?: reports to heroin and cocaine use  Previous Attempts/Gestures: Yes How many times?: 2 Triggers for Past Attempts: Unpredictable Intentional Self Injurious Behavior: None Family Suicide History: Data processing managerYes(grandfather ) Recent stressful life event(s): Financial Problems, Other (Comment), Turmoil (Comment)(homeless, increased SA) Persecutory voices/beliefs?: Yes Depression: Yes Depression Symptoms: Feeling worthless/self pity, Loss of interest in usual pleasures, Fatigue Substance abuse history and/or treatment for substance abuse?: Yes Suicide prevention information given to non-admitted patients: Not applicable  Risk to Others within the past 6 months Homicidal Ideation: No Does patient have any lifetime risk of violence toward others beyond the six months prior to admission? : No Thoughts of Harm to Others: No Current Homicidal Intent: No Current Homicidal Plan: No Access to Homicidal Means: No History of harm to others?: No Assessment of Violence: None Noted Does patient have access to weapons?: No Criminal Charges Pending?: Yes Describe Pending Criminal Charges: larceny Does patient have a court date: Yes Court Date: (pt states he cannot recall ) Is patient on probation?: No  Psychosis Hallucinations: Auditory, With  command Delusions: None noted  Mental Status Report Appearance/Hygiene: Disheveled Eye Contact: Poor Motor Activity: Freedom of movement Speech: Logical/coherent Level of Consciousness: Drowsy Mood: Depressed Affect: Flat Anxiety Level: None Thought Processes: Relevant, Coherent Judgement: Impaired Orientation: Person, Place, Time, Situation, Appropriate for developmental age Obsessive Compulsive Thoughts/Behaviors: None  Cognitive Functioning Concentration: Normal Memory: Remote Intact, Recent Intact Is patient IDD: No Is patient DD?: No Insight: Poor Impulse Control: Poor Appetite: Good Have you had any weight changes? : No Change Sleep: No Change Total Hours of Sleep: 9 Vegetative Symptoms: None  ADLScreening [facility] Assessment Services) Patient's cognitive ability adequate to safely complete daily activities?: Yes Patient able to express need for assistance with ADLs?: Yes Independently performs ADLs?: Yes (appropriate for developmental age)  Prior Inpatient Therapy Prior Inpatient Therapy: Yes Prior Therapy Dates: 2019 [facility] Reason for Treatment: MH issues  Prior Outpatient Therapy Prior Outpatient Therapy: Yes Prior Therapy Dates: Current Prior Therapy Facilty/Provider(s): [facility] Reason for Treatment: Medication management and counseling.  Does patient have an ACCT team?: Yes Does patient have Intensive In-House Services?  : No Does patient have [facility] services? : No Does patient have P4CC services?: No  ADL Screening (condition at time of admission) Patient's cognitive ability adequate to safely complete daily  activities?: Yes Is the patient deaf or have difficulty hearing?: No Does the patient have difficulty seeing, even when wearing glasses/contacts?: No Does the patient have difficulty concentrating, remembering, or making decisions?: No Patient able to express need for assistance with ADLs?: Yes Does the patient have  difficulty dressing or bathing?: No Independently performs ADLs?: Yes (appropriate for developmental age) Does the patient have difficulty walking or climbing stairs?: No Weakness of Legs: None Weakness of Arms/Hands: None  Home Assistive Devices/Equipment Home Assistive Devices/Equipment: None    Abuse/Neglect Assessment (Assessment to be complete while patient is alone) Abuse/Neglect Assessment Can Be Completed: Yes Physical Abuse: Denies Verbal Abuse: Denies Sexual Abuse: Denies Exploitation of patient/patient's resources: Denies Self-Neglect: Denies     Merchant navy officer (For Healthcare) Does Patient Have a Medical Advance Directive?: No Would patient like information on creating a medical advance directive?: No - Patient declined Nutrition Screen- [facility] Patient's home diet: (pt. given sandwich.)  Additional Information 1:1 In Past 12 Months?: No CIRT Risk: No Elopement Risk: No Does patient have medical clearance?: Yes     Disposition: Pt is recommended for continued observation for safety and stabilization and to be reassessed in the AM by psych. EDP Ward, Chase Picket, PA-C has been advised of the disposition. Pt's nurse Althea Charon, RN notified of the disposition.   Disposition Initial Assessment Completed for this Encounter: Yes Disposition of Patient: (overnight obs and re-assess in AM by psych ) Patient refused recommended treatment: No  On Site Evaluation by:   Reviewed with Physician:    Karolee Ohs 01/18/2018 6:01 AM

## 2018-01-18 NOTE — ED Notes (Signed)
Pt. In burgundy scrubs. Pt. Has 1 belongings bag. Pt. Belongings locked up at the nurses station in cabinet RES A andB.

## 2018-01-27 ENCOUNTER — Emergency Department (HOSPITAL_COMMUNITY)
Admission: EM | Admit: 2018-01-27 | Discharge: 2018-01-27 | Disposition: A | Discharge status: Home / Self Care | Payer: Medicaid Other | Attending: Emergency Medicine | Admitting: Emergency Medicine

## 2018-01-27 DIAGNOSIS — R45851 Suicidal ideations: Secondary | ICD-10-CM | POA: Insufficient documentation

## 2018-01-27 DIAGNOSIS — F17210 Nicotine dependence, cigarettes, uncomplicated: Secondary | ICD-10-CM | POA: Insufficient documentation

## 2018-01-27 DIAGNOSIS — F1910 Other psychoactive substance abuse, uncomplicated: Secondary | ICD-10-CM | POA: Diagnosis not present

## 2018-01-27 DIAGNOSIS — R4182 Altered mental status, unspecified: Secondary | ICD-10-CM | POA: Diagnosis present

## 2018-01-27 LAB — CBC WITH DIFFERENTIAL/PLATELET
BASOS PCT: 0 %
Basophils Absolute: 0 10*3/uL (ref 0.0–0.1)
EOS ABS: 0.1 10*3/uL (ref 0.0–0.7)
Eosinophils Relative: 1 %
HEMATOCRIT: 40.2 % (ref 39.0–52.0)
Hemoglobin: 13.9 g/dL (ref 13.0–17.0)
LYMPHS ABS: 1.4 10*3/uL (ref 0.7–4.0)
Lymphocytes Relative: 14 %
MCH: 31 pg (ref 26.0–34.0)
MCHC: 34.6 g/dL (ref 30.0–36.0)
MCV: 89.7 fL (ref 78.0–100.0)
Monocytes Absolute: 0.8 10*3/uL (ref 0.1–1.0)
Monocytes Relative: 9 %
NEUTROS ABS: 7.3 10*3/uL (ref 1.7–7.7)
NEUTROS PCT: 76 %
Platelets: 202 10*3/uL (ref 150–400)
RBC: 4.48 MIL/uL (ref 4.22–5.81)
RDW: 13.1 % (ref 11.5–15.5)
WBC: 9.6 10*3/uL (ref 4.0–10.5)

## 2018-01-27 LAB — ETHANOL

## 2018-01-27 LAB — COMPREHENSIVE METABOLIC PANEL
ALT: 24 U/L (ref 17–63)
ANION GAP: 10 (ref 5–15)
AST: 18 U/L (ref 15–41)
Albumin: 3.9 g/dL (ref 3.5–5.0)
Alkaline Phosphatase: 58 U/L (ref 38–126)
BUN: 15 mg/dL (ref 6–20)
CHLORIDE: 102 mmol/L (ref 101–111)
CO2: 26 mmol/L (ref 22–32)
CREATININE: 0.84 mg/dL (ref 0.61–1.24)
Calcium: 9.3 mg/dL (ref 8.9–10.3)
Glucose, Bld: 134 mg/dL — ABNORMAL HIGH (ref 65–99)
Potassium: 3.8 mmol/L (ref 3.5–5.1)
SODIUM: 138 mmol/L (ref 135–145)
Total Bilirubin: 0.8 mg/dL (ref 0.3–1.2)
Total Protein: 7.3 g/dL (ref 6.5–8.1)

## 2018-01-27 LAB — RAPID URINE DRUG SCREEN, HOSP PERFORMED
Amphetamines: POSITIVE — AB
BENZODIAZEPINES: NOT DETECTED
Barbiturates: NOT DETECTED
Cocaine: POSITIVE — AB
OPIATES: NOT DETECTED
TETRAHYDROCANNABINOL: POSITIVE — AB

## 2018-01-27 MED ORDER — SODIUM CHLORIDE 0.9 % IV SOLN
INTRAVENOUS | Status: DC
  Administered 2018-01-27: 14:00:00 via INTRAVENOUS

## 2018-01-27 NOTE — ED Notes (Signed)
Yellow boxcutter given to security with patient label attached.

## 2018-01-27 NOTE — Discharge Instructions (Addendum)
[facility]  Return for any new or worse symptoms.

## 2018-01-27 NOTE — ED Notes (Signed)
Bed: [id number] Expected date: 01/27/18 Expected time: 1:15 PM Means of arrival: Ambulance Comments: 22 yo  Ingested Molly and Meth, hallucinating/Tachy
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
## 2018-01-27 NOTE — ED Notes (Signed)
Patient directed to security office to retrieve weapon.
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
## 2018-01-29 ENCOUNTER — Encounter (HOSPITAL_COMMUNITY): Payer: Self-pay | Admitting: Emergency Medicine

## 2018-01-29 ENCOUNTER — Emergency Department (HOSPITAL_COMMUNITY): Payer: Medicaid Other

## 2018-01-29 ENCOUNTER — Emergency Department (HOSPITAL_COMMUNITY)
Admission: EM | Admit: 2018-01-29 | Discharge: 2018-01-30 | Disposition: A | Discharge status: Home / Self Care | Payer: Medicaid Other | Attending: Emergency Medicine | Admitting: Emergency Medicine

## 2018-01-29 DIAGNOSIS — F209 Schizophrenia, unspecified: Secondary | ICD-10-CM | POA: Diagnosis not present

## 2018-01-29 DIAGNOSIS — J340 Abscess, furuncle and carbuncle of nose: Secondary | ICD-10-CM | POA: Insufficient documentation

## 2018-01-29 DIAGNOSIS — F1520 Other stimulant dependence, uncomplicated: Secondary | ICD-10-CM | POA: Insufficient documentation

## 2018-01-29 DIAGNOSIS — Z046 Encounter for general psychiatric examination, requested by authority: Secondary | ICD-10-CM | POA: Diagnosis present

## 2018-01-29 DIAGNOSIS — F909 Attention-deficit hyperactivity disorder, unspecified type: Secondary | ICD-10-CM | POA: Diagnosis not present

## 2018-01-29 DIAGNOSIS — Z590 Homelessness: Secondary | ICD-10-CM | POA: Diagnosis not present

## 2018-01-29 DIAGNOSIS — R45851 Suicidal ideations: Secondary | ICD-10-CM | POA: Diagnosis not present

## 2018-01-29 DIAGNOSIS — F17210 Nicotine dependence, cigarettes, uncomplicated: Secondary | ICD-10-CM | POA: Insufficient documentation

## 2018-01-29 DIAGNOSIS — F332 Major depressive disorder, recurrent severe without psychotic features: Secondary | ICD-10-CM | POA: Diagnosis not present

## 2018-01-29 LAB — CBC
HEMATOCRIT: 40.9 % (ref 39.0–52.0)
Hemoglobin: 14.4 g/dL (ref 13.0–17.0)
MCH: 31.3 pg (ref 26.0–34.0)
MCHC: 35.2 g/dL (ref 30.0–36.0)
MCV: 88.9 fL (ref 78.0–100.0)
Platelets: 205 10*3/uL (ref 150–400)
RBC: 4.6 MIL/uL (ref 4.22–5.81)
RDW: 13.1 % (ref 11.5–15.5)
WBC: 8.8 10*3/uL (ref 4.0–10.5)

## 2018-01-29 LAB — COMPREHENSIVE METABOLIC PANEL
ALT: 23 U/L (ref 17–63)
AST: 20 U/L (ref 15–41)
Albumin: 4 g/dL (ref 3.5–5.0)
Alkaline Phosphatase: 61 U/L (ref 38–126)
Anion gap: 11 (ref 5–15)
BUN: 11 mg/dL (ref 6–20)
CALCIUM: 9.4 mg/dL (ref 8.9–10.3)
CHLORIDE: 100 mmol/L — AB (ref 101–111)
CO2: 24 mmol/L (ref 22–32)
CREATININE: 0.94 mg/dL (ref 0.61–1.24)
Glucose, Bld: 87 mg/dL (ref 65–99)
Potassium: 3.9 mmol/L (ref 3.5–5.1)
Sodium: 135 mmol/L (ref 135–145)
Total Bilirubin: 0.9 mg/dL (ref 0.3–1.2)
Total Protein: 6.9 g/dL (ref 6.5–8.1)

## 2018-01-29 LAB — ETHANOL

## 2018-01-29 LAB — ACETAMINOPHEN LEVEL

## 2018-01-29 LAB — SALICYLATE LEVEL

## 2018-01-29 MED ORDER — IOPAMIDOL (ISOVUE-300) INJECTION 61%
INTRAVENOUS | Status: AC
  Filled 2018-01-29: qty 100

## 2018-01-29 MED ORDER — IOPAMIDOL (ISOVUE-300) INJECTION 61%
75.0000 mL | Freq: Once | INTRAVENOUS | Status: AC | PRN
  Administered 2018-01-29: 75 mL via INTRAVENOUS

## 2018-01-29 NOTE — ED Provider Notes (Signed)
Patient placed in Quick Look pathway, seen and evaluated   Chief Complaint: SI, Facial pain and swelling  HPI:   22 y.o. M who presents for evaluation of suicidal ideations.  Patient also reports swelling, pain to the left nostril, left face.  Patient reports that he snorts meth.  He reports he last started 3 days ago.  Patient states that he feels pain and pressure to the area and states that he noticed some purulent drainage earlier today.  He states that he has not had any fevers.  ROS:  Facial pain   Physical Exam:   Gen: No distress  Neuro: Awake and Alert  Skin: Warm  Neuro: Cranial nerves [name]-XII intact   Follows commands, Moves all extremities    5/5 strength to BUE and BLE     Focused Exam: Swelling, warmth, erythema noted along the left nare that extends up the nasal bridge.  Evaluation of left nare shows an area of edema and fluctuance that is actively draining purulent drainage.  Unclear how far back this extends.  Cranial nerves [name]-XII intact Follows commands, Moves all extremities  5/5 strength to BUE and BLE    Initiation of care has begun. The patient has been counseled on the process, plan, and necessity for staying for the completion/evaluation, and the remainder of the medical screening examination    Rosana HoesLayden, Catlyn Shipton A, PA-C 01/29/18 1840    Wynetta FinesMessick, Peter C, MD 01/30/18 [phone]

## 2018-01-29 NOTE — ED Triage Notes (Addendum)
Pt complains of left nostril hurts from snorting meth 3 days ago. His left nostril is swollen and painful. Pt also complains of headache. Pt states wants to go to rehab. He feels SI; pt is homeless and wants to get his life back together.

## 2018-01-29 NOTE — ED Notes (Signed)
Pt wanded by security. Belongings placed in locker 4.

## 2018-01-29 NOTE — ED Notes (Signed)
Pt changed into maroon scrubs.  

## 2018-01-30 ENCOUNTER — Encounter (HOSPITAL_COMMUNITY): Payer: Self-pay | Admitting: Registered Nurse

## 2018-01-30 LAB — RAPID URINE DRUG SCREEN, HOSP PERFORMED
AMPHETAMINES: POSITIVE — AB
BENZODIAZEPINES: NOT DETECTED
Barbiturates: NOT DETECTED
Cocaine: POSITIVE — AB
Opiates: NOT DETECTED
Tetrahydrocannabinol: NOT DETECTED

## 2018-01-30 MED ORDER — ACETAMINOPHEN 325 MG PO TABS: 650.0000 mg | ORAL_TABLET | Freq: Four times a day (QID) | ORAL | Status: DC | PRN

## 2018-01-30 MED ORDER — IBUPROFEN 400 MG PO TABS: 600.0000 mg | ORAL_TABLET | Freq: Three times a day (TID) | ORAL | Status: DC | PRN

## 2018-01-30 MED ORDER — SULFAMETHOXAZOLE-TRIMETHOPRIM 800-160 MG PO TABS
1.0000 | ORAL_TABLET | Freq: Two times a day (BID) | ORAL | Status: DC
  Administered 2018-01-30 (×2): 1 via ORAL
  Filled 2018-01-30 (×2): qty 1

## 2018-01-30 MED ORDER — SULFAMETHOXAZOLE-TRIMETHOPRIM 800-160 MG PO TABS: 1.0000 | ORAL_TABLET | Freq: Two times a day (BID) | ORAL | 0 refills | Status: DC

## 2018-01-30 NOTE — ED Notes (Signed)
Breakfast tray ordered 

## 2018-01-30 NOTE — Discharge Instructions (Addendum)
WILL NEED TO COMPLETE COURSE OF BACTRIM FOR 7 DAYS. IF DISCHARGED EARLIER THAN THIS, PLEASE INCLUDE PRESCRIPTION FOR REMAINDER OF COURSE.
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
## 2018-01-30 NOTE — ED Notes (Signed)
Patient back from TTS and is eating lunch at this time. Patient stated he was "ready to go home".

## 2018-01-30 NOTE — ED Notes (Signed)
Transported to podf for TTS

## 2018-01-30 NOTE — BH Assessment (Addendum)
Tele Assessment Note   Patient Name: Thomas [name] MRN: [id number] Referring Physician: Pearlie Oyster, PA Location of Patient: [facility] Location of Provider: [facility] Thomas [name] is an 22 y.o. male.  -Clinician reviewed note by Pearlie Oyster, PA.  Thomas [name] is a 22 y.o. male  with a PMH of homelessness, bipolar disorder, schizophrenia, substance abuse who presents to the Emergency Department complaining of suicidal thoughts. He denies any current plan, but does state that he is trying to think of a cheap way to kill himself.  Patient does say that he has SI but no particular plan.  Patient is irritated with his ACTT team provider[facility].  He says they only give him money if he is going to stay in a hotel.  He talks positively about his tent [place] the woods and how he has a friend that also has a tent close-by.    Patient is also upset with his payee which is an organization called [employer].  He complains that neither them or [facility] ACTT team are easy to get in touch with.  He says he does not have a phone anymore and he wants to get one.  Pt does not have any HI or visual hallucinations.  He hears voices telling him to use drugs.  Patient has been using methamphetamine, snorting it.  He says his last use was 01/26/18.  Patient is not clear about how much and how often it was that he was using it.  Patient has outpatient ACTT team services from [facility] ACTT team.  He has had admissions to different hospitals over the lst few years, including [facility], [facility] and other facilities.  Pt has been in the ED 26 times in the last 6 months.  He was at another local hospital on 04/15 for psychiatric complaints.  -Clinician discussed patient care with Patriciaann Clan, PA.  He recommends an AM evaluation by telepsychiatry to determine disposition.  Clinician informed Dr. Dina Rich of patient needing to stay for an telepsych in the  AM.  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 ADHD (attention deficit hyperactivity disorder)   . Anxiety   . Bipolar 1 disorder ([place])   . Current smoker   . Depression   . Eating disorder   . Headache(784.0)   . History of ADHD 11/01/2015  . Medical history non-contributory   . Mental disorder   . Nicotine dependence 11/03/2015  . Psychoactive substance-induced mood disorder ([place]) 10/28/2015  . Schizophrenia ([place])     History reviewed. No pertinent surgical history.  Family History: History reviewed. No pertinent family history.  Social History:  reports that he has been smoking cigarettes.  He has been smoking about 0.00 packs per day for the past 4.00 years. He has never used smokeless tobacco. He reports that he drinks alcohol. He reports that he has current or past drug history. Drugs: Marijuana, Cocaine, LSD, MDMA (Ecstacy), and Methamphetamines.  Additional Social History:  Alcohol / Drug Use Pain Medications: See PTA medication list Prescriptions: See PTA medication list Over the Counter: See PTA medication list History of alcohol / drug use?: Yes Substance #1 Name of Substance 1: Methamphetamine 1 - Age of First Use: 22 years of age 56 - Amount (size/oz): Unknown 1 - Frequency: A few times prior to 4 days ago. 1 - Duration: off and on 1 -  Last Use / Amount: 04/13  CIWA: CIWA-Ar BP: 119/73 Pulse Rate: (!) 109 COWS:    Allergies: No Known Allergies  Home Medications:  (Not in a hospital admission)  OB/GYN Status:  No LMP for male patient.  General Assessment Data Location of Assessment: [facility] ED TTS Assessment: In system Is this a Tele or Face-to-Face Assessment?: Tele Assessment Is this an Initial Assessment or a Re-assessment for this encounter?: Initial Assessment Marital status: Single Is patient pregnant?: No Pregnancy Status: No Living  Arrangements: Other (Comment)(Pt is homeless, livng in a tent.) Can pt return to current living arrangement?: Yes Admission Status: Voluntary Is patient capable of signing voluntary admission?: Yes Referral Source: Self/Family/Friend Insurance type: MCD     Crisis Care Plan Living Arrangements: Other (Comment)(Pt is homeless, livng in a tent.) Name of Psychiatrist: [facility] team Name of Therapist: [facility] ACTT team  Education Status Is patient currently in school?: No Current Grade: N/A Highest grade of school patient has completed: GED Name of school: NA Contact person: NA Is the patient employed, unemployed or receiving disability?: Receiving disability income  Risk to self with the past 6 months Suicidal Ideation: No-Not Currently/Within Last 6 Months Has patient been a risk to self within the past 6 months prior to admission? : Yes Suicidal Intent: No-Not Currently/Within Last 6 Months Has patient had any suicidal intent within the past 6 months prior to admission? : Yes Is patient at risk for suicide?: Yes Suicidal Plan?: No-Not Currently/Within Last 6 Months Has patient had any suicidal plan within the past 6 months prior to admission? : Yes Specify Current Suicidal Plan: Pt does not identify a plan now. Access to Means: Yes Specify Access to Suicidal Means: could overdose if he chose to What has been your use of drugs/alcohol within the last 12 months?: Meth Previous Attempts/Gestures: Yes How many times?: 2 Other Self Harm Risks: None Triggers for Past Attempts: Unpredictable Intentional Self Injurious Behavior: None Family Suicide History: Yes(Grandfather ) Recent stressful life event(s): Other (Comment)(Homelessness) Persecutory voices/beliefs?: Yes Depression: Yes Depression Symptoms: Loss of interest in usual pleasures, Feeling worthless/self pity, Insomnia Substance abuse history and/or treatment for substance abuse?: Yes Suicide prevention information given to  non-admitted patients: Not applicable  Risk to Others within the past 6 months Homicidal Ideation: No Does patient have any lifetime risk of violence toward others beyond the six months prior to admission? : No Thoughts of Harm to Others: No Current Homicidal Intent: No Current Homicidal Plan: No Access to Homicidal Means: No Identified Victim: No one History of harm to others?: Yes Assessment of Violence: In distant past Violent Behavior Description: "when I'm on drugs" Does patient have access to weapons?: No Criminal Charges Pending?: Yes Describe Pending Criminal Charges: Misdemeanor larceny Does patient have a court date: Yes Court Date: 02/08/18 Is patient on probation?: Yes(Pt cannot name P.O.)  Psychosis Hallucinations: Auditory(Voices tell him things are going to happen.) Delusions: None noted  Mental Status Report Appearance/Hygiene: Body odor, Poor hygiene, In scrubs Eye Contact: Fair Motor Activity: Freedom of movement, Unremarkable Speech: Logical/coherent Level of Consciousness: Alert Mood: Depressed, Despair, Sad Affect: Depressed, Blunted Anxiety Level: Moderate Thought Processes: Coherent, Relevant Judgement: Impaired Orientation: Person, Place, Situation Obsessive Compulsive Thoughts/Behaviors: None  Cognitive Functioning Concentration: Poor Memory: Recent Impaired, Remote Intact Is patient IDD: No Is patient DD?: Yes Insight: Fair Impulse Control: Poor Appetite: Good Have you had any weight changes? : No Change Sleep: No Change Total Hours of Sleep: 8 Vegetative Symptoms: None  ADLScreening [facility] Assessment Services) Patient's cognitive ability adequate to safely complete daily activities?: Yes Patient able to express need for assistance with ADLs?: Yes Independently performs ADLs?: Yes (appropriate for developmental age)  Prior Inpatient Therapy Prior Inpatient Therapy: Yes Prior Therapy Dates: 2019 Prior Therapy Facilty/Provider(s):  [facility] Reason for Treatment: MH issues  Prior Outpatient Therapy Prior Outpatient Therapy: Yes Prior Therapy Dates: Current Prior Therapy Facilty/Provider(s): [facility] Reason for Treatment: Medication management and counseling.  Does patient have an ACCT team?: No Does patient have Intensive In-House Services?  : No Does patient have [facility] services? : No Does patient have P4CC services?: No  ADL Screening (condition at time of admission) Patient's cognitive ability adequate to safely complete daily activities?: Yes Is the patient deaf or have difficulty hearing?: No Does the patient have difficulty seeing, even when wearing glasses/contacts?: No Does the patient have difficulty concentrating, remembering, or making decisions?: Yes Patient able to express need for assistance with ADLs?: Yes Does the patient have difficulty dressing or bathing?: No Independently performs ADLs?: Yes (appropriate for developmental age) Does the patient have difficulty walking or climbing stairs?: No Weakness of Legs: None Weakness of Arms/Hands: None       Abuse/Neglect Assessment (Assessment to be complete while patient is alone) Physical Abuse: Denies Verbal Abuse: Denies Sexual Abuse: Denies Exploitation of patient/patient's resources: Denies Self-Neglect: Denies     Regulatory affairs officer (For Healthcare) Does Patient Have a Medical Advance Directive?: No Would patient like information on creating a medical advance directive?: No - Patient declined          Disposition:  Disposition Initial Assessment Completed for this Encounter: Yes Patient referred to: Other (Comment)(To be reivewed by PA)  This service was provided via telemedicine using a 2-way, interactive audio and video technology.  Names of all persons participating in this telemedicine service and their role in this encounter. Name:  Role:   Name:  Role:   Name:  Role:   Name:  Role:     Raymondo Band 01/30/2018  1:30 AM

## 2018-01-30 NOTE — Consult Note (Signed)
  Tele Assessment   Thomas [name], 22 y.o., male [facility] with complaints of suicidal ideation.  Patient seen via telepsych by this provider; chart reviewed and consulted with Dr. Lucianne MussKumar on 01/30/18.  Patient has multiple presentations to ED with similar complaints after substance use.  Patient UDS positive for cocaine and Amphetamines.  On evaluation Thomas [name] reports that he came to the hospital because he wants to get helps with his drug problem.  "I've went to [facility] several times trying to get help and they dysemia out.  "I overdosed on meth and heroin the other day I need to get help.  I need to get mental help to make sure that my medicine is right."  Patient reports that he has a ACTT team through [facility] but he only sees them maybe once every other week.  States that he tries to take his medication as ordered when he has them.  At this time patient denies suicidal/self-harm/homicidal ideations, psychosis, and paranoia.  He reports "whenever I do drugs when on high I get suicidal and I hear voices."Patient is also asking for a phone so that he can make phone calls and set up appointments.  States that he is spoken with his ACTT several times about getting him a phone but they have not gotten him one yet.  Also states that he needs clothing presentable for him to go on interviews since he is applied for jobs.  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  During evaluation Thomas [name] is alert/oriented x 4; and  Calm/cooperative. He does not appear to be responding to internal/external stimuli or delusional thoughts.  Patient denies suicidal/self-harm/homicidal ideation, psychosis, and paranoia.  Patient answered question appropriately.  Patient has an ACTT team through [facility] whom TTS will try to contact and have pick patient up and set up  follow-up.  Also discussed with patient [facility] where their phones and showers he can use and a Child psychotherapistsocial worker who can also assist him.  Recommendations: Patient psychiatrically cleared to follow-up with current outpatient psychiatric provider and his ACTT team.  We will send resource information for community resources for substance use and short/long-term rehab facility information.  Disposition: No evidence of imminent risk to self or others at present.   Patient does not meet criteria for psychiatric inpatient admission. Refer to IOP. Discussed crisis plan, support from social network, calling 911, coming to the Emergency Department, and calling Suicide Hotline.   Thomas FoundShuvon Reco Shonk, NP

## 2018-01-30 NOTE — ED Notes (Signed)
TTS at bedside. 

## 2018-02-03 ENCOUNTER — Emergency Department (HOSPITAL_COMMUNITY): Payer: Medicaid Other

## 2018-02-03 ENCOUNTER — Other Ambulatory Visit: Payer: Self-pay

## 2018-02-03 ENCOUNTER — Inpatient Hospital Stay (HOSPITAL_COMMUNITY)
Admission: EM | Admit: 2018-02-03 | Discharge: 2018-02-04 | DRG: 872 | Discharge status: Against Medical Advice | Payer: Medicaid Other | Attending: Internal Medicine | Admitting: Internal Medicine

## 2018-02-03 ENCOUNTER — Encounter (HOSPITAL_COMMUNITY): Payer: Self-pay | Admitting: Emergency Medicine

## 2018-02-03 DIAGNOSIS — F319 Bipolar disorder, unspecified: Secondary | ICD-10-CM | POA: Diagnosis not present

## 2018-02-03 DIAGNOSIS — R6510 Systemic inflammatory response syndrome (SIRS) of non-infectious origin without acute organ dysfunction: Secondary | ICD-10-CM | POA: Diagnosis not present

## 2018-02-03 DIAGNOSIS — A419 Sepsis, unspecified organism: Principal | ICD-10-CM | POA: Diagnosis present

## 2018-02-03 DIAGNOSIS — R45851 Suicidal ideations: Secondary | ICD-10-CM | POA: Diagnosis present

## 2018-02-03 DIAGNOSIS — T401X1A Poisoning by heroin, accidental (unintentional), initial encounter: Secondary | ICD-10-CM | POA: Diagnosis present

## 2018-02-03 DIAGNOSIS — T1491XA Suicide attempt, initial encounter: Secondary | ICD-10-CM | POA: Diagnosis not present

## 2018-02-03 DIAGNOSIS — F1920 Other psychoactive substance dependence, uncomplicated: Secondary | ICD-10-CM | POA: Diagnosis present

## 2018-02-03 DIAGNOSIS — F1010 Alcohol abuse, uncomplicated: Secondary | ICD-10-CM

## 2018-02-03 DIAGNOSIS — T401X2A Poisoning by heroin, intentional self-harm, initial encounter: Secondary | ICD-10-CM | POA: Diagnosis present

## 2018-02-03 DIAGNOSIS — Z915 Personal history of self-harm: Secondary | ICD-10-CM

## 2018-02-03 DIAGNOSIS — R502 Drug induced fever: Secondary | ICD-10-CM | POA: Diagnosis not present

## 2018-02-03 DIAGNOSIS — F3130 Bipolar disorder, current episode depressed, mild or moderate severity, unspecified: Secondary | ICD-10-CM | POA: Diagnosis present

## 2018-02-03 DIAGNOSIS — F17210 Nicotine dependence, cigarettes, uncomplicated: Secondary | ICD-10-CM | POA: Diagnosis present

## 2018-02-03 DIAGNOSIS — R509 Fever, unspecified: Secondary | ICD-10-CM | POA: Diagnosis not present

## 2018-02-03 DIAGNOSIS — Z590 Homelessness: Secondary | ICD-10-CM | POA: Diagnosis not present

## 2018-02-03 DIAGNOSIS — Z79899 Other long term (current) drug therapy: Secondary | ICD-10-CM | POA: Diagnosis not present

## 2018-02-03 DIAGNOSIS — F909 Attention-deficit hyperactivity disorder, unspecified type: Secondary | ICD-10-CM | POA: Diagnosis present

## 2018-02-03 DIAGNOSIS — F209 Schizophrenia, unspecified: Secondary | ICD-10-CM | POA: Diagnosis present

## 2018-02-03 DIAGNOSIS — T405X2A Poisoning by cocaine, intentional self-harm, initial encounter: Secondary | ICD-10-CM | POA: Diagnosis not present

## 2018-02-03 DIAGNOSIS — T401X1S Poisoning by heroin, accidental (unintentional), sequela: Secondary | ICD-10-CM | POA: Diagnosis not present

## 2018-02-03 DIAGNOSIS — R110 Nausea: Secondary | ICD-10-CM

## 2018-02-03 DIAGNOSIS — F1910 Other psychoactive substance abuse, uncomplicated: Secondary | ICD-10-CM | POA: Diagnosis present

## 2018-02-03 LAB — CBC WITH DIFFERENTIAL/PLATELET
BASOS ABS: 0 10*3/uL (ref 0.0–0.1)
BASOS PCT: 0 %
EOS ABS: 0 10*3/uL (ref 0.0–0.7)
Eosinophils Relative: 0 %
HCT: 38.9 % — ABNORMAL LOW (ref 39.0–52.0)
Hemoglobin: 13.7 g/dL (ref 13.0–17.0)
LYMPHS PCT: 5 %
Lymphs Abs: 0.5 10*3/uL — ABNORMAL LOW (ref 0.7–4.0)
MCH: 31.4 pg (ref 26.0–34.0)
MCHC: 35.2 g/dL (ref 30.0–36.0)
MCV: 89 fL (ref 78.0–100.0)
Monocytes Absolute: 0.5 10*3/uL (ref 0.1–1.0)
Monocytes Relative: 5 %
Neutro Abs: 9.3 10*3/uL — ABNORMAL HIGH (ref 1.7–7.7)
Neutrophils Relative %: 90 %
PLATELETS: 188 10*3/uL (ref 150–400)
RBC: 4.37 MIL/uL (ref 4.22–5.81)
RDW: 12.6 % (ref 11.5–15.5)
WBC: 10.3 10*3/uL (ref 4.0–10.5)

## 2018-02-03 LAB — RAPID URINE DRUG SCREEN, HOSP PERFORMED
Amphetamines: POSITIVE — AB
BENZODIAZEPINES: NOT DETECTED
Barbiturates: NOT DETECTED
Cocaine: POSITIVE — AB
Opiates: POSITIVE — AB
Tetrahydrocannabinol: NOT DETECTED

## 2018-02-03 LAB — URINALYSIS, ROUTINE W REFLEX MICROSCOPIC
Bilirubin Urine: NEGATIVE
Glucose, UA: NEGATIVE mg/dL
Hgb urine dipstick: NEGATIVE
KETONES UR: NEGATIVE mg/dL
LEUKOCYTES UA: NEGATIVE
NITRITE: NEGATIVE
PROTEIN: NEGATIVE mg/dL
Specific Gravity, Urine: 1.005 (ref 1.005–1.030)
pH: 5 (ref 5.0–8.0)

## 2018-02-03 LAB — COMPREHENSIVE METABOLIC PANEL
ALK PHOS: 58 U/L (ref 38–126)
ALT: 25 U/L (ref 17–63)
ANION GAP: 9 (ref 5–15)
AST: 20 U/L (ref 15–41)
Albumin: 4.1 g/dL (ref 3.5–5.0)
BUN: 13 mg/dL (ref 6–20)
CALCIUM: 9.2 mg/dL (ref 8.9–10.3)
CHLORIDE: 98 mmol/L — AB (ref 101–111)
CO2: 27 mmol/L (ref 22–32)
Creatinine, Ser: 0.97 mg/dL (ref 0.61–1.24)
GFR calc non Af Amer: >60 mL/min (ref >60)
Glucose, Bld: 96 mg/dL (ref 65–99)
Potassium: 3.7 mmol/L (ref 3.5–5.1)
Sodium: 134 mmol/L — ABNORMAL LOW (ref 135–145)
Total Bilirubin: 0.7 mg/dL (ref 0.3–1.2)
Total Protein: 7.5 g/dL (ref 6.5–8.1)

## 2018-02-03 LAB — INFLUENZA PANEL BY PCR (TYPE A & B)
INFLAPCR: NEGATIVE
INFLBPCR: NEGATIVE

## 2018-02-03 LAB — CBG MONITORING, ED: GLUCOSE-CAPILLARY: 94 mg/dL (ref 65–99)

## 2018-02-03 LAB — TROPONIN I
Troponin I: <0.03 ng/mL (ref <0.03)
Troponin I: <0.03 ng/mL (ref <0.03)

## 2018-02-03 LAB — ETHANOL

## 2018-02-03 LAB — I-STAT CG4 LACTIC ACID, ED: LACTIC ACID, VENOUS: 1.27 mmol/L (ref 0.5–1.9)

## 2018-02-03 LAB — ACETAMINOPHEN LEVEL

## 2018-02-03 LAB — SALICYLATE LEVEL

## 2018-02-03 LAB — LITHIUM LEVEL: Lithium Lvl: <0.06 mmol/L — ABNORMAL LOW (ref 0.60–1.20)

## 2018-02-03 MED ORDER — CLONIDINE HCL 0.1 MG PO TABS: 0.1000 mg | ORAL_TABLET | ORAL | Status: DC

## 2018-02-03 MED ORDER — SODIUM CHLORIDE 0.9 % IV BOLUS (SEPSIS)
250.0000 mL | Freq: Once | INTRAVENOUS | Status: AC
  Administered 2018-02-03: 250 mL via INTRAVENOUS

## 2018-02-03 MED ORDER — SODIUM CHLORIDE 0.9 % IV BOLUS (SEPSIS)
1000.0000 mL | Freq: Once | INTRAVENOUS | Status: AC
  Administered 2018-02-03: 1000 mL via INTRAVENOUS

## 2018-02-03 MED ORDER — VANCOMYCIN HCL 10 G IV SOLR
1250.0000 mg | Freq: Two times a day (BID) | INTRAVENOUS | Status: DC
  Administered 2018-02-03 – 2018-02-04 (×2): 1250 mg via INTRAVENOUS
  Filled 2018-02-03 (×2): qty 1250

## 2018-02-03 MED ORDER — NALOXONE HCL 0.4 MG/ML IJ SOLN: 0.4000 mg | INTRAMUSCULAR | Status: DC | PRN

## 2018-02-03 MED ORDER — VANCOMYCIN HCL IN DEXTROSE 1-5 GM/200ML-% IV SOLN: 1000.0000 mg | Freq: Once | INTRAVENOUS | Status: DC

## 2018-02-03 MED ORDER — PIPERACILLIN-TAZOBACTAM 3.375 G IVPB
3.3750 g | Freq: Three times a day (TID) | INTRAVENOUS | Status: DC
  Administered 2018-02-03 – 2018-02-04 (×3): 3.375 g via INTRAVENOUS
  Filled 2018-02-03 (×5): qty 50

## 2018-02-03 MED ORDER — BUSPIRONE HCL 10 MG PO TABS
5.0000 mg | ORAL_TABLET | Freq: Three times a day (TID) | ORAL | Status: DC
  Administered 2018-02-03 – 2018-02-04 (×4): 5 mg via ORAL
  Filled 2018-02-03 (×4): qty 1

## 2018-02-03 MED ORDER — LOPERAMIDE HCL 2 MG PO CAPS: 2.0000 mg | ORAL_CAPSULE | ORAL | Status: DC | PRN

## 2018-02-03 MED ORDER — CLONIDINE HCL 0.1 MG PO TABS
0.1000 mg | ORAL_TABLET | Freq: Four times a day (QID) | ORAL | Status: DC
  Administered 2018-02-03 – 2018-02-04 (×4): 0.1 mg via ORAL
  Filled 2018-02-03 (×5): qty 1

## 2018-02-03 MED ORDER — METHOCARBAMOL 500 MG PO TABS
500.0000 mg | ORAL_TABLET | Freq: Three times a day (TID) | ORAL | Status: DC | PRN
  Administered 2018-02-03: 500 mg via ORAL
  Filled 2018-02-03: qty 1

## 2018-02-03 MED ORDER — ACETAMINOPHEN 650 MG RE SUPP: 650.0000 mg | Freq: Four times a day (QID) | RECTAL | Status: DC | PRN

## 2018-02-03 MED ORDER — SODIUM CHLORIDE 0.9 % IV SOLN
INTRAVENOUS | Status: AC
  Administered 2018-02-03 – 2018-02-04 (×2): via INTRAVENOUS

## 2018-02-03 MED ORDER — NAPROXEN 250 MG PO TABS
500.0000 mg | ORAL_TABLET | Freq: Two times a day (BID) | ORAL | Status: DC | PRN
  Filled 2018-02-03: qty 2

## 2018-02-03 MED ORDER — ACETAMINOPHEN 650 MG RE SUPP
650.0000 mg | Freq: Once | RECTAL | Status: AC
  Administered 2018-02-03: 650 mg via RECTAL
  Filled 2018-02-03: qty 1

## 2018-02-03 MED ORDER — ENOXAPARIN SODIUM 40 MG/0.4ML ~~LOC~~ SOLN
40.0000 mg | SUBCUTANEOUS | Status: DC
  Administered 2018-02-03: 40 mg via SUBCUTANEOUS
  Filled 2018-02-03: qty 0.4

## 2018-02-03 MED ORDER — ACETAMINOPHEN 500 MG PO TABS: 1000.0000 mg | ORAL_TABLET | Freq: Once | ORAL | Status: DC

## 2018-02-03 MED ORDER — BUPROPION HCL ER (XL) 300 MG PO TB24
300.0000 mg | ORAL_TABLET | Freq: Every day | ORAL | Status: DC
  Administered 2018-02-03 – 2018-02-04 (×2): 300 mg via ORAL
  Filled 2018-02-03 (×2): qty 1

## 2018-02-03 MED ORDER — ONDANSETRON 4 MG PO TBDP
4.0000 mg | ORAL_TABLET | Freq: Four times a day (QID) | ORAL | Status: DC | PRN
  Filled 2018-02-03 (×2): qty 1

## 2018-02-03 MED ORDER — PIPERACILLIN-TAZOBACTAM 3.375 G IVPB 30 MIN
3.3750 g | Freq: Once | INTRAVENOUS | Status: AC
  Administered 2018-02-03: 3.375 g via INTRAVENOUS
  Filled 2018-02-03: qty 50

## 2018-02-03 MED ORDER — VANCOMYCIN HCL IN DEXTROSE 750-5 MG/150ML-% IV SOLN
750.0000 mg | Freq: Once | INTRAVENOUS | Status: AC
  Administered 2018-02-03: 750 mg via INTRAVENOUS
  Filled 2018-02-03 (×2): qty 150

## 2018-02-03 MED ORDER — ACETAMINOPHEN 325 MG PO TABS
650.0000 mg | ORAL_TABLET | Freq: Four times a day (QID) | ORAL | Status: DC | PRN
  Administered 2018-02-03: 650 mg via ORAL
  Filled 2018-02-03: qty 2

## 2018-02-03 MED ORDER — VANCOMYCIN HCL 10 G IV SOLR
1500.0000 mg | Freq: Once | INTRAVENOUS | Status: AC
  Administered 2018-02-03: 1500 mg via INTRAVENOUS
  Filled 2018-02-03: qty 1500

## 2018-02-03 MED ORDER — DICYCLOMINE HCL 20 MG PO TABS
20.0000 mg | ORAL_TABLET | Freq: Four times a day (QID) | ORAL | Status: DC | PRN
  Filled 2018-02-03: qty 1

## 2018-02-03 MED ORDER — LITHIUM CARBONATE ER 300 MG PO TBCR
600.0000 mg | EXTENDED_RELEASE_TABLET | Freq: Two times a day (BID) | ORAL | Status: DC
  Administered 2018-02-03 – 2018-02-04 (×3): 600 mg via ORAL
  Filled 2018-02-03 (×4): qty 2

## 2018-02-03 MED ORDER — CLONIDINE HCL 0.1 MG PO TABS: 0.1000 mg | ORAL_TABLET | Freq: Every day | ORAL | Status: DC

## 2018-02-03 MED ORDER — HYDROXYZINE HCL 25 MG PO TABS
25.0000 mg | ORAL_TABLET | Freq: Four times a day (QID) | ORAL | Status: DC | PRN
  Administered 2018-02-03: 25 mg via ORAL
  Filled 2018-02-03: qty 1

## 2018-02-03 NOTE — ED Triage Notes (Signed)
Pt presents by EMS for evaluation of SI along with drug overdose of Heroine and alcohol. Pt drowsy and having difficulty staying alert and slurring words.

## 2018-02-03 NOTE — ED Notes (Signed)
Bed: [facility] Expected date:  Expected time:  Means of arrival:  Comments: 

## 2018-02-03 NOTE — H&P (Addendum)
TRH H&P   Patient Demographics:    Thomas [name], is a 22 y.o. male  MRN: [id number]   DOB - Feb 17, 1996  Admit Date - 02/03/2018  Outpatient Primary MD for the patient is [place]  Referring MD/NP/PA:   Montine Circle  Outpatient Specialists:    Patient coming from:  Home  ?  Chief Complaint  Patient presents with  . Drug Overdose  . Suicidal      HPI:    Thomas [name]  is a 22 y.o. male, w ADHD, Bipolar do, Schizophrenia, polysbustance abuse apparently was brought in by EMS w drug overdose of heroin, and etoh and ? Suicidal ideation.   In ED,  Pt received ns as well as narcan, and vanco iv, and zosyn iv in the ED.    CXR IMPRESSION: Vascular congestion.  Lungs remain grossly clear.  Na 134, K 3.7 Bun 13, Creatinine 0.97 Ast 20, Alt 25 ETOH level <84 Salicylate <7 Tylenol <69  Wbc 10.3, Hgb 13.7, Plt 188  Influenza negative  UDS  Opioid +, coccaine +, amphetamine +  Pt will be admitted for drug overdose? As well as suicidal ideation and fever, ? sepsis     Review of systems:    In addition to the HPI above,  No Fever-chills, No Headache, No changes with Vision or hearing, No problems swallowing food or Liquids, No Chest pain, Cough or Shortness of Breath, No Abdominal pain, No Nausea or Vommitting, Bowel movements are regular, No Blood in stool or Urine, No dysuria, No new skin rashes or bruises, No new joints pains-aches,  No new weakness, tingling, numbness in any extremity, No recent weight gain or loss, No polyuria, polydypsia or polyphagia, No significant Mental Stressors.  A full 10 point Review of Systems was done, except as stated above, all other Review of Systems were negative.   With Past History of the following :    Past Medical History:  Diagnosis Date  . ADHD (attention deficit hyperactivity disorder)     . Anxiety   . Bipolar 1 disorder ([place])   . Current smoker   . Depression   . Eating disorder   . Headache(784.0)   . History of ADHD 11/01/2015  . Medical history non-contributory   . Mental disorder   . Nicotine dependence 11/03/2015  . 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 Schizophrenia ([place])       History reviewed. No pertinent surgical history.    Social History:     Social History   Tobacco Use  . Smoking status: Current Some Day Smoker    Packs/day: 0.00    Years: 4.00    Pack years: 0.00    Types: Cigarettes  . Smokeless tobacco: Never Used  Substance Use Topics  . Alcohol use: Yes     Lives - at home  Mobility - walks by self  Family History :    History reviewed. No pertinent family history. Unable to obtain due to somnolence of patient   Home Medications:   Prior to Admission medications   Medication Sig Start Date End Date Taking? Authorizing Provider  buPROPion (WELLBUTRIN XL) 300 MG 24 hr tablet Take 300 mg by mouth daily.   Yes [provider]  busPIRone (BUSPAR) 5 MG tablet Take 5 mg by mouth 3 (three) times daily.   Yes [provider]  lithium carbonate (LITHOBID) 300 MG CR tablet Take 600 mg by mouth 2 (two) times daily.   Yes [provider]  sulfamethoxazole-trimethoprim (BACTRIM DS,SEPTRA DS) 800-160 MG tablet Take 1 tablet by mouth 2 (two) times daily for 7 days. 01/30/18 02/06/18 Yes Julianne Rice, MD     Allergies:    No Known Allergies   Physical Exam:   Vitals  Blood pressure (!) 110/52, pulse 98, temperature (!) 100.9 F (38.3 C), temperature source Axillary, resp. rate 13, height _0  (1.753 m), weight 74.8 kg (165 lb), SpO2 95 %.   1. General  lying in bed in NAD,  sleeping  2. Normal affect and poor  insight, somnolent Unable to provide accurate history  3. No F.N deficits, ALL C.Nerves Intact, Strength 5/5 all 4 extremities, Sensation intact all 4 extremities,  Plantars down going.  4. Ears and Eyes appear Normal, Conjunctivae clear, PERRLA. Moist Oral Mucosa.  5. Supple Neck, No JVD, No cervical lymphadenopathy appriciated, No Carotid Bruits.  6. Symmetrical Chest wall movement, Good air movement bilaterally, CTAB.  7. RRR, No Gallops, Rubs or Murmurs, No Parasternal Heave.  8. Positive Bowel Sounds, Abdomen Soft, No tenderness, No organomegaly appriciated,No rebound -guarding or rigidity.  9.  No Cyanosis, Normal Skin Turgor, No Skin Rash or Bruise.  10. Good muscle tone,  joints appear normal , no effusions, Normal ROM.  11. No Palpable Lymph Nodes in Neck or Axillae  No splinter, no janeway, no osler    Data Review:    CBC Recent Labs  Lab 01/27/18 1413 01/29/18 1810 02/03/18 0225  WBC 9.6 8.8 10.3  HGB 13.9 14.4 13.7  HCT 40.2 40.9 38.9*  PLT 202 205 188  MCV 89.7 88.9 89.0  MCH 31.0 31.3 31.4  MCHC 34.6 35.2 35.2  RDW 13.1 13.1 12.6  LYMPHSABS 1.4  --  0.5*  MONOABS 0.8  --  0.5  EOSABS 0.1  --  0.0  BASOSABS 0.0  --  0.0   ------------------------------------------------------------------------------------------------------------------  Chemistries  Recent Labs  Lab 01/27/18 1413 01/29/18 1810 02/03/18 0225  NA 138 135 134*  K 3.8 3.9 3.7  CL 102 100* 98*  CO2 _1 GLUCOSE 134* 87 96  BUN _2 CREATININE 0.84 0.94 0.97  CALCIUM 9.3 9.4 9.2  AST _3 ALT _4 ALKPHOS 58 61 58  BILITOT 0.8 0.9 0.7   ------------------------------------------------------------------------------------------------------------------ estimated creatinine clearance is 122.5 mL/min (by C-G formula based on SCr of 0.97 mg/dL). ------------------------------------------------------------------------------------------------------------------ No results for input(s): TSH, T4TOTAL, T3FREE, THYROIDAB in the last 72 hours.  Invalid input(s): FREET3  Coagulation profile No results for input(s): INR, PROTIME in  the last 168 hours. ------------------------------------------------------------------------------------------------------------------- No results for input(s): DDIMER in the last 72 hours. -------------------------------------------------------------------------------------------------------------------  Cardiac Enzymes No results for input(s): CKMB, TROPONINI, MYOGLOBIN in the last 168 hours.  Invalid input(s): CK ------------------------------------------------------------------------------------------------------------------ No results found for: BNP   ---------------------------------------------------------------------------------------------------------------  Urinalysis    Component Value Date/Time  COLORURINE STRAW (A) 02/03/2018 0400   APPEARANCEUR CLEAR 02/03/2018 0400   LABSPEC 1.005 02/03/2018 0400   PHURINE 5.0 02/03/2018 0400   GLUCOSEU NEGATIVE 02/03/2018 0400   HGBUR NEGATIVE 02/03/2018 0400   BILIRUBINUR NEGATIVE 02/03/2018 0400   KETONESUR NEGATIVE 02/03/2018 0400   PROTEINUR NEGATIVE 02/03/2018 0400   UROBILINOGEN 0.2 02/19/2015 1954   NITRITE NEGATIVE 02/03/2018 0400   LEUKOCYTESUR NEGATIVE 02/03/2018 0400    ----------------------------------------------------------------------------------------------------------------   Imaging Results:    Dg Chest Port 1 View  Result Date: 02/03/2018 CLINICAL DATA:  Acute onset of drowsiness. Suicidal ideation. Drug overdose. EXAM: PORTABLE CHEST 1 VIEW COMPARISON:  Chest radiograph performed 01/06/2018 FINDINGS: The lungs are well-aerated. Vascular congestion is noted. There is no evidence of focal opacification, pleural effusion or pneumothorax. The cardiomediastinal silhouette is within normal limits. No acute osseous abnormalities are seen. IMPRESSION: Vascular congestion.  Lungs remain grossly clear. Electronically Signed   By: Garald Balding M.D.   On: 02/03/2018 02:45       Assessment & Plan:    Active  Problems:   Fever    Sepsis, (Fever, tachycardia, hypotension) Blood culture x2 Vanco iv , zosyn iv pharmacy to dose  Tachycardia Tele Trop I q6h x3 Check TSH Check cardiac echo  Fever unclear source Blood culture pending Check ESR, Crp Check cardiac echo search for endocarditis  Heroin overdose Suicidal ideation 1:1 direct obs for safety Please consult psychiatry in the am  Depression, Bipolar Check lithium level Cont Lithium Cont buspar Cont wellbutrin         DVT Prophylaxis Lovenox - SCDs  AM Labs Ordered, also please review Full Orders  Family Communication: Admission, patients condition and plan of care including tests being ordered have been discussed with the patient  who indicate understanding and agree with the plan and Code Status.  Code Status FULL CODE  Likely DC to   home  Condition GUARDED    Consults called: none  Admission status: inpatient   Time spent in minutes : 45   Jani Gravel M.D on 02/03/2018 at 5:25 AM  Between 7am to 7pm - Pager - [phone]  After 7pm go to [email or web address] - password [facility]  [facility] - Office  [phone]

## 2018-02-03 NOTE — ED Notes (Signed)
ED TO INPATIENT HANDOFF REPORT  Name/Age/Gender Thomas [name] 22 y.o. male  Code Status Code Status History    Date Active Date Inactive Code Status Order ID Comments User Context   01/30/2018 0027 01/30/2018 1947 Full Code [id number]  Ward, Ozella Almond, PA-C ED   01/18/2018 0437 01/18/2018 1010 Full Code [id number]  Ward, Ozella Almond, PA-C ED   01/06/2018 1211 01/07/2018 1438 Full Code [id number]  Damita Lack, MD ED   01/03/2018 0615 01/03/2018 1200 Full Code [id number]  Delora Fuel, MD ED   01/03/2018 0047 01/03/2018 0615 Full Code [id number]  Montine Circle, PA-C ED   12/24/2017 1055 12/25/2017 0218 Full Code [id number]  Macarthur Critchley, MD ED   12/18/2017 0337 12/18/2017 2026 Full Code [id number]  Veryl Speak, MD ED   12/10/2017 2216 12/11/2017 1538 Full Code [id number]  Dalia Heading, PA-C ED   12/07/2017 2151 12/08/2017 1346 Full Code [id number]  Malvin Johns, MD ED   10/11/2017 1714 10/15/2017 1447 Full Code [id number]  Judeth Horn, MD ED   10/05/2017 0102 10/05/2017 0500 Full Code [id number]  Muthersbaugh, Gwenlyn Perking ED   10/03/2017 0412 10/03/2017 1333 Full Code [id number]  Antonietta Breach, PA-C ED   09/24/2017 1418 09/25/2017 1303 Full Code [id number]  Dorie Rank, MD ED   08/10/2017 0223 08/10/2017 1621 Full Code [id number]  Ward, Delice Bison, DO ED   08/08/2017 0227 08/08/2017 1433 Full Code [id number]  Orpah Greek, MD ED   08/07/2017 0339 08/07/2017 1552 Full Code [id number]  Orpah Greek, MD ED   08/03/2017 2224 08/04/2017 1538 Full Code [id number]  Tegeler, Gwenyth Allegra, MD ED   08/03/2017 0235 08/03/2017 1606 Full Code [id number]  Charlann Lange, PA-C ED   07/16/2017 2028 07/17/2017 1419 Full Code [id number]  Lorin Glass, PA-C ED   06/01/2017 0000 06/01/2017 1459 Full Code [id number]  Deno Etienne, DO ED   05/29/2017 0852 05/30/2017 1521 Full Code [id number]  Dorie Rank, MD ED   05/09/2017 0012 05/09/2017 1407 Full Code [id number]  Shary Decamp, PA-C ED   05/06/2017 2015 05/07/2017 1346 Full Code [id number]  Nat Christen, MD ED   05/01/2017 0248 05/01/2017 1408 Full Code [id number]  Delora Fuel, MD ED   04/23/2017 2133 04/24/2017 1639 Full Code [id number]  Fredia Sorrow, MD ED   02/06/2017 0204 02/06/2017 1505 Full Code [id number]  Muthersbaugh, Gwenlyn Perking ED   01/22/2017 0046 01/22/2017 2147 Full Code [id number]  Charlesetta Shanks, MD ED   12/12/2016 0316 12/14/2016 1243 Full Code [id number]  Street, [place], [place] ED   12/05/2016 1545 12/07/2016 2027 Full Code [id number]  Kerney Elbe, DO Inpatient   12/02/2016 2247 12/03/2016 1357 Full Code [id number]  Forde Dandy, MD ED   11/28/2016 0248 11/28/2016 1059 Full Code [id number]  Orpah Greek, MD ED   11/20/2016 1537 11/24/2016 1610 Full Code [id number]  Elgergawy, Silver Huguenin, MD Inpatient   10/13/2016 1540 10/16/2016 1450 Full Code [id number]  Quintella Reichert, MD ED   10/07/2016 2332 10/09/2016 1416 Full Code [id number]  Forde Dandy, MD ED   09/11/2016 1943 09/13/2016 1441 Full Code [id number]  Leo Grosser, MD ED   09/08/2016 1345 09/08/2016 2018 Full Code [id number]  Larene Pickett, PA-C ED   09/01/2016 1504 09/02/2016 1542 Full Code [id number]  Blanchie Dessert, MD ED   08/25/2016 0052 08/25/2016 1501 Full Code [id number]  Varney Biles, MD ED   07/18/2016 2321 07/21/2016 1431 Full Code [id number]  Lacretia Leigh, MD ED   07/09/2016 1246 07/09/2016 1907  Full Code [id number]  Charlesetta Shanks, MD ED   06/30/2016 2053 07/05/2016 2058 Full Code [id number]  Gonzella Lex, MD Inpatient   06/03/2016 1517 06/06/2016 1741 Full Code [id number]  Charlynne Cousins, MD Inpatient   04/25/2016 0417 04/26/2016 1637 Full Code [id number]  Delora Fuel, MD ED   03/19/2016 0456 03/20/2016 1153 Full Code [id number]  Davonna Belling, MD ED   02/25/2016 0250 03/06/2016 2257 Full Code [id number]  Montine Circle, PA-C ED   02/19/2016 0834 02/23/2016 1955 Full Code [id number]  Ancil Linsey, RN ED   01/10/2016 1436 01/18/2016 1815 Full Code  [id number]  Rolla Plate, MD ED   12/14/2015 0701 12/15/2015 0010 Full Code [id number]  Orpah Greek, MD ED   10/28/2015 0402 11/03/2015 1804 Full Code [id number]  Laverle Hobby, PA-C Inpatient   10/27/2015 1308 10/28/2015 0402 Full Code [id number]  Deno Etienne, DO ED   07/07/2015 0358 07/08/2015 1254 Full Code [id number]  Rolland Porter, MD ED   07/23/2014 1333 07/29/2014 1516 Full Code [id number]  Delight Hoh, MD Inpatient   07/21/2014 2133 07/23/2014 1333 Full Code [id number]  Ephraim Hamburger, MD ED   04/11/2014 2119 04/20/2014 1506 Full Code [id number]  Lurena Nida, NP Inpatient   03/28/2014 1034 04/01/2014 2208 Full Code [id number]  Lurena Nida, NP Inpatient   03/27/2014 1058 03/28/2014 1034 Full Code [id number]  Madison Hickman, NP Inpatient   03/26/2014 2111 03/27/2014 1058 Full Code [id number]  Domenic Moras, PA-C ED   12/24/2013 0042 12/30/2013 1636 Full Code [id number]  Laverle Hobby, PA-C Inpatient   12/23/2013 0048 12/24/2013 0042 Full Code [id number]  Hoy Morn, MD ED   12/27/2012 2249 12/28/2012 2207 Full Code [id number]  Julianne Rice, MD ED   12/04/2012 0219 12/04/2012 1256 Full Code [id number]  Bednar, Latricia Heft, MD ED      Home/SNF/Other Home  Chief Complaint SI  Level of Care/Admitting Diagnosis ED Disposition    ED Disposition Condition Comment   [place]: [facility] [100102]  Level of Care: Telemetry [5]  Admit to tele based on following criteria: Monitor for Ischemic changes  Diagnosis: Heroin overdose [facility]) [[id number]]  Admitting Physician: Jani Gravel [[id number]]  Attending Physician: Jani Gravel [id number]  Estimated length of stay: past midnight tomorrow  Certification:: I certify this patient will need inpatient services for at least 2 midnights  PT Class (Do Not Modify): Inpatient [101]  PT Acc Code (Do Not Modify): Private [1]       Medical History Past Medical History:  Diagnosis Date  . ADHD (attention deficit hyperactivity disorder)    . Anxiety   . Bipolar 1 disorder ([place])   . Current smoker   . Depression   . Eating disorder   . Headache(784.0)   . History of ADHD 11/01/2015  . Medical history non-contributory   . Mental disorder   . Nicotine dependence 11/03/2015  . 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 Schizophrenia ([place])     Allergies No Known Allergies  IV Location/Drains/Wounds Patient Lines/Drains/Airways Status   Active Line/Drains/Airways    Name:   Placement date:   Placement time:   Site:   Days:   Peripheral IV 02/03/18 Left Antecubital   02/03/18    0224    Antecubital   less than 1          Labs/Imaging Results for orders placed or performed during the hospital encounter of 02/03/18 (from the past 48 hour(s))  Comprehensive metabolic panel     Status: Abnormal   Collection Time: 02/03/18  2:25 AM  Result Value Ref Range   Sodium 134 (L) 135 - 145 mmol/L   Potassium 3.7 3.5 - 5.1 mmol/L   Chloride 98 (L) 101 - 111 mmol/L   CO2 27 22 - 32 mmol/L   Glucose, Bld 96 65 - 99 mg/dL   BUN 13 6 - 20 mg/dL   Creatinine, Ser 0.97 0.61 - 1.24 mg/dL   Calcium 9.2 8.9 - 10.3 mg/dL   Total Protein 7.5 6.5 - 8.1 g/dL   Albumin 4.1 3.5 - 5.0 g/dL   AST 20 15 - 41 U/L   ALT 25 17 - 63 U/L   Alkaline Phosphatase 58 38 - 126 U/L   Total Bilirubin 0.7 0.3 - 1.2 mg/dL   GFR calc non Af Amer >60 >60 mL/min   GFR calc Af Amer >60 >60 mL/min    Comment: (NOTE) The eGFR has been calculated using the CKD EPI equation. This calculation has not been validated in all clinical situations. eGFR's persistently <60 mL/min signify possible Chronic Kidney Disease.    Anion gap 9 5 - 15    Comment: Performed at [facility], [place] [address], [place], [place] [address]  Ethanol     Status: None   Collection Time: 02/03/18  2:25 AM  Result Value Ref Range   Alcohol, Ethyl (B) <10 <10 mg/dL    Comment:        LOWEST DETECTABLE LIMIT FOR SERUM ALCOHOL IS 10 mg/dL FOR  MEDICAL PURPOSES ONLY Performed at [facility], [place] [address], [place], [place] [address]   Salicylate level     Status: None   Collection Time: 02/03/18  2:25 AM  Result Value Ref Range   Salicylate Lvl <8.4 2.8 - 30.0 mg/dL    Comment: Performed at [facility], [place] [address] [place], [place] [address]  Acetaminophen level     Status: Abnormal   Collection Time: 02/03/18  2:25 AM  Result Value Ref Range   Acetaminophen (Tylenol), Serum <10 (L) 10 - 30 ug/mL    Comment:        THERAPEUTIC CONCENTRATIONS VARY SIGNIFICANTLY. A RANGE OF 10-30 ug/mL MAY BE AN EFFECTIVE CONCENTRATION FOR MANY PATIENTS. HOWEVER, SOME ARE BEST TREATED AT CONCENTRATIONS OUTSIDE THIS RANGE. ACETAMINOPHEN CONCENTRATIONS >150 ug/mL AT 4 HOURS AFTER INGESTION AND >50 ug/mL AT 12 HOURS AFTER INGESTION ARE OFTEN ASSOCIATED WITH TOXIC REACTIONS. Performed at [facility], [place] [address] [place], [place] [address]   CBC WITH DIFFERENTIAL     Status: Abnormal   Collection Time: 02/03/18  2:25 AM  Result Value Ref Range   WBC 10.3 4.0 - 10.5 K/uL   RBC 4.37 4.22 - 5.81 MIL/uL   Hemoglobin 13.7 13.0 - 17.0 g/dL   HCT 38.9 (L) 39.0 - 52.0 %   MCV 89.0 78.0 - 100.0 fL   MCH 31.4 26.0 - 34.0 pg   MCHC 35.2 30.0 - 36.0 g/dL   RDW 12.6 11.5 - 15.5 %   Platelets 188 150 - 400 K/uL   Neutrophils Relative % 90 %   Neutro Abs 9.3 (H) 1.7 - 7.7 K/uL   Lymphocytes Relative 5 %   Lymphs Abs 0.5 (L) 0.7 - 4.0 K/uL   Monocytes Relative 5 %   Monocytes Absolute 0.5 0.1 - 1.0 K/uL   Eosinophils Relative 0 %   Eosinophils Absolute 0.0 0.0 -  0.7 K/uL   Basophils Relative 0 %   Basophils Absolute 0.0 0.0 - 0.1 K/uL    Comment: Performed at [facility], [place] [address] [place], [place] [address]  I-Stat CG4 Lactic Acid, ED  (not at  [facility])     Status: None   Collection Time: 02/03/18  2:38 AM  Result Value Ref Range   Lactic Acid, Venous  1.27 0.5 - 1.9 mmol/L  CBG monitoring, ED     Status: None   Collection Time: 02/03/18  3:02 AM  Result Value Ref Range   Glucose-Capillary 94 65 - 99 mg/dL   Comment 1 Notify RN    Comment 2 Document in Chart   Rapid urine drug screen (hospital performed)     Status: Abnormal   Collection Time: 02/03/18  4:00 AM  Result Value Ref Range   Opiates POSITIVE (A) NONE DETECTED   Cocaine POSITIVE (A) NONE DETECTED   Benzodiazepines NONE DETECTED NONE DETECTED   Amphetamines POSITIVE (A) NONE DETECTED   Tetrahydrocannabinol NONE DETECTED NONE DETECTED   Barbiturates NONE DETECTED NONE DETECTED    Comment: (NOTE) DRUG SCREEN FOR MEDICAL PURPOSES ONLY.  IF CONFIRMATION IS NEEDED FOR ANY PURPOSE, NOTIFY LAB WITHIN 5 DAYS. LOWEST DETECTABLE LIMITS FOR URINE DRUG SCREEN Drug Class                     Cutoff (ng/mL) Amphetamine and metabolites    1000 Barbiturate and metabolites    200 Benzodiazepine                 950 Tricyclics and metabolites     300 Opiates and metabolites        300 Cocaine and metabolites        300 THC                            50 Performed at [facility], [place] [address], [place], [place] [address]   Urinalysis, Routine w reflex microscopic     Status: Abnormal   Collection Time: 02/03/18  4:00 AM  Result Value Ref Range   Color, Urine STRAW (A) YELLOW   APPearance CLEAR CLEAR   Specific Gravity, Urine 1.005 1.005 - 1.030   pH 5.0 5.0 - 8.0   Glucose, UA NEGATIVE NEGATIVE mg/dL   Hgb urine dipstick NEGATIVE NEGATIVE   Bilirubin Urine NEGATIVE NEGATIVE   Ketones, ur NEGATIVE NEGATIVE mg/dL   Protein, ur NEGATIVE NEGATIVE mg/dL   Nitrite NEGATIVE NEGATIVE   Leukocytes, UA NEGATIVE NEGATIVE    Comment: Performed at [place] [address], [place], [place] [address]  Influenza panel by PCR (type A & B)     Status: None   Collection Time: 02/03/18  4:15 AM  Result Value Ref Range   Influenza A By PCR NEGATIVE  NEGATIVE   Influenza B By PCR NEGATIVE NEGATIVE    Comment: (NOTE) The Xpert Xpress Flu assay is intended as an aid in the diagnosis of  influenza and should not be used as a sole basis for treatment.  This  assay is FDA approved for nasopharyngeal swab specimens only. Nasal  washings and aspirates are unacceptable for Xpert Xpress Flu testing. Performed at [facility], [place] [address], [place], [place] [address]    Dg Chest Port 1 View  Result Date: 02/03/2018 CLINICAL DATA:  Acute onset of drowsiness. Suicidal ideation. Drug  overdose. EXAM: PORTABLE CHEST 1 VIEW COMPARISON:  Chest radiograph performed 01/06/2018 FINDINGS: The lungs are well-aerated. Vascular congestion is noted. There is no evidence of focal opacification, pleural effusion or pneumothorax. The cardiomediastinal silhouette is within normal limits. No acute osseous abnormalities are seen. IMPRESSION: Vascular congestion.  Lungs remain grossly clear. Electronically Signed   By: Garald Balding M.D.   On: 02/03/2018 02:45    Pending Labs Unresulted Labs (From admission, onward)   Start     Ordered   02/03/18 0216  Blood Culture (routine x 2)  BLOOD CULTURE X 2,   STAT     02/03/18 0216   02/03/18 0206  cbc  Once,   STAT     02/03/18 0206   Signed and Held  Creatinine, serum  (enoxaparin (LOVENOX)    CrCl >/= 30 ml/min)  Weekly,   R    Comments:  while on enoxaparin therapy    Signed and Held   Signed and Held  Lithium level  Add-on,   R     Signed and Held   Signed and Held  Comprehensive metabolic panel  Tomorrow morning,   R     Signed and Held   Signed and Held  CBC  Tomorrow morning,   R     Signed and Held   Signed and Held  Troponin I  Now then every 6 hours,   R     Signed and Held      Vitals/Pain Today's Vitals   02/03/18 0521 02/03/18 0530 02/03/18 0555 02/03/18 0600  BP: (!) 110/52 115/67 125/73 (!) 102/58  Pulse: 98 91 94 90  Resp: _0 Temp:      TempSrc:      SpO2:  95% 97% 96% 95%  Weight:      Height:      PainSc:        Isolation Precautions Droplet precaution  Medications Medications  naloxone (NARCAN) injection 0.4 mg (has no administration in time range)  sodium chloride 0.9 % bolus 1,000 mL (0 mLs Intravenous Stopped 02/03/18 0329)    And  sodium chloride 0.9 % bolus 1,000 mL (0 mLs Intravenous Stopped 02/03/18 0440)    And  sodium chloride 0.9 % bolus 250 mL (0 mLs Intravenous Stopped 02/03/18 0611)  piperacillin-tazobactam (ZOSYN) IVPB 3.375 g (0 g Intravenous Stopped 02/03/18 0329)  vancomycin (VANCOCIN) 1,500 mg in sodium chloride 0.9 % 500 mL IVPB (0 mg Intravenous Stopped 02/03/18 0549)  acetaminophen (TYLENOL) suppository 650 mg (650 mg Rectal Given 02/03/18 0329)    Mobility walks

## 2018-02-03 NOTE — Progress Notes (Signed)
Patient seen by Dr. Selena BattenKim this morning. Patient is a 22 year old male with past medical history of ADHD, bipolar disorder, schizophrenia, polysubstance abuse including IV drugs who was brought by EMS to the emergency department with drug overdose of heroin and possible suicidal ideation. Patient was found to be febrile on presentation.  Sepsis suspected.  Started on broad-spectrum antibiotics.  Culture sent. UDS came out to be positive for opiates, cocaine, and amphetamine. Psychiatry also evaluated the patient this morning. Started on clonidine detox protocol. Lithium level found to be low.  He started on Lithobid. Patient is on 1: 1 sitter for safety.  Psychiatry recommended for inpatient psych admission after medical clearance.  Recommended to check lithium level in 72 hours. At the meantime, echocardiogram has been ordered to rule out endocarditis because patient presented with fever. Patient seen and examined the bedside this morning.  He is hemodynamically stable but continues to be febrile. He is alert and oriented .Does not want to communicate much . We will continue the current treatment.  We will continue to monitor the patient.
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
## 2018-02-03 NOTE — Progress Notes (Signed)
A consult was received from an ED physician for zosyn and vancomycin per pharmacy dosing.  The patient's profile has been reviewed for ht/wt/allergies/indication/available labs.   A one time order has been placed for Zosyn 3.375 gm and Vancomycin 1500 mg.  Further antibiotics/pharmacy consults should be ordered by admitting physician if indicated.                       Thank you, Lorenza EvangelistGreen, Meriah Shands R 02/03/2018  2:23 AM
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
## 2018-02-03 NOTE — Progress Notes (Signed)
Pharmacy Antibiotic Note  Thomas [name] is a 22 y.o. male admitted on 02/03/2018 with overdose; noted to have sepsis. Pharmacy has been consulted for vancomycin and Zosyn dosing.  Plan:  Vancomycin 1250 mg IV q12 hr (est AUC 499 based on SCr 1)  Measure vancomycin AUC at steady state as indicated  Zosyn 3.375 g IV given once over 30 minutes, then every 8 hrs by 4-hr infusion   Height: 5\' 9"  (175.3 cm) Weight: 165 lb (74.8 kg) IBW/kg (Calculated) : 70.7  Temp (24hrs), Avg:101.2 F (38.4 C), Min:100.9 F (38.3 C), Max:101.5 F (38.6 C)  Recent Labs  Lab 01/27/18 1413 01/29/18 1810 02/03/18 0225 02/03/18 0238  WBC 9.6 8.8 10.3  --   CREATININE 0.84 0.94 0.97  --   LATICACIDVEN  --   --   --  1.27    Estimated Creatinine Clearance: 122.5 mL/min (by C-G formula based on SCr of 0.97 mg/dL).    No Known Allergies  Antimicrobials this admission: 4/21 zosyn >>  4/21 vancomycin >>   Dose adjustments this admission: n/a  Microbiology results: 4/21 BCx: sent   Thank you for allowing pharmacy to be a part of this patient's care.  Bernadene Personrew Kanyla Omeara, PharmD, BCPS [phone] 02/03/2018, 6:22 AM

## 2018-02-04 ENCOUNTER — Other Ambulatory Visit (HOSPITAL_COMMUNITY): Payer: Self-pay

## 2018-02-04 ENCOUNTER — Inpatient Hospital Stay (HOSPITAL_COMMUNITY): Payer: Medicaid Other

## 2018-02-04 DIAGNOSIS — R502 Drug induced fever: Secondary | ICD-10-CM

## 2018-02-04 DIAGNOSIS — Z818 Family history of other mental and behavioral disorders: Secondary | ICD-10-CM

## 2018-02-04 DIAGNOSIS — F17210 Nicotine dependence, cigarettes, uncomplicated: Secondary | ICD-10-CM

## 2018-02-04 DIAGNOSIS — T401X1A Poisoning by heroin, accidental (unintentional), initial encounter: Secondary | ICD-10-CM

## 2018-02-04 DIAGNOSIS — R4587 Impulsiveness: Secondary | ICD-10-CM

## 2018-02-04 DIAGNOSIS — F1910 Other psychoactive substance abuse, uncomplicated: Secondary | ICD-10-CM

## 2018-02-04 DIAGNOSIS — T1491XA Suicide attempt, initial encounter: Secondary | ICD-10-CM

## 2018-02-04 DIAGNOSIS — T401X1S Poisoning by heroin, accidental (unintentional), sequela: Secondary | ICD-10-CM

## 2018-02-04 DIAGNOSIS — F1010 Alcohol abuse, uncomplicated: Secondary | ICD-10-CM

## 2018-02-04 LAB — COMPREHENSIVE METABOLIC PANEL
ALBUMIN: 2.9 g/dL — AB (ref 3.5–5.0)
ALT: 35 U/L (ref 17–63)
ANION GAP: 9 (ref 5–15)
AST: 43 U/L — AB (ref 15–41)
Alkaline Phosphatase: 52 U/L (ref 38–126)
BUN: 7 mg/dL (ref 6–20)
CHLORIDE: 107 mmol/L (ref 101–111)
CO2: 23 mmol/L (ref 22–32)
Calcium: 8.2 mg/dL — ABNORMAL LOW (ref 8.9–10.3)
Creatinine, Ser: 0.88 mg/dL (ref 0.61–1.24)
GFR calc Af Amer: >60 mL/min (ref >60)
GFR calc non Af Amer: >60 mL/min (ref >60)
GLUCOSE: 141 mg/dL — AB (ref 65–99)
POTASSIUM: 3.7 mmol/L (ref 3.5–5.1)
SODIUM: 139 mmol/L (ref 135–145)
TOTAL PROTEIN: 5.6 g/dL — AB (ref 6.5–8.1)
Total Bilirubin: 0.6 mg/dL (ref 0.3–1.2)

## 2018-02-04 LAB — CBC
HEMATOCRIT: 37 % — AB (ref 39.0–52.0)
HEMOGLOBIN: 12.8 g/dL — AB (ref 13.0–17.0)
MCH: 31 pg (ref 26.0–34.0)
MCHC: 34.6 g/dL (ref 30.0–36.0)
MCV: 89.6 fL (ref 78.0–100.0)
Platelets: 131 10*3/uL — ABNORMAL LOW (ref 150–400)
RBC: 4.13 MIL/uL — ABNORMAL LOW (ref 4.22–5.81)
RDW: 12.8 % (ref 11.5–15.5)
WBC: 3.7 10*3/uL — ABNORMAL LOW (ref 4.0–10.5)

## 2018-02-04 LAB — TSH: TSH: 0.249 u[IU]/mL — AB (ref 0.350–4.500)
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
## 2018-02-04 NOTE — Progress Notes (Signed)
Patient left with AMA at 1250. Notified MD.

## 2018-02-04 NOTE — Progress Notes (Addendum)
[name] PROGRESS NOTE  Stockton Nunley ZOX:[id number] DOB: 1996/05/03 DOA: 02/03/2018   PCP: [facility]     Assessment/Plan: Principal Problem:   Heroin overdose (HCC) Active Problems:   Polysubstance dependence (HCC)   Fever   22 year old male with past medical history of ADHD, bipolar disorder, schizophrenia, polysubstance abuse including IV drugs who was brought by EMS to the emergency department with drug overdose of heroin and possible suicidal ideation. Patient was found to be febrile on presentation.  Sepsis suspected.  Started on broad-spectrum antibiotics.  Culture sent. UDS came out to be positive for opiates, cocaine, and amphetamine.Psychiatry also evaluated the patient  and recommended inpatient psyche admission.  Assessment and plan  Sepsis, (Fever, tachycardia, hypotension) Blood culture x2, NGSF  Vanco iv , zosyn iv pharmacy to dose CXR , UA negative  Suspected to have endocarditis, echo pending  Influenza negative  Tachycardia in the setting of fever/polysubstance abuse continue Tele, UDS positive for amphetamines opiates and cocaine Troponin negative 3 Check TSH Check cardiac echoto rule out endocarditis    Heroin overdose Suicidal ideation 1:1 direct obs for safety Patient has been seen by psychiatry Started on clonidine protocol  Depression, Bipolar Lithium level less than 0.06 Cont Lithium Cont buspar Cont wellbutrin Under involuntary commitment as was trying to leave AMA   DVT prophylaxsis  lovenox   Code Status:  Full code       Family Communication: Discussed in detail with the patient, all imaging results, lab results explained to the patient   Disposition Plan:  under IVC until cleared by psyche       Consultants:  Psyche   Procedures:  None   Antibiotics: Anti-infectives (From admission, onward)   Start     Dose/Rate Route Frequency Ordered Stop   02/03/18 2200  vancomycin  (VANCOCIN) 1,250 mg in sodium chloride 0.9 % 250 mL IVPB     1,250 mg 166.7 mL/hr over 90 Minutes Intravenous Every 12 hours 02/03/18 0637     02/03/18 1400  vancomycin (VANCOCIN) IVPB 750 mg/150 ml premix     750 mg 150 mL/hr over 60 Minutes Intravenous  Once 02/03/18 0637 02/03/18 1519   02/03/18 0800  piperacillin-tazobactam (ZOSYN) IVPB 3.375 g     3.375 g 12.5 mL/hr over 240 Minutes Intravenous Every 8 hours 02/03/18 0637     02/03/18 0230  piperacillin-tazobactam (ZOSYN) IVPB 3.375 g     3.375 g 100 mL/hr over 30 Minutes Intravenous  Once 02/03/18 0216 02/03/18 0329   02/03/18 0230  vancomycin (VANCOCIN) IVPB 1000 mg/200 mL premix  Status:  Discontinued     1,000 mg 200 mL/hr over 60 Minutes Intravenous  Once 02/03/18 0216 02/03/18 0220   02/03/18 0230  vancomycin (VANCOCIN) 1,500 mg in sodium chloride 0.9 % 500 mL IVPB     1,500 mg 250 mL/hr over 120 Minutes Intravenous  Once 02/03/18 0220 02/03/18 0549         HPI/Subjective: Patient states that he was not suicidal yesterday and just needed a place to sleep, according to the notes very manipulative and cannot be trusted He had a fever of about 101.3 in the last 24 hours and therefore is being placed on involuntary commitment, denies any focal symptoms  Objective: Vitals:   02/03/18 2047 02/03/18 2146 02/04/18 0422 02/04/18 0500  BP: (!) 102/51  101/80   Pulse: 99  90   Resp: 16  16   Temp: (!) 101.3 F (38.5  C) 99.5 F (37.5 C) 98.5 F (36.9 C)   TempSrc: Oral Axillary Oral   SpO2: 96%  98%   Weight:    68.1 kg (150 lb 2.1 oz)  Height:        Intake/Output Summary (Last 24 hours) at 02/04/2018 0901 Last data filed at 02/04/2018 0600 Gross per 24 hour  Intake 1880 ml  Output 3125 ml  Net -1245 ml    Exam:  Examination:  General exam: Appears calm and comfortable  Respiratory system: Clear to auscultation. Respiratory effort normal. Cardiovascular system: S1 & S2 heard, RRR. No JVD, murmurs, rubs, gallops  or clicks. No pedal edema. Gastrointestinal system: Abdomen is nondistended, soft and nontender. No organomegaly or masses felt. Normal bowel sounds heard. Central nervous system: Alert and oriented. No focal neurological deficits. Extremities: Symmetric 5 x 5 power. Skin: No rashes, lesions or ulcers Psychiatry: Judgement and insight appear normal. Mood & affect appropriate.     Data Reviewed: I have personally reviewed following labs and imaging studies  Micro Results No results found for this or any previous visit (from the past 240 hour(s)).  Radiology Reports Dg Chest 2 View  Result Date: 01/06/2018 CLINICAL DATA:  Redness and swelling EXAM: CHEST - 2 VIEW COMPARISON:  December 13, 2017 FINDINGS: The heart size and mediastinal contours are within normal limits. Both lungs are clear. The visualized skeletal structures are unremarkable. IMPRESSION: No active cardiopulmonary disease. Electronically Signed   By: Gerome Samavid  Williams [name] M.D   On: 01/06/2018 10:22   Dg Elbow Complete Right  Result Date: 01/06/2018 CLINICAL DATA:  Soft tissue swelling and redness. EXAM: RIGHT ELBOW - COMPLETE 3+ VIEW COMPARISON:  None. FINDINGS: Soft tissue edema. No bony abnormality. No fracture or bony erosion. IMPRESSION: No bony abnormality. Electronically Signed   By: Gerome Samavid  Williams [name] M.D   On: 01/06/2018 10:21   Ct Maxillofacial W Contrast  Result Date: 01/29/2018 CLINICAL DATA:  Nasal methamphetamine use.  Left facial pain. EXAM: CT MAXILLOFACIAL WITH CONTRAST TECHNIQUE: Multidetector CT imaging of the maxillofacial structures was performed with intravenous contrast. Multiplanar CT image reconstructions were also generated. CONTRAST:  75mL ISOVUE-300 IOPAMIDOL (ISOVUE-300) INJECTION 61% COMPARISON:  04/14/2017 maxillofacial CT FINDINGS: Osseous: --Complex facial fracture types: No LeFort, zygomaticomaxillary complex or nasoorbitoethmoidal fracture. --Simple fracture types: None. --Mandible, hard palate  and teeth: No acute abnormality. Orbits: The globes appear intact. Normal appearance of the intra- and extraconal fat. Symmetric extraocular muscles. Sinuses: There is swelling and soft tissue thickening of the left nares with a small low-attenuation collection that measures 4 x 5 mm. Soft tissues: Normal visualized extracranial soft tissues. Limited intracranial: Normal. IMPRESSION: Inflammation of the left nares with small abscess measuring 4 x 5 mm. Electronically Signed   By: Deatra RobinsonKevin  Herman M.D.   On: 01/29/2018 21:39   Dg Chest Port 1 View  Result Date: 02/03/2018 CLINICAL DATA:  Acute onset of drowsiness. Suicidal ideation. Drug overdose. EXAM: PORTABLE CHEST 1 VIEW COMPARISON:  Chest radiograph performed 01/06/2018 FINDINGS: The lungs are well-aerated. Vascular congestion is noted. There is no evidence of focal opacification, pleural effusion or pneumothorax. The cardiomediastinal silhouette is within normal limits. No acute osseous abnormalities are seen. IMPRESSION: Vascular congestion.  Lungs remain grossly clear. Electronically Signed   By: Roanna RaiderJeffery  Chang M.D.   On: 02/03/2018 02:45   Dg Humerus Right  Result Date: 01/06/2018 CLINICAL DATA:  Red swollen upper extremity. Wound antecubital region. EXAM: RIGHT HUMERUS - 2+ VIEW COMPARISON:  None. FINDINGS: Soft tissue  edema identified, consistent with history. No fracture or dislocation. No bony lesion or evidence of osteomyelitis. IMPRESSION: No bony abnormality identified. Electronically Signed   By: Gerome Sam [name] M.D   On: 01/06/2018 10:20   Ct Extrem Up Entire Arm R W/cm  Result Date: 01/06/2018 CLINICAL DATA:  Right upper extremity pain and swelling for 3 days. Intravenous drug abuse. Soft tissue infection. EXAM: CT OF THE UPPER RIGHT EXTREMITY WITH CONTRAST TECHNIQUE: Multidetector CT imaging of the upper right extremity was performed according to the standard protocol following intravenous contrast administration. COMPARISON:   Radiographs same date. CONTRAST:  ISOVUE-300 IOPAMIDOL (ISOVUE-300) INJECTION 61% FINDINGS: Bones/Joint/Cartilage The entire right upper extremity was imaged, extending from the shoulder through the fingers. No osseous abnormalities are identified. Specifically, no evidence bone destruction, acute fracture or dislocation. No significant effusions are seen at the shoulder, elbow or wrist. Ligaments Suboptimally assessed by CT. Muscles and Tendons There are multiple air bubbles anteriorly within the soft tissues of the upper arm and proximal forearm. Some of these air bubbles are located deep to the biceps and brachialis musculature. There is mild intermuscular edema without drainable fluid collection. Soft tissues As above, soft tissue emphysema within the anterior upper arm and proximal forearm. There is diffuse soft tissue stranding in the subcutaneous fat consistent with cellulitis. No drainable abscess. No large vessel occlusion. Possible superficial thrombophlebitis involving the cephalic vein near the antecubital fossa. Prominent lymph nodes in the right axilla, likely reactive. IMPRESSION: 1. Findings are consistent with cellulitis and probable myofasciitis involving the anterior upper arm and proximal forearm. Associated scattered air bubbles, but no drainable fluid collection. 2. Possible superficial thrombophlebitis involving the cephalic vein. 3. No evidence of osteomyelitis or septic joint. Electronically Signed   By: Carey Bullocks M.D.   On: 01/06/2018 11:43     CBC Recent Labs  Lab 01/29/18 1810 02/03/18 0225 02/04/18 0618  WBC 8.8 10.3 3.7*  HGB 14.4 13.7 12.8*  HCT 40.9 38.9* 37.0*  PLT 205 188 131*  MCV 88.9 89.0 89.6  MCH 31.3 31.4 31.0  MCHC 35.2 35.2 34.6  RDW 13.1 12.6 12.8  LYMPHSABS  --  0.5*  --   MONOABS  --  0.5  --   EOSABS  --  0.0  --   BASOSABS  --  0.0  --     Chemistries  Recent Labs  Lab 01/29/18 1810 02/03/18 0225 02/04/18 0618  NA 135 134* 139   K 3.9 3.7 3.7  CL 100* 98* 107  CO2 24 27 23   GLUCOSE 87 96 141*  BUN 11 13 7   CREATININE 0.94 0.97 0.88  CALCIUM 9.4 9.2 8.2*  AST 20 20 43*  ALT 23 25 35  ALKPHOS 61 58 52  BILITOT 0.9 0.7 0.6   ------------------------------------------------------------------------------------------------------------------ estimated creatinine clearance is 130.1 mL/min (by C-G formula based on SCr of 0.88 mg/dL). ------------------------------------------------------------------------------------------------------------------ No results for input(s): HGBA1C in the last 72 hours. ------------------------------------------------------------------------------------------------------------------ No results for input(s): CHOL, HDL, LDLCALC, TRIG, CHOLHDL, LDLDIRECT in the last 72 hours. ------------------------------------------------------------------------------------------------------------------ No results for input(s): TSH, T4TOTAL, T3FREE, THYROIDAB in the last 72 hours.  Invalid input(s): FREET3 ------------------------------------------------------------------------------------------------------------------ No results for input(s): VITAMINB12, FOLATE, FERRITIN, TIBC, IRON, RETICCTPCT in the last 72 hours.  Coagulation profile No results for input(s): INR, PROTIME in the last 168 hours.  No results for input(s): DDIMER in the last 72 hours.  Cardiac Enzymes Recent Labs  Lab 02/03/18 1011 02/03/18 1551 02/03/18 2145  TROPONINI <0.03 <0.03 <0.03   ------------------------------------------------------------------------------------------------------------------  Invalid input(s): POCBNP   CBG: Recent Labs  Lab 02/03/18 0302  GLUCAP 94       Studies: Dg Chest Port 1 View  Result Date: 02/03/2018 CLINICAL DATA:  Acute onset of drowsiness. Suicidal ideation. Drug overdose. EXAM: PORTABLE CHEST 1 VIEW COMPARISON:  Chest radiograph performed 01/06/2018 FINDINGS: The lungs are  well-aerated. Vascular congestion is noted. There is no evidence of focal opacification, pleural effusion or pneumothorax. The cardiomediastinal silhouette is within normal limits. No acute osseous abnormalities are seen. IMPRESSION: Vascular congestion.  Lungs remain grossly clear. Electronically Signed   By: Roanna Raider M.D.   On: 02/03/2018 02:45      Lab Results  Component Value Date   HGBA1C 4.8 07/04/2016   HGBA1C 5.0 07/24/2014   HGBA1C 5.2 12/05/2012   Lab Results  Component Value Date   LDLCALC 57 07/04/2016   CREATININE 0.88 02/04/2018       Scheduled Meds: . buPROPion  300 mg Oral Daily  . busPIRone  5 mg Oral TID  . cloNIDine  0.1 mg Oral QID   Followed by  . [START ON 02/05/2018] cloNIDine  0.1 mg Oral BH-qamhs   Followed by  . [START ON 02/07/2018] cloNIDine  0.1 mg Oral QAC breakfast  . enoxaparin (LOVENOX) injection  40 mg Subcutaneous Q24H  . lithium carbonate  600 mg Oral BID   Continuous Infusions: . sodium chloride Stopped (02/04/18 0823)  . piperacillin-tazobactam (ZOSYN)  IV 3.375 g (02/04/18 0700)  . vancomycin Stopped (02/04/18 0015)     LOS: 1 day    Time spent: >30 MINS    Richarda Overlie  [facility] Pager [phone]. If 7PM-7AM, please contact night-coverage at [email or web address], password [facility] 02/04/2018, 9:01 AM  LOS: 1 day

## 2018-02-04 NOTE — Progress Notes (Signed)
Per Dr. Sharma CovertNorman, patient cleared by psych.   LCSW signing off. No LCSW needs at this time.    Beulah GandyBernette Jahmil Macleod, LSCW [facility] [phone]

## 2018-02-04 NOTE — Discharge Summary (Signed)
Physician Discharge Summary  Thomas [name] MRN: [id number] DOB/AGE: November 23, 1995 22 y.o.  PCP: [facility] date: 02/03/2018 Discharge date: 02/04/2018  Discharge Diagnoses:    Principal Problem:   [name] Active Problems:   [name]   Fever    Follow-up recommendations   Patient left AGAINST MEDICAL ADVICE         Discharge Condition: patient left AGAINST MEDICAL ADVICE  Discharge Instructions Get Medicines reviewed and adjusted: Please take all your medications with you for your next visit with your Primary MD  Please request your Primary MD to go over all hospital tests and procedure/radiological results at the follow up, please ask your Primary MD to get all Hospital records sent to his/her office.  If you experience worsening of your admission symptoms, develop shortness of breath, life threatening emergency, suicidal or homicidal thoughts you must seek medical attention immediately by calling 911 or calling your MD immediately if symptoms less severe.  You must read complete instructions/literature along with all the possible adverse reactions/side effects for all the Medicines you take and that have been prescribed to you. Take any new Medicines after you have completely understood and accpet all the possible adverse reactions/side effects.   Do not drive when taking Pain medications.   Do not take more than prescribed Pain, Sleep and Anxiety Medications  Special Instructions: If you have smoked or chewed Tobacco in the last 2 yrs please stop smoking, stop any regular Alcohol and or any Recreational drug use.  Wear Seat belts while driving.  Please note  You were cared for by a hospitalist during your hospital stay. Once you are discharged, your primary care physician will handle any further medical issues. Please note that NO REFILLS for any discharge medications will be authorized once you are  discharged, as it is imperative that you return to your primary care physician (or establish a relationship with a primary care physician if you do not have one) for your aftercare needs so that they can reassess your need for medications and monitor your lab values.     No Known Allergies    Disposition:    Consults: * psychiatry    Significant Diagnostic Studies:  Dg Chest 2 View  Result Date: 01/06/2018 CLINICAL DATA:  Redness and swelling EXAM: CHEST - 2 VIEW COMPARISON:  December 13, 2017 FINDINGS: The heart size and mediastinal contours are within normal limits. Both lungs are clear. The visualized skeletal structures are unremarkable. IMPRESSION: No active cardiopulmonary disease. Electronically Signed   By: Dorise Bullion [name] M.D   On: 01/06/2018 10:22   Dg Elbow Complete Right  Result Date: 01/06/2018 CLINICAL DATA:  Soft tissue swelling and redness. EXAM: RIGHT ELBOW - COMPLETE 3+ VIEW COMPARISON:  None. FINDINGS: Soft tissue edema. No bony abnormality. No fracture or bony erosion. IMPRESSION: No bony abnormality. Electronically Signed   By: Dorise Bullion [name] M.D   On: 01/06/2018 10:21   Ct Maxillofacial W Contrast  Result Date: 01/29/2018 CLINICAL DATA:  Nasal methamphetamine use.  Left facial pain. EXAM: CT MAXILLOFACIAL WITH CONTRAST TECHNIQUE: Multidetector CT imaging of the maxillofacial structures was performed with intravenous contrast. Multiplanar CT image reconstructions were also generated. CONTRAST:  71m ISOVUE-300 IOPAMIDOL (ISOVUE-300) INJECTION 61% COMPARISON:  04/14/2017 maxillofacial CT FINDINGS: Osseous: --Complex facial fracture types: No Le[place], zygomaticomaxillary complex or nasoorbitoethmoidal fracture. --Simple fracture types: None. --Mandible, hard palate and teeth: No acute abnormality. Orbits: The globes appear intact. Normal appearance of  the intra- and extraconal fat. Symmetric extraocular muscles. Sinuses: There is swelling and soft tissue  thickening of the left nares with a small low-attenuation collection that measures 4 x 5 mm. Soft tissues: Normal visualized extracranial soft tissues. Limited intracranial: Normal. IMPRESSION: Inflammation of the left nares with small abscess measuring 4 x 5 mm. Electronically Signed   By: Ulyses Jarred M.D.   On: 01/29/2018 21:39   Dg Chest Port 1 View  Result Date: 02/04/2018 CLINICAL DATA:  Fever. EXAM: PORTABLE CHEST 1 VIEW COMPARISON:  02/03/2018 FINDINGS: The heart size and mediastinal contours are within normal limits. Both lungs are clear except for minimal linear atelectasis at the right lung base. The visualized skeletal structures are unremarkable. IMPRESSION: Minimal atelectasis at the right lung base.  Otherwise, normal exam. Electronically Signed   By: Lorriane Shire M.D.   On: 02/04/2018 09:23   Dg Chest Port 1 View  Result Date: 02/03/2018 CLINICAL DATA:  Acute onset of drowsiness. Suicidal ideation. Drug overdose. EXAM: PORTABLE CHEST 1 VIEW COMPARISON:  Chest radiograph performed 01/06/2018 FINDINGS: The lungs are well-aerated. Vascular congestion is noted. There is no evidence of focal opacification, pleural effusion or pneumothorax. The cardiomediastinal silhouette is within normal limits. No acute osseous abnormalities are seen. IMPRESSION: Vascular congestion.  Lungs remain grossly clear. Electronically Signed   By: Garald Balding M.D.   On: 02/03/2018 02:45   Dg Humerus Right  Result Date: 01/06/2018 CLINICAL DATA:  Red swollen upper extremity. Wound antecubital region. EXAM: RIGHT HUMERUS - 2+ VIEW COMPARISON:  None. FINDINGS: Soft tissue edema identified, consistent with history. No fracture or dislocation. No bony lesion or evidence of osteomyelitis. IMPRESSION: No bony abnormality identified. Electronically Signed   By: Dorise Bullion [name] M.D   On: 01/06/2018 10:20   Ct Extrem Up Entire Arm R W/cm  Result Date: 01/06/2018 CLINICAL DATA:  Right upper extremity pain and  swelling for 3 days. Intravenous drug abuse. Soft tissue infection. EXAM: CT OF THE UPPER RIGHT EXTREMITY WITH CONTRAST TECHNIQUE: Multidetector CT imaging of the upper right extremity was performed according to the standard protocol following intravenous contrast administration. COMPARISON:  Radiographs same date. CONTRAST:  122m ISOVUE-300 IOPAMIDOL (ISOVUE-300) INJECTION 61% FINDINGS: Bones/Joint/Cartilage The entire right upper extremity was imaged, extending from the shoulder through the fingers. No osseous abnormalities are identified. Specifically, no evidence bone destruction, acute fracture or dislocation. No significant effusions are seen at the shoulder, elbow or wrist. Ligaments Suboptimally assessed by CT. Muscles and Tendons There are multiple air bubbles anteriorly within the soft tissues of the upper arm and proximal forearm. Some of these air bubbles are located deep to the biceps and brachialis musculature. There is mild intermuscular edema without drainable fluid collection. Soft tissues As above, soft tissue emphysema within the anterior upper arm and proximal forearm. There is diffuse soft tissue stranding in the subcutaneous fat consistent with cellulitis. No drainable abscess. No large vessel occlusion. Possible superficial thrombophlebitis involving the cephalic vein near the antecubital fossa. Prominent lymph nodes in the right axilla, likely reactive. IMPRESSION: 1. Findings are consistent with cellulitis and probable myofasciitis involving the anterior upper arm and proximal forearm. Associated scattered air bubbles, but no drainable fluid collection. 2. Possible superficial thrombophlebitis involving the cephalic vein. 3. No evidence of osteomyelitis or septic joint. Electronically Signed   By: WRichardean SaleM.D.   On: 01/06/2018 11:43        Filed Weights   02/03/18 0203 02/03/18 1331 02/04/18 0500  Weight: 74.8  kg (165 lb) 72.1 kg (159 lb) 68.1 kg (150 lb 2.1 oz)      Microbiology: No results found for this or any previous visit (from the past 240 hour(s)).     Blood Culture    Component Value Date/Time   SDES  01/06/2018 0917    BLOOD LEFT HAND Performed at [facility], [place] [address], [place], [place] [address]    SPECREQUEST  01/06/2018 [phone]    BOTTLES DRAWN AEROBIC AND ANAEROBIC Blood Culture adequate volume Performed at [place] [address], [place], [place] [address]    CULT  01/06/2018 [id number]    NO GROWTH 5 DAYS Performed at [facility], [place] [address] [place], [place] [address]    REPTSTATUS 01/11/2018 FINAL 01/06/2018 4163      Labs: Results for orders placed or performed during the hospital encounter of 02/03/18 (from the past 48 hour(s))  Comprehensive metabolic panel     Status: Abnormal   Collection Time: 02/03/18  2:25 AM  Result Value Ref Range   Sodium 134 (L) 135 - 145 mmol/L   Potassium 3.7 3.5 - 5.1 mmol/L   Chloride 98 (L) 101 - 111 mmol/L   CO2 27 22 - 32 mmol/L   Glucose, Bld 96 65 - 99 mg/dL   BUN 13 6 - 20 mg/dL   Creatinine, Ser 0.97 0.61 - 1.24 mg/dL   Calcium 9.2 8.9 - 10.3 mg/dL   Total Protein 7.5 6.5 - 8.1 g/dL   Albumin 4.1 3.5 - 5.0 g/dL   AST 20 15 - 41 U/L   ALT 25 17 - 63 U/L   Alkaline Phosphatase 58 38 - 126 U/L   Total Bilirubin 0.7 0.3 - 1.2 mg/dL   GFR calc non Af Amer >60 >60 mL/min   GFR calc Af Amer >60 >60 mL/min    Comment: (NOTE) The eGFR has been calculated using the CKD EPI equation. This calculation has not been validated in all clinical situations. eGFR's persistently <60 mL/min signify possible Chronic Kidney Disease.    Anion gap 9 5 - 15    Comment: Performed at [facility], [place] [address], [place], [place] [address]  Ethanol     Status: None   Collection Time: 02/03/18  2:25 AM  Result Value Ref Range   Alcohol, Ethyl (B) <10 <10 mg/dL    Comment:        LOWEST DETECTABLE LIMIT FOR SERUM  ALCOHOL IS 10 mg/dL FOR MEDICAL PURPOSES ONLY Performed at [facility], [place] [address], [place], [place] [address]   Salicylate level     Status: None   Collection Time: 02/03/18  2:25 AM  Result Value Ref Range   Salicylate Lvl <2.1 2.8 - 30.0 mg/dL    Comment: Performed at [facility], [place] [address] [place], [place] [address]  Acetaminophen level     Status: Abnormal   Collection Time: 02/03/18  2:25 AM  Result Value Ref Range   Acetaminophen (Tylenol), Serum <10 (L) 10 - 30 ug/mL    Comment:        THERAPEUTIC CONCENTRATIONS VARY SIGNIFICANTLY. A RANGE OF 10-30 ug/mL MAY BE AN EFFECTIVE CONCENTRATION FOR MANY PATIENTS. HOWEVER, SOME ARE BEST TREATED AT CONCENTRATIONS OUTSIDE THIS RANGE. ACETAMINOPHEN CONCENTRATIONS >150 ug/mL AT 4 HOURS AFTER INGESTION AND >50 ug/mL AT 12 HOURS AFTER INGESTION ARE OFTEN ASSOCIATED WITH TOXIC REACTIONS. Performed at [facility], [place]  [address] [place], [place] [address]   CBC WITH DIFFERENTIAL     Status: Abnormal   Collection Time: 02/03/18  2:25 AM  Result Value Ref Range   WBC 10.3 4.0 - 10.5 K/uL   RBC 4.37 4.22 - 5.81 MIL/uL   Hemoglobin 13.7 13.0 - 17.0 g/dL   HCT 38.9 (L) 39.0 - 52.0 %   MCV 89.0 78.0 - 100.0 fL   MCH 31.4 26.0 - 34.0 pg   MCHC 35.2 30.0 - 36.0 g/dL   RDW 12.6 11.5 - 15.5 %   Platelets 188 150 - 400 K/uL   Neutrophils Relative % 90 %   Neutro Abs 9.3 (H) 1.7 - 7.7 K/uL   Lymphocytes Relative 5 %   Lymphs Abs 0.5 (L) 0.7 - 4.0 K/uL   Monocytes Relative 5 %   Monocytes Absolute 0.5 0.1 - 1.0 K/uL   Eosinophils Relative 0 %   Eosinophils Absolute 0.0 0.0 - 0.7 K/uL   Basophils Relative 0 %   Basophils Absolute 0.0 0.0 - 0.1 K/uL    Comment: Performed at [facility], [place] [address] [place], [place] [address]  I-Stat CG4 Lactic Acid, ED  (not at  [facility])     Status: None   Collection Time: 02/03/18  2:38 AM  Result Value Ref  Range   Lactic Acid, Venous 1.27 0.5 - 1.9 mmol/L  CBG monitoring, ED     Status: None   Collection Time: 02/03/18  3:02 AM  Result Value Ref Range   Glucose-Capillary 94 65 - 99 mg/dL   Comment 1 Notify RN    Comment 2 Document in Chart   Rapid urine drug screen (hospital performed)     Status: Abnormal   Collection Time: 02/03/18  4:00 AM  Result Value Ref Range   Opiates POSITIVE (A) NONE DETECTED   Cocaine POSITIVE (A) NONE DETECTED   Benzodiazepines NONE DETECTED NONE DETECTED   Amphetamines POSITIVE (A) NONE DETECTED   Tetrahydrocannabinol NONE DETECTED NONE DETECTED   Barbiturates NONE DETECTED NONE DETECTED    Comment: (NOTE) DRUG SCREEN FOR MEDICAL PURPOSES ONLY.  IF CONFIRMATION IS NEEDED FOR ANY PURPOSE, NOTIFY LAB WITHIN 5 DAYS. LOWEST DETECTABLE LIMITS FOR URINE DRUG SCREEN Drug Class                     Cutoff (ng/mL) Amphetamine and metabolites    1000 Barbiturate and metabolites    200 Benzodiazepine                 448 Tricyclics and metabolites     300 Opiates and metabolites        300 Cocaine and metabolites        300 THC                            50 Performed at [facility], [place] [address] [place], [place] [address]   Urinalysis, Routine w reflex microscopic     Status: Abnormal   Collection Time: 02/03/18  4:00 AM  Result Value Ref Range   Color, Urine STRAW (A) YELLOW   APPearance CLEAR CLEAR   Specific Gravity, Urine 1.005 1.005 - 1.030   pH 5.0 5.0 - 8.0   Glucose, UA NEGATIVE NEGATIVE mg/dL   Hgb urine dipstick NEGATIVE NEGATIVE   Bilirubin Urine NEGATIVE NEGATIVE   Ketones, ur NEGATIVE NEGATIVE mg/dL   Protein, ur NEGATIVE NEGATIVE mg/dL  Nitrite NEGATIVE NEGATIVE   Leukocytes, UA NEGATIVE NEGATIVE    Comment: Performed at [place] [address], [place], [place] [address]  Influenza panel by PCR (type A & B)     Status: None   Collection Time: 02/03/18  4:15 AM  Result Value Ref Range    Influenza A By PCR NEGATIVE NEGATIVE   Influenza B By PCR NEGATIVE NEGATIVE    Comment: (NOTE) The Xpert Xpress Flu assay is intended as an aid in the diagnosis of  influenza and should not be used as a sole basis for treatment.  This  assay is FDA approved for nasopharyngeal swab specimens only. Nasal  washings and aspirates are unacceptable for Xpert Xpress Flu testing. Performed at [facility], [place] [address], [place], [place] [address]   Lithium level     Status: Abnormal   Collection Time: 02/03/18 10:11 AM  Result Value Ref Range   Lithium Lvl <0.06 (L) 0.60 - 1.20 mmol/L    Comment: Performed at [facility], [place] [address], [place], [place] [address]  Troponin I     Status: None   Collection Time: 02/03/18 10:11 AM  Result Value Ref Range   Troponin I <0.03 <0.03 ng/mL    Comment: Performed at [facility], [place] [address] [place], [place] [address]  Troponin I     Status: None   Collection Time: 02/03/18  3:51 PM  Result Value Ref Range   Troponin I <0.03 <0.03 ng/mL    Comment: Performed at [facility], [place] [address], [place], [place] [address]  Troponin I     Status: None   Collection Time: 02/03/18  9:45 PM  Result Value Ref Range   Troponin I <0.03 <0.03 ng/mL    Comment: Performed at [facility], [place] [address], [place], [place] [address]  Comprehensive metabolic panel     Status: Abnormal   Collection Time: 02/04/18  6:18 AM  Result Value Ref Range   Sodium 139 135 - 145 mmol/L   Potassium 3.7 3.5 - 5.1 mmol/L   Chloride 107 101 - 111 mmol/L   CO2 23 22 - 32 mmol/L   Glucose, Bld 141 (H) 65 - 99 mg/dL   BUN 7 6 - 20 mg/dL   Creatinine, Ser 0.88 0.61 - 1.24 mg/dL   Calcium 8.2 (L) 8.9 - 10.3 mg/dL   Total Protein 5.6 (L) 6.5 - 8.1 g/dL   Albumin 2.9 (L) 3.5 - 5.0 g/dL   AST 43 (H) 15 - 41 U/L   ALT 35 17 - 63 U/L   Alkaline Phosphatase 52 38 - 126 U/L   Total  Bilirubin 0.6 0.3 - 1.2 mg/dL   GFR calc non Af Amer >60 >60 mL/min   GFR calc Af Amer >60 >60 mL/min    Comment: (NOTE) The eGFR has been calculated using the CKD EPI equation. This calculation has not been validated in all clinical situations. eGFR's persistently <60 mL/min signify possible Chronic Kidney Disease.    Anion gap 9 5 - 15    Comment: Performed at [facility], [place] [address], [place], [place] [address]  CBC     Status: Abnormal   Collection Time: 02/04/18  6:18 AM  Result Value Ref Range   WBC 3.7 (L) 4.0 - 10.5 K/uL   RBC 4.13 (L) 4.22 - 5.81 MIL/uL   Hemoglobin 12.8 (L) 13.0 - 17.0 g/dL  HCT 37.0 (L) 39.0 - 52.0 %   MCV 89.6 78.0 - 100.0 fL   MCH 31.0 26.0 - 34.0 pg   MCHC 34.6 30.0 - 36.0 g/dL   RDW 12.8 11.5 - 15.5 %   Platelets 131 (L) 150 - 400 K/uL    Comment: Performed at [facility], [place] [address], [place], [place] [address]  TSH     Status: Abnormal   Collection Time: 02/04/18  6:18 AM  Result Value Ref Range   TSH 0.249 (L) 0.350 - 4.500 uIU/mL    Comment: Performed by a 3rd Generation assay with a functional sensitivity of <=0.01 uIU/mL. Performed at [facility], [place] [address], [place], [place] [address]      Lipid Panel     Component Value Date/Time   CHOL 125 07/04/2016 0638   TRIG 144 07/04/2016 0638   HDL 39 (L) 07/04/2016 0638   CHOLHDL 3.2 07/04/2016 0638   VLDL 29 07/04/2016 0638   LDLCALC 57 07/04/2016 0638     Lab Results  Component Value Date   HGBA1C 4.8 07/04/2016   HGBA1C 5.0 07/24/2014   HGBA1C 5.2 12/05/2012     Lab Results  Component Value Date   LDLCALC 57 07/04/2016   CREATININE 0.88 02/04/2018     71 -year-old male with past medical history of ADHD, bipolar disorder, schizophrenia, polysubstance abuse including IV drugs who was brought by EMS to the emergency department with drug overdose of heroin and possible suicidal ideation. Patient was found  to be febrile on presentation. Sepsis suspected. Started on broad-spectrum antibiotics. Culture sent. UDS came out to be positive for opiates, cocaine, and amphetamine.Psychiatry alsoevaluatedthe patient  and recommended inpatient psyche admission.  Hospital course  Sepsis, (Fever, tachycardia, hypotension) Blood culture x2, NGSF  Vanco iv , zosyn iv pharmacy to dose CXR , UA negative  Suspected to have endocarditis, echo pending  Influenza negative  Tachycardia in the setting of fever/polysubstance abuse continue Tele, UDS positive for amphetamines opiates and cocaine Troponin negative 3 Check TSH Cardiac echo pending to rule out endocarditis    Heroin overdose Suicidal ideation 1:1 direct obs for safety Patient has been seen by psychiatry Started on clonidine protocol  Depression, Bipolar Lithium level less than 0.06 Cont Lithium Cont buspar Cont wellbutrin Patient cleared by psychiatry after being placed under IVC, to have capacity to make all medical decisions , not suicidal Patient decided to leave [place]       Discharge Exam  Blood pressure 101/80, pulse 90, temperature 98.5 F (36.9 C), temperature source Oral, resp. rate 16, height '5\' 9"'  (1.753 m), weight 68.1 kg (150 lb 2.1 oz), SpO2 98 %.   General exam: Appears calm and comfortable  Respiratory system: Clear to auscultation. Respiratory effort normal. Cardiovascular system: S1 & S2 heard, RRR. No JVD, murmurs, rubs, gallops or clicks. No pedal edema. Gastrointestinal system: Abdomen is nondistended, soft and nontender. No organomegaly or masses felt. Normal bowel sounds heard. Central nervous system: Alert and oriented. No focal neurological deficits. Extremities: Symmetric 5 x 5 power. Skin: No rashes, lesions or ulcers Psychiatry: Judgement and insight appear normal. Mood & affect appropriate.       SignedReyne Dumas 02/04/2018, 12:29 PM        Time spent >1  hour

## 2018-02-06 ENCOUNTER — Emergency Department (HOSPITAL_COMMUNITY)
Admission: EM | Admit: 2018-02-06 | Discharge: 2018-02-06 | Disposition: A | Discharge status: Home / Self Care | Payer: Medicaid Other | Attending: Emergency Medicine | Admitting: Emergency Medicine

## 2018-02-06 ENCOUNTER — Other Ambulatory Visit: Payer: Self-pay

## 2018-02-06 ENCOUNTER — Encounter (HOSPITAL_COMMUNITY): Payer: Self-pay | Admitting: *Deleted

## 2018-02-06 DIAGNOSIS — R45851 Suicidal ideations: Secondary | ICD-10-CM | POA: Diagnosis not present

## 2018-02-06 DIAGNOSIS — Z79899 Other long term (current) drug therapy: Secondary | ICD-10-CM | POA: Insufficient documentation

## 2018-02-06 DIAGNOSIS — R443 Hallucinations, unspecified: Secondary | ICD-10-CM

## 2018-02-06 DIAGNOSIS — F1120 Opioid dependence, uncomplicated: Secondary | ICD-10-CM | POA: Diagnosis not present

## 2018-02-06 DIAGNOSIS — F17210 Nicotine dependence, cigarettes, uncomplicated: Secondary | ICD-10-CM | POA: Diagnosis not present

## 2018-02-06 DIAGNOSIS — F3132 Bipolar disorder, current episode depressed, moderate: Secondary | ICD-10-CM | POA: Diagnosis not present

## 2018-02-06 DIAGNOSIS — F1910 Other psychoactive substance abuse, uncomplicated: Secondary | ICD-10-CM | POA: Insufficient documentation

## 2018-02-06 LAB — COMPREHENSIVE METABOLIC PANEL
ALK PHOS: 73 U/L (ref 38–126)
ALT: 47 U/L (ref 17–63)
ANION GAP: 11 (ref 5–15)
AST: 32 U/L (ref 15–41)
Albumin: 3.9 g/dL (ref 3.5–5.0)
BILIRUBIN TOTAL: 0.7 mg/dL (ref 0.3–1.2)
BUN: 7 mg/dL (ref 6–20)
CALCIUM: 9.2 mg/dL (ref 8.9–10.3)
CO2: 24 mmol/L (ref 22–32)
Chloride: 105 mmol/L (ref 101–111)
Creatinine, Ser: 0.95 mg/dL (ref 0.61–1.24)
GFR calc Af Amer: >60 mL/min (ref >60)
GFR calc non Af Amer: >60 mL/min (ref >60)
GLUCOSE: 97 mg/dL (ref 65–99)
POTASSIUM: 3.1 mmol/L — AB (ref 3.5–5.1)
Sodium: 140 mmol/L (ref 135–145)
TOTAL PROTEIN: 7.5 g/dL (ref 6.5–8.1)

## 2018-02-06 LAB — RAPID URINE DRUG SCREEN, HOSP PERFORMED
Amphetamines: NOT DETECTED
BARBITURATES: NOT DETECTED
BENZODIAZEPINES: NOT DETECTED
Cocaine: POSITIVE — AB
Opiates: POSITIVE — AB
Tetrahydrocannabinol: POSITIVE — AB

## 2018-02-06 LAB — CBC
HEMATOCRIT: 38.9 % — AB (ref 39.0–52.0)
Hemoglobin: 13.7 g/dL (ref 13.0–17.0)
MCH: 31 pg (ref 26.0–34.0)
MCHC: 35.2 g/dL (ref 30.0–36.0)
MCV: 88 fL (ref 78.0–100.0)
PLATELETS: 169 10*3/uL (ref 150–400)
RBC: 4.42 MIL/uL (ref 4.22–5.81)
RDW: 13 % (ref 11.5–15.5)
WBC: 7 10*3/uL (ref 4.0–10.5)

## 2018-02-06 LAB — ACETAMINOPHEN LEVEL

## 2018-02-06 LAB — ETHANOL: Alcohol, Ethyl (B): <10 mg/dL (ref <10)

## 2018-02-06 LAB — SALICYLATE LEVEL: Salicylate Lvl: <7 mg/dL (ref 2.8–30.0)

## 2018-02-06 MED ORDER — BUSPIRONE HCL 10 MG PO TABS
5.0000 mg | ORAL_TABLET | Freq: Three times a day (TID) | ORAL | Status: DC
  Administered 2018-02-06: 5 mg via ORAL
  Filled 2018-02-06: qty 1

## 2018-02-06 MED ORDER — LITHIUM CARBONATE ER 300 MG PO TBCR
600.0000 mg | EXTENDED_RELEASE_TABLET | Freq: Two times a day (BID) | ORAL | Status: DC
  Administered 2018-02-06: 600 mg via ORAL
  Filled 2018-02-06: qty 2

## 2018-02-06 NOTE — ED Notes (Signed)
Attempted to obtain labs x 2 without success. 

## 2018-02-06 NOTE — ED Notes (Signed)
Medications given with milk-pt has not eaten.  Pt eating breakfast after medications

## 2018-02-06 NOTE — BH Assessment (Signed)
[facility]  Per Juanetta BeetsJacqueline [name], DO, this pt does not require psychiatric hospitalization at this time.  [facility] treatment with the [facility] Team.  This has been included in pt's discharge instructions.  Pt reports that he has had difficulty staying in contact with his ACT Team.  At 10:31 this Clinical research associatewriter called [facility] and spoke to AlexisAlisa, [facility] and offer services to him.  Alisa agrees to staff this with the team.  Pt's nurse, Wille CelesteJanie, has been notified.  Doylene Canninghomas Fatumata Kashani, MA Triage Specialist [phone]

## 2018-02-06 NOTE — ED Notes (Addendum)
Pt's ACT team is here to pick him up.  Written dc instructions and follow up reviewed w/ pt.  Pt encouraged to take medications as directly and follow up with his ACT team.  Pt verbalized understanding.  Pt ambulatory w/o difficulty to dc area w/ mHt and ACT, belongings returned after leaving the area.

## 2018-02-06 NOTE — ED Notes (Signed)
TTS Trey at bedside eval pt at present.

## 2018-02-06 NOTE — ED Notes (Signed)
Pt to be dc'd, his act team will pick him up per [facility]
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
## 2018-02-06 NOTE — ED Notes (Signed)
Pt stated "I've been sleeping in the woods, hearing things, hearing voices and when I would get up it would all slow down."

## 2018-02-06 NOTE — ED Notes (Signed)
Report given to Latricia London, RN °

## 2018-02-06 NOTE — ED Notes (Signed)
Patient has 1 belonging bag placed in locker 26.

## 2018-02-06 NOTE — ED Notes (Signed)
Patient reports SI with a plan to over dose on heroin and AVH. Pt stated "I've been sleeping in the woods, hearing things, hearing voices and when I would get up it would all slow down." Plan of care discussed and encouragement and support provided and safety maintain. 1:1 monitoring in place.

## 2018-02-06 NOTE — ED Notes (Signed)
Pt's ride is here.  Pt aware, eating lunch

## 2018-02-06 NOTE — Discharge Instructions (Signed)
For your behavioral health needs, you are advised to continue treatment with the [facility] Team: ° °     [facility] °     [facility], [address] °     [address] °     [place], [place]  [address] °     [phone] °     Crisis number: [phone] °

## 2018-02-06 NOTE — ED Triage Notes (Signed)
Per [name] pt c/o A/V hallucinations, hasn't slept x 3 days.  Pt stated "I've been doing a lot of drugs.  I really hope they keep me."
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
## 2018-02-06 NOTE — ED Notes (Signed)
Dr Sharma CovertNorman into see.  Pt sleeping soundly, difficult to wake and kept nodding off during assessment.  Pt reports that he is suppose to be in court today and "is not going to make it."  Pt denies si/hi/avh at this time, and states that he needs "to be in the hospital to get regulated on my meds."  NAD, procedures explained.

## 2018-02-06 NOTE — ED Notes (Signed)
Pt presents with SI, plan to overdose on Heroin,  Pt reports a friend injected him with Narcan to keep him alive. HI towards other people and Auditory hallucinations note, hearing voices that God doesn't love him he reports. Pt has been sleeping in the woods.  A&O x 3, no distress noted, calm & cooperative.  Monitoring for safety, Q 15 min checks in effect.

## 2018-02-08 LAB — CULTURE, BLOOD (ROUTINE X 2)
CULTURE: NO GROWTH
CULTURE: NO GROWTH

## 2018-02-23 ENCOUNTER — Encounter (HOSPITAL_COMMUNITY): Payer: Self-pay | Admitting: Emergency Medicine

## 2018-02-23 ENCOUNTER — Emergency Department (HOSPITAL_COMMUNITY)
Admission: EM | Admit: 2018-02-23 | Discharge: 2018-02-23 | Disposition: A | Discharge status: Home / Self Care | Payer: Medicaid Other | Attending: Emergency Medicine | Admitting: Emergency Medicine

## 2018-02-23 ENCOUNTER — Other Ambulatory Visit: Payer: Self-pay

## 2018-02-23 DIAGNOSIS — F1910 Other psychoactive substance abuse, uncomplicated: Secondary | ICD-10-CM

## 2018-02-23 DIAGNOSIS — R45851 Suicidal ideations: Secondary | ICD-10-CM

## 2018-02-23 DIAGNOSIS — Z79899 Other long term (current) drug therapy: Secondary | ICD-10-CM | POA: Insufficient documentation

## 2018-02-23 DIAGNOSIS — F17210 Nicotine dependence, cigarettes, uncomplicated: Secondary | ICD-10-CM | POA: Insufficient documentation

## 2018-02-23 LAB — CBC
HEMATOCRIT: 39.7 % (ref 39.0–52.0)
Hemoglobin: 13.6 g/dL (ref 13.0–17.0)
MCH: 30.4 pg (ref 26.0–34.0)
MCHC: 34.3 g/dL (ref 30.0–36.0)
MCV: 88.8 fL (ref 78.0–100.0)
Platelets: 181 10*3/uL (ref 150–400)
RBC: 4.47 MIL/uL (ref 4.22–5.81)
RDW: 13.5 % (ref 11.5–15.5)
WBC: 7.7 10*3/uL (ref 4.0–10.5)

## 2018-02-23 LAB — RAPID URINE DRUG SCREEN, HOSP PERFORMED
Amphetamines: POSITIVE — AB
BARBITURATES: NOT DETECTED
Benzodiazepines: NOT DETECTED
Cocaine: POSITIVE — AB
Opiates: NOT DETECTED
TETRAHYDROCANNABINOL: POSITIVE — AB

## 2018-02-23 LAB — ETHANOL: Alcohol, Ethyl (B): <10 mg/dL (ref <10)

## 2018-02-23 LAB — COMPREHENSIVE METABOLIC PANEL
ALBUMIN: 4.3 g/dL (ref 3.5–5.0)
ALK PHOS: 67 U/L (ref 38–126)
ALT: 29 U/L (ref 17–63)
ANION GAP: 11 (ref 5–15)
AST: 21 U/L (ref 15–41)
BUN: 15 mg/dL (ref 6–20)
CO2: 23 mmol/L (ref 22–32)
Calcium: 9.2 mg/dL (ref 8.9–10.3)
Chloride: 104 mmol/L (ref 101–111)
Creatinine, Ser: 1.13 mg/dL (ref 0.61–1.24)
GFR calc Af Amer: >60 mL/min (ref >60)
GFR calc non Af Amer: >60 mL/min (ref >60)
GLUCOSE: 95 mg/dL (ref 65–99)
POTASSIUM: 3.8 mmol/L (ref 3.5–5.1)
SODIUM: 138 mmol/L (ref 135–145)
Total Bilirubin: 0.7 mg/dL (ref 0.3–1.2)
Total Protein: 7.7 g/dL (ref 6.5–8.1)

## 2018-02-23 LAB — ACETAMINOPHEN LEVEL

## 2018-02-23 LAB — LITHIUM LEVEL: LITHIUM LVL: 0.28 mmol/L — AB (ref 0.60–1.20)

## 2018-02-23 LAB — SALICYLATE LEVEL: Salicylate Lvl: <7 mg/dL (ref 2.8–30.0)

## 2018-02-23 MED ORDER — RISPERIDONE 1 MG PO TBDP: 2.0000 mg | ORAL_TABLET | Freq: Three times a day (TID) | ORAL | Status: DC | PRN

## 2018-02-23 MED ORDER — ONDANSETRON HCL 4 MG PO TABS: 4.0000 mg | ORAL_TABLET | Freq: Three times a day (TID) | ORAL | Status: DC | PRN

## 2018-02-23 MED ORDER — ZOLPIDEM TARTRATE 5 MG PO TABS: 5.0000 mg | ORAL_TABLET | Freq: Every evening | ORAL | Status: DC | PRN

## 2018-02-23 MED ORDER — LORAZEPAM 1 MG PO TABS: 1.0000 mg | ORAL_TABLET | ORAL | Status: DC | PRN

## 2018-02-23 MED ORDER — ZIPRASIDONE MESYLATE 20 MG IM SOLR: 20.0000 mg | INTRAMUSCULAR | Status: DC | PRN

## 2018-02-23 MED ORDER — ACETAMINOPHEN 325 MG PO TABS
650.0000 mg | ORAL_TABLET | ORAL | Status: DC | PRN
Start: 2018-02-23 — End: 2018-02-23

## 2018-02-23 NOTE — ED Notes (Signed)
BELONGINGS; CLOTHING TO INCLUDE PANTS AND SHIRT, ATHLETIC SHOES, SOCKS, BLACK AND RED WATCH, ONE PAIR OF EARRINGS WITH BLACK-COLOR STONE, BLACK AND GREEN WALLET, COPY OF ID, 0.66 CENTS.  [facility] INTCU

## 2018-02-23 NOTE — ED Provider Notes (Signed)
  Physical Exam  Ht  (1.753 m)   Wt 68 kg (150 lb)   BMI 22.15 kg/m   Physical Exam  ED Course/Procedures     Procedures  MDM  Seen by TTS and cleared for discharge.       Benjiman Core, MD 02/23/18 1433

## 2018-02-23 NOTE — ED Notes (Signed)
Bed: WA31 Expected date:  Expected time:  Means of arrival:  Comments: 

## 2018-02-23 NOTE — ED Notes (Signed)
Bed: WA33 Expected date:  Expected time:  Means of arrival:  Comments: 

## 2018-02-23 NOTE — ED Notes (Signed)
Pt discharged home. Discharged instructions read to pt who verbalized understanding. All belongings returned to pt. Denies SI/HI, is not delusional and not responding to internal stimuli. Escorted pt to the ED exit.   

## 2018-02-23 NOTE — ED Notes (Signed)
Pt has one white pt belongings' bag kept under the nurse's station in triage containing clothes.

## 2018-02-23 NOTE — ED Triage Notes (Signed)
Per [facility], patient shot up with heroin 5 hours ago, and has done meth yesterday. Patient states he has blisters in his mouth. Patient states he is having hallucinations and wants to kill himself.

## 2018-02-23 NOTE — ED Notes (Signed)
Blood drawn for lithium by Irfa RN.
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
## 2018-02-23 NOTE — Patient Outreach (Signed)
CPSS met with patient and provided substance use recovery support. CPSS utilized motivational interviewing skills to highlight patient strengths and hope that substance use recovery support. Patient is interested going to [employer] and attending NA meetings. CPSS will provide information for [facility], other substance use recovery housing resources, NA meeting list, residential/outpatient treatment center list, and CPSS contact information. CPSS strongly encouraged the patient to contact CPSS for substance use recovery support or help with substance use recovery resources.

## 2018-02-23 NOTE — ED Notes (Signed)
Bed: [facility] Expected date:  Expected time:  Means of arrival:  Comments: 

## 2018-03-02 ENCOUNTER — Other Ambulatory Visit: Payer: Self-pay

## 2018-03-02 ENCOUNTER — Encounter (HOSPITAL_COMMUNITY): Payer: Self-pay | Admitting: Emergency Medicine

## 2018-03-02 ENCOUNTER — Emergency Department (HOSPITAL_COMMUNITY)
Admission: EM | Admit: 2018-03-02 | Discharge: 2018-03-03 | Disposition: A | Discharge status: Home / Self Care | Payer: Medicaid Other | Attending: Emergency Medicine | Admitting: Emergency Medicine

## 2018-03-02 DIAGNOSIS — F1920 Other psychoactive substance dependence, uncomplicated: Secondary | ICD-10-CM | POA: Insufficient documentation

## 2018-03-02 DIAGNOSIS — F17210 Nicotine dependence, cigarettes, uncomplicated: Secondary | ICD-10-CM | POA: Insufficient documentation

## 2018-03-02 DIAGNOSIS — R002 Palpitations: Secondary | ICD-10-CM | POA: Insufficient documentation

## 2018-03-02 DIAGNOSIS — Z79899 Other long term (current) drug therapy: Secondary | ICD-10-CM | POA: Insufficient documentation

## 2018-03-02 DIAGNOSIS — F1994 Other psychoactive substance use, unspecified with psychoactive substance-induced mood disorder: Secondary | ICD-10-CM | POA: Insufficient documentation

## 2018-03-02 DIAGNOSIS — F1120 Opioid dependence, uncomplicated: Secondary | ICD-10-CM | POA: Diagnosis present

## 2018-03-02 DIAGNOSIS — R45851 Suicidal ideations: Secondary | ICD-10-CM | POA: Insufficient documentation

## 2018-03-02 DIAGNOSIS — F1910 Other psychoactive substance abuse, uncomplicated: Secondary | ICD-10-CM

## 2018-03-02 DIAGNOSIS — R0789 Other chest pain: Secondary | ICD-10-CM | POA: Insufficient documentation

## 2018-03-02 LAB — ETHANOL: Alcohol, Ethyl (B): <10 mg/dL (ref <10)

## 2018-03-02 LAB — COMPREHENSIVE METABOLIC PANEL
ALBUMIN: 4.1 g/dL (ref 3.5–5.0)
ALK PHOS: 55 U/L (ref 38–126)
ALT: 22 U/L (ref 17–63)
ANION GAP: 10 (ref 5–15)
AST: 19 U/L (ref 15–41)
BUN: 11 mg/dL (ref 6–20)
CHLORIDE: 106 mmol/L (ref 101–111)
CO2: 23 mmol/L (ref 22–32)
Calcium: 9.3 mg/dL (ref 8.9–10.3)
Creatinine, Ser: 0.84 mg/dL (ref 0.61–1.24)
GFR calc non Af Amer: >60 mL/min (ref >60)
GLUCOSE: 78 mg/dL (ref 65–99)
POTASSIUM: 3.7 mmol/L (ref 3.5–5.1)
SODIUM: 139 mmol/L (ref 135–145)
Total Bilirubin: 0.4 mg/dL (ref 0.3–1.2)
Total Protein: 7.3 g/dL (ref 6.5–8.1)

## 2018-03-02 LAB — RAPID URINE DRUG SCREEN, HOSP PERFORMED
AMPHETAMINES: POSITIVE — AB
Barbiturates: NOT DETECTED
Benzodiazepines: NOT DETECTED
Cocaine: POSITIVE — AB
Opiates: POSITIVE — AB
TETRAHYDROCANNABINOL: NOT DETECTED

## 2018-03-02 LAB — CBC WITH DIFFERENTIAL/PLATELET
BASOS PCT: 0 %
Basophils Absolute: 0 10*3/uL (ref 0.0–0.1)
EOS PCT: 2 %
Eosinophils Absolute: 0.1 10*3/uL (ref 0.0–0.7)
HEMATOCRIT: 38.2 % — AB (ref 39.0–52.0)
HEMOGLOBIN: 13.5 g/dL (ref 13.0–17.0)
LYMPHS ABS: 2 10*3/uL (ref 0.7–4.0)
LYMPHS PCT: 30 %
MCH: 31 pg (ref 26.0–34.0)
MCHC: 35.3 g/dL (ref 30.0–36.0)
MCV: 87.8 fL (ref 78.0–100.0)
Monocytes Absolute: 0.6 10*3/uL (ref 0.1–1.0)
Monocytes Relative: 10 %
NEUTROS ABS: 4 10*3/uL (ref 1.7–7.7)
Neutrophils Relative %: 58 %
Platelets: 217 10*3/uL (ref 150–400)
RBC: 4.35 MIL/uL (ref 4.22–5.81)
RDW: 13.5 % (ref 11.5–15.5)
WBC: 6.8 10*3/uL (ref 4.0–10.5)

## 2018-03-02 LAB — I-STAT TROPONIN, ED
TROPONIN I, POC: 0 ng/mL (ref 0.00–0.08)
Troponin i, poc: 0 ng/mL (ref 0.00–0.08)

## 2018-03-02 LAB — ACETAMINOPHEN LEVEL: Acetaminophen (Tylenol), Serum: <10 ug/mL — ABNORMAL LOW (ref 10–30)

## 2018-03-02 LAB — SALICYLATE LEVEL

## 2018-03-02 MED ORDER — SODIUM CHLORIDE 0.9 % IV BOLUS
1000.0000 mL | Freq: Once | INTRAVENOUS | Status: AC
  Administered 2018-03-02: 1000 mL via INTRAVENOUS
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
## 2018-03-02 NOTE — ED Triage Notes (Signed)
Patient states he used heroin prior to arrival "That's how I want to die. I want to overdose on heroin to die". Pt states "God won't give me the one thing that I want out of  Life. The feeling of being high; I can't get it".  Pt cooperative with care at this time and is pleasant with staff.

## 2018-03-02 NOTE — BH Assessment (Signed)
[facility] Assessment Progress Note   Pt unable to be assessed at this time.  Pt will not respond to name being called, pt sleeping at this time.

## 2018-03-03 ENCOUNTER — Emergency Department (HOSPITAL_COMMUNITY)
Admission: EM | Admit: 2018-03-03 | Discharge: 2018-03-03 | Disposition: A | Discharge status: Home / Self Care | Payer: Medicaid Other | Attending: Emergency Medicine | Admitting: Emergency Medicine

## 2018-03-03 ENCOUNTER — Encounter (HOSPITAL_COMMUNITY): Payer: Self-pay | Admitting: Behavioral Health

## 2018-03-03 DIAGNOSIS — F1120 Opioid dependence, uncomplicated: Secondary | ICD-10-CM | POA: Diagnosis present

## 2018-03-03 DIAGNOSIS — F39 Unspecified mood [affective] disorder: Secondary | ICD-10-CM

## 2018-03-03 DIAGNOSIS — F1920 Other psychoactive substance dependence, uncomplicated: Secondary | ICD-10-CM

## 2018-03-03 DIAGNOSIS — F319 Bipolar disorder, unspecified: Secondary | ICD-10-CM | POA: Insufficient documentation

## 2018-03-03 DIAGNOSIS — Z720 Tobacco use: Secondary | ICD-10-CM | POA: Insufficient documentation

## 2018-03-03 DIAGNOSIS — Z79899 Other long term (current) drug therapy: Secondary | ICD-10-CM | POA: Insufficient documentation

## 2018-03-03 DIAGNOSIS — F1910 Other psychoactive substance abuse, uncomplicated: Secondary | ICD-10-CM | POA: Insufficient documentation

## 2018-03-03 LAB — I-STAT TROPONIN, ED: TROPONIN I, POC: 0.01 ng/mL (ref 0.00–0.08)
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
## 2018-03-03 NOTE — ED Notes (Signed)
Overheard patient states he was going to throw his drink on staff, I personally talked with patient and told him not to threaten, throw objects or drink on staff.

## 2018-03-03 NOTE — Progress Notes (Signed)
TTS consulted with Nira Conn, NP who recommends continued observation for safety and to be reassessed in the AM by psych. However after speaking with EDP Tilden Fossa, MD, she believes the pt is stable for d/c and recommends he follow up with his current ACTT team. TTS has reviewed the chart and pt has a hx of frequent ED visits and possible secondary gain due to expressing SI while in the ED. Prior to the 2 assessments the pt had today, pt has also been assessed by TTS on 02/06/18, 01/30/18, 01/18/18, 01/03/18, 12/24/17, 12/18/17 and multiple other assessments. EDP feels the pt is stabilized for d/c and pt is recommended for follow up with his current ACTT team.   Princess Bruins, MSW, LCSW Therapeutic Triage Specialist  [phone]
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
## 2018-03-03 NOTE — BH Assessment (Signed)
[facility] Assessment Progress Note   Patient still sleeping, has not awoken since clinician attempted to awaken him earlier.
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
## 2018-03-03 NOTE — ED Notes (Signed)
Bed: WU98 Expected date:  Expected time:  Means of arrival:  Comments: Ems 79m weakness

## 2018-03-03 NOTE — ED Notes (Signed)
Pt was cussing at staff and refused vitals, refusing to leave, and refusing to sign. Security escorted him off the property.

## 2018-03-03 NOTE — ED Notes (Signed)
Pt has one bag of cloths in cabinet 16-18

## 2018-03-03 NOTE — ED Triage Notes (Signed)
Pt arriving via EMS. Pt told EMS and [name] that he injected cocaine and heroin approx 10 minutes ago. Pt also states he took meth last night. Pt was in behavior health unit of [facility] ED last night and discharged this morning. Pt stated he was trying to take his life.

## 2018-03-03 NOTE — Discharge Instructions (Signed)
Opioid Use Disorder Opioids are powerful substances that relieve pain. Opioids include illegal drugs, such as heroin, as well as prescription pain medicines, such as codeine, morphine, hydrocodone, oxycodone, and fentanyl. Opioid use disorder is when you take opioids for nonmedical reasons even though taking them hurts your health and well-being. Taking prescribed opioids regularly can lead to dependence, especially if you take them in larger amounts or more often than they should be taken. Opioid use disorder often disrupts life at home, work, or school. It can cause mental and physical problems. It also increases your risk of suicide and death from overdose. What are the causes? This condition is caused by taking opioids. Taking opioids repeatedly results in changes in the brain that make it hard to control opioid use. Many people develop this condition because they like the way they feel when they take opioids or because they get addicted to them. What increases the risk? This condition is more likely to develop in:  People with a family history of opioid use disorder.  People who misuse other drugs.  People with a mental illness, such as depression, post-traumatic stress disorder, or antisocial personality disorder.  People who begin use at an early age, such as during their teen years.  What are the signs or symptoms? Symptoms of this condition include:  Taking greater amounts of an opioid than you want to or for longer than you want to.  Trying several times to control your opioid use.  Spending a lot of time getting opioids, using them, or recovering from their effects.  Craving opioids.  Having problems at work, at school, at home, or in a relationship because of opioid use.  Giving up or cutting down on important life activities because of opioid use.  Using opioids when it is dangerous, such as when driving a car.  Continuing to use an opioid even though it has led to a  physical problem, such as: ? Severe constipation. ? Poor nutrition. ? Infertility. ? Tuberculosis. ? Aspiration pneumonia. ? An infection, such as hepatitis or HIV (human immunodeficiency virus).  Continuing to use an opioid even though it is causing a mental problem, such as: ? Depression or anxiety. ? Hallucinations. ? Sleep problems. ? Loss of interest in sex.  Needing more and more of an opioid to get the same effect that you want from the opioid (building up a tolerance).  Having symptoms of withdrawal when you stop using an opioid. Some symptoms of withdrawal are: ? Depression or anxiety. ? Irritability. ? Nausea or vomiting. ? Muscle aches or spasms. ? Watery eyes. ? Trouble sleeping. ? Yawning.  How is this diagnosed? This condition is diagnosed with an assessment. During the assessment, your health care provider will ask about your opioid use and how it affects your life. Your health care provider may perform a physical exam or do lab tests to see if you have physical problems resulting from opioid use. Your health care provider may also screen for drug use and refer you to a mental health professional for evaluation. How is this treated? Treatment for this condition is usually provided by mental health professionals with training in substance use disorders. The first step in treatment is detoxification, which involves taking medicines to lessen withdrawal symptoms. Additional treatment may involve:  Counseling. This treatment is also called talk therapy. It is provided by substance use treatment counselors. A counselor can address the reasons you use opioids and suggest ways to keep you from using opioids   again. The goals of talk therapy are to: ? Find healthy activities and ways to cope with stress. ? Identify and avoid what triggers your opioid use. ? Help you learn how to handle cravings.  Support groups. Support groups are run by people who have quit using opioids.  They provide emotional support, advice, and guidance.  A medicine that blocks opioid receptors in your brain. This medicine can reduce opioid cravings that lead to relapse. This medicine also blocks the good feeling that you get from using opioids.  Opioid maintenance treatment. This involves taking certain kinds of opioid medicines. These medicines satisfy cravings but are safer than commonly misused opioids. This is often the best option for people who continue to relapse with other treatments.  Follow these instructions at home:  Take over-the-counter and prescription medicines only as told by your health care provider.  Check with your health care provider before starting any new medicines.  Keep all follow-up visits as told by your health care provider. This is important. Where to find more information:  National Institute on Drug Abuse: www.drugabuse.gov  Substance Abuse and Mental Health Services Administration: www.samhsa.gov Contact a health care provider if:  You are not able to take your medicines as told.  Your symptoms get worse. Get help right away if:  You may have taken too much of an opioid (overdosed).  You have serious thoughts about hurting yourself or others. If you ever feel like you may hurt yourself or others, or have thoughts about taking your own life, get help right away. You can go to your nearest emergency department or call:  Your local emergency services (911 in the [place]  A suicide crisis helpline, such as the National Suicide Prevention Lifeline at 1-800-273-8255. This is open 24 hours a day.  This information is not intended to replace advice given to you by your health care provider. Make sure you discuss any questions you have with your health care provider. Document Released: 07/30/2007 Document Revised: 07/14/2016 Document Reviewed: 07/14/2016 Elsevier Interactive Patient Education  2018 Elsevier Inc.  

## 2018-03-03 NOTE — ED Notes (Signed)
Bed: WHALD Expected date:  Expected time:  Means of arrival:  Comments: 
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
## 2018-03-10 ENCOUNTER — Emergency Department (HOSPITAL_COMMUNITY)
Admission: EM | Admit: 2018-03-10 | Discharge: 2018-03-10 | Disposition: A | Discharge status: Home / Self Care | Payer: Medicaid Other | Attending: Emergency Medicine | Admitting: Emergency Medicine

## 2018-03-10 ENCOUNTER — Encounter (HOSPITAL_COMMUNITY): Payer: Self-pay | Admitting: Emergency Medicine

## 2018-03-10 DIAGNOSIS — F1910 Other psychoactive substance abuse, uncomplicated: Secondary | ICD-10-CM | POA: Insufficient documentation

## 2018-03-10 DIAGNOSIS — Z5321 Procedure and treatment not carried out due to patient leaving prior to being seen by health care provider: Secondary | ICD-10-CM | POA: Insufficient documentation

## 2018-03-10 LAB — RAPID URINE DRUG SCREEN, HOSP PERFORMED
AMPHETAMINES: POSITIVE — AB
BARBITURATES: NOT DETECTED
Benzodiazepines: NOT DETECTED
Cocaine: POSITIVE — AB
Opiates: NOT DETECTED
TETRAHYDROCANNABINOL: NOT DETECTED

## 2018-03-10 NOTE — ED Notes (Signed)
Pt denied blood draw, and stated they are leaving.

## 2018-03-10 NOTE — ED Triage Notes (Addendum)
Pt arrives ambulatory with ems for c/o cp after  Doing cocaine/ smoking crack 1 hour ago   Today, states wants help getting off drugs states doesn't want to go to [facility] because they throw him out, states  Did meth today also and heroine last night

## 2018-03-13 ENCOUNTER — Emergency Department (HOSPITAL_COMMUNITY): Admission: EM | Admit: 2018-03-13 | Discharge: 2018-03-13 | Discharge status: Against Medical Advice | Payer: Self-pay

## 2018-03-13 NOTE — ED Notes (Signed)
Patient stated did not want to wait anymore.

## 2018-03-14 ENCOUNTER — Emergency Department (HOSPITAL_COMMUNITY)
Admission: EM | Admit: 2018-03-14 | Discharge: 2018-03-15 | Disposition: A | Discharge status: Home / Self Care | Payer: Self-pay | Attending: Emergency Medicine | Admitting: Emergency Medicine

## 2018-03-14 ENCOUNTER — Encounter (HOSPITAL_COMMUNITY): Payer: Self-pay | Admitting: Emergency Medicine

## 2018-03-14 ENCOUNTER — Other Ambulatory Visit: Payer: Self-pay

## 2018-03-14 DIAGNOSIS — F209 Schizophrenia, unspecified: Secondary | ICD-10-CM | POA: Insufficient documentation

## 2018-03-14 DIAGNOSIS — F419 Anxiety disorder, unspecified: Secondary | ICD-10-CM | POA: Insufficient documentation

## 2018-03-14 DIAGNOSIS — F1910 Other psychoactive substance abuse, uncomplicated: Secondary | ICD-10-CM | POA: Insufficient documentation

## 2018-03-14 DIAGNOSIS — F909 Attention-deficit hyperactivity disorder, unspecified type: Secondary | ICD-10-CM | POA: Insufficient documentation

## 2018-03-14 DIAGNOSIS — F17210 Nicotine dependence, cigarettes, uncomplicated: Secondary | ICD-10-CM | POA: Insufficient documentation

## 2018-03-14 DIAGNOSIS — R45851 Suicidal ideations: Secondary | ICD-10-CM | POA: Insufficient documentation

## 2018-03-14 DIAGNOSIS — F1220 Cannabis dependence, uncomplicated: Secondary | ICD-10-CM | POA: Insufficient documentation

## 2018-03-14 DIAGNOSIS — Z590 Homelessness: Secondary | ICD-10-CM | POA: Insufficient documentation

## 2018-03-14 DIAGNOSIS — Z79899 Other long term (current) drug therapy: Secondary | ICD-10-CM | POA: Insufficient documentation

## 2018-03-14 LAB — CBC
HEMATOCRIT: 41.1 % (ref 39.0–52.0)
Hemoglobin: 14.3 g/dL (ref 13.0–17.0)
MCH: 30.2 pg (ref 26.0–34.0)
MCHC: 34.8 g/dL (ref 30.0–36.0)
MCV: 86.7 fL (ref 78.0–100.0)
PLATELETS: 234 10*3/uL (ref 150–400)
RBC: 4.74 MIL/uL (ref 4.22–5.81)
RDW: 13.1 % (ref 11.5–15.5)
WBC: 7.4 10*3/uL (ref 4.0–10.5)

## 2018-03-14 LAB — COMPREHENSIVE METABOLIC PANEL
ALT: 157 U/L — ABNORMAL HIGH (ref 17–63)
ANION GAP: 8 (ref 5–15)
AST: 83 U/L — AB (ref 15–41)
Albumin: 4.1 g/dL (ref 3.5–5.0)
Alkaline Phosphatase: 57 U/L (ref 38–126)
BILIRUBIN TOTAL: 0.7 mg/dL (ref 0.3–1.2)
BUN: 7 mg/dL (ref 6–20)
CO2: 27 mmol/L (ref 22–32)
Calcium: 9.9 mg/dL (ref 8.9–10.3)
Chloride: 104 mmol/L (ref 101–111)
Creatinine, Ser: 0.88 mg/dL (ref 0.61–1.24)
Glucose, Bld: 93 mg/dL (ref 65–99)
POTASSIUM: 3.8 mmol/L (ref 3.5–5.1)
Sodium: 139 mmol/L (ref 135–145)
TOTAL PROTEIN: 7.2 g/dL (ref 6.5–8.1)

## 2018-03-14 LAB — ACETAMINOPHEN LEVEL: Acetaminophen (Tylenol), Serum: <10 ug/mL — ABNORMAL LOW (ref 10–30)

## 2018-03-14 LAB — SALICYLATE LEVEL

## 2018-03-14 LAB — RAPID URINE DRUG SCREEN, HOSP PERFORMED
Amphetamines: POSITIVE — AB
BARBITURATES: NOT DETECTED
BENZODIAZEPINES: NOT DETECTED
COCAINE: NOT DETECTED
OPIATES: NOT DETECTED
Tetrahydrocannabinol: NOT DETECTED

## 2018-03-14 LAB — ETHANOL: Alcohol, Ethyl (B): <10 mg/dL (ref <10)

## 2018-03-14 NOTE — ED Notes (Signed)
Patient given turkey sandwich and drink

## 2018-03-14 NOTE — ED Notes (Signed)
Called for sitter, security in route to wand.
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
## 2018-03-15 ENCOUNTER — Encounter (HOSPITAL_COMMUNITY): Payer: Self-pay | Admitting: Registered Nurse

## 2018-03-15 LAB — LITHIUM LEVEL: LITHIUM LVL: 0.19 mmol/L — AB (ref 0.60–1.20)

## 2018-03-15 MED ORDER — BUSPIRONE HCL 10 MG PO TABS: 5.0000 mg | ORAL_TABLET | Freq: Three times a day (TID) | ORAL | Status: DC

## 2018-03-15 MED ORDER — BUPROPION HCL ER (XL) 150 MG PO TB24
300.0000 mg | ORAL_TABLET | Freq: Every day | ORAL | Status: DC
  Administered 2018-03-15: 300 mg via ORAL
  Filled 2018-03-15: qty 2

## 2018-03-15 MED ORDER — LITHIUM CARBONATE ER 300 MG PO TBCR
600.0000 mg | EXTENDED_RELEASE_TABLET | Freq: Two times a day (BID) | ORAL | Status: DC
  Administered 2018-03-15: 600 mg via ORAL
  Filled 2018-03-15: qty 2
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
## 2018-03-15 NOTE — ED Provider Notes (Signed)
Pt seen by Dr. Lucianne Muss and Denice Bors Rankin today and feel that pt is clear for d/c.  Pt has an ACT team that sees him regularly and helps him with medication compliance.  Discussed polysubstance abstinence.  Pt d/ced home with his ACT team who picked him up.   Gwyneth Sprout, MD 03/15/18 [phone]

## 2018-03-15 NOTE — ED Notes (Addendum)
TTS device at bedside. Assessment underway.   Pt continues to act sleepy, mumbling limited [facility]. Refuses to open eyes during conversation.   Belongings moved to [employer] [facility]

## 2018-03-15 NOTE — ED Notes (Addendum)
Pt signed medical clearance policy, valuables with security, medications with pharmacy, and 3 bags in locker 7.

## 2018-03-15 NOTE — ED Notes (Signed)
Pt on telephone, got angry and threw phone off of desk. Pt taken back to room and cooperative now.

## 2018-03-15 NOTE — Progress Notes (Signed)
CSW asked to contact [facility] [phone] to verify services to patient, find out if he can get a cell phone, ask about transportation, and find out if he can get money.  Spoke to [place] on the crisis line who relates that patient is seen daily for medication management.  Pt was see in their office yesterday morning and was given his medication.  Pt also received a check and a grocery store gift card.  Also reported that pt has been given cell phones multiple times and either loses them or sells them.  Patient has been seen in various ED's 26 times in the past 6 months.  CSW contacted [facility] to notify and asked [facility] CSW to contact ACTT team to pick him up.  Timmothy Euler. Kaylyn Lim, MSW, LCSWA Disposition Clinical Social Work [phone] (cell) [phone] (office)

## 2018-03-15 NOTE — ED Notes (Signed)
Pt given all belongings and valuables in security, pt stated everything was counted for. [facility] team picked up pt.

## 2018-03-15 NOTE — ED Notes (Signed)
Pt requesting daily medications, no orders in. MD made aware

## 2018-03-15 NOTE — ED Notes (Signed)
ED Provider at bedside. 

## 2018-03-15 NOTE — ED Notes (Signed)
Regular Diet was ordered for Lunch. 

## 2018-03-15 NOTE — ED Notes (Signed)
TTS set up for pt 

## 2018-03-15 NOTE — BH Assessment (Addendum)
Tele Assessment Note   Patient Name: Thomas [name] MRN: [id number] Referring Physician: Dr. Judd Lien  Location of Patient: [place] Location of Provider: Behavioral Health TTS Department  Thomas [name] Thomas [name] is an 22 y.o. male.  Pt came in and reported he used crack cocaine, heroin, crystal meth, cough syrup and alcohol.  The pt reported to an RN he layed down in the street in an attempt to kill himself.  The pt also told an RN he was hearing hidden messages in an NA meeting earlier today.  The pt still reports he is suicidal and stated he will kill hisself in anyway possible.  The pt stated he last tried to kill himself 3 days ago by trying to overdose.  The pt is currently homeless.  He has had 26 visits to the emergency room since February 2019.  The pt has also visited other emergency rooms in the area ([facility]).  He currently has ACTT and receives his counseling and medication management through [facility].   The pt denies having access to guns and HI.  The pt denied having any legal issues. However, according to a previous note, the pt had a court date for larceny May 30.  It is unclear if the pt went to court.  The pt UDS was positive for meth.  His UDS was negative for all other substances.  In his previous visits, the pt is usually positive for opiates, cocaine and meth.      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 ADHD (attention deficit hyperactivity disorder)   . Anxiety   . Bipolar 1 disorder (HCC)   . Current smoker   . Depression   . Eating disorder   . Headache(784.0)   . History of ADHD 11/01/2015  . Medical history non-contributory   . Mental disorder   . Nicotine dependence 11/03/2015  . 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  Schizophrenia (HCC)     History reviewed. No pertinent surgical history.  Family History: No family history on file.  Social History:  reports that he has been smoking cigarettes.  He has been smoking about 0.00 packs per day for the past 4.00 years. He has never used smokeless tobacco. He reports that he drinks alcohol. He reports that he has current or past drug history. Drugs: Marijuana, Cocaine, LSD, MDMA (Ecstacy), and Methamphetamines. Frequency: 7.00 times per week.  Additional Social History:  Alcohol / Drug Use Pain Medications: See MAR Prescriptions: See MAR Over the Counter: See MAR History of alcohol / drug use?: Yes Longest period of sobriety (when/how long): unknown Substance #1 Name of Substance 1: crystal meth 1 - Age of First Use: unable to assess 1 - Amount (size/oz): UTA 1 - Frequency: UTA 1 - Duration: UTA 1 - Last Use / Amount: UTA Substance #2 Name of Substance 2: crack cocaine 2 - Age of First Use: UTA 2 - Amount (size/oz): UTA 2 - Frequency: UTA 2 - Duration: UTA 2 - Last Use / Amount: UTA Substance #3 Name of Substance 3: heroin 3 - Age of First Use: UTA 3 - Amount (size/oz): UTA 3 - Frequency: UTA 3 - Duration: UTA 3 - Last Use / Amount: UTA  CIWA: CIWA-Ar BP: (!) 93/52 Pulse Rate: 80 COWS:    Allergies: No Known Allergies  Home Medications:  (Not in a hospital admission)  OB/GYN Status:  No LMP for male patient.  General Assessment Data Assessment unable to be completed: Yes Reason for not completing assessment: RN busy at this time.  RN will call back Location of Assessment: [facility] ED TTS Assessment: In system Is this a Tele or Face-to-Face Assessment?: Tele Assessment Is this an Initial Assessment or a Re-assessment for this encounter?: Initial Assessment Marital status: Single Maiden name: NA Is patient pregnant?: No Pregnancy Status: Other (Comment)(male) Living Arrangements: Other (Comment)(homeless) Can pt return to current  living arrangement?: Yes Admission Status: Voluntary Is patient capable of signing voluntary admission?: Yes Referral Source: Self/Family/Friend Insurance type: Self Pay     Crisis Care Plan Living Arrangements: Other (Comment)(homeless) Legal Guardian: Other:(Self) [facility]rist[facility]  Name of Therapist: [facility]   Education Status Is patient currently in school?: No  Risk to self with the past 6 months Suicidal Ideation: Yes-Currently Present Has patient been a risk to self within the past 6 months prior to admission? : Yes Suicidal Intent: Yes-Currently Present Has patient had any suicidal intent within the past 6 months prior to admission? : Yes Is patient at risk for suicide?: Yes Suicidal Plan?: Yes-Currently Present Has patient had any suicidal plan within the past 6 months prior to admission? : Yes Specify Current Suicidal Plan: Any way possible Access to Means: Yes Specify Access to Suicidal Means: can do various things to kill himself What has been your use of drugs/alcohol within the last 12 months?: meth, cocaine, heroin Previous Attempts/Gestures: Yes How many times?: 3 Other Self Harm Risks: excessive drug use Triggers for Past Attempts: Unpredictable Intentional Self Injurious Behavior: None Comment - Self Injurious Behavior: (none) Family Suicide History: Unknown Recent stressful life event(s): Other (Comment)(homeless) Persecutory voices/beliefs?: No Depression: Yes Depression Symptoms: Feeling worthless/self pity Substance abuse history and/or treatment for substance abuse?: Yes Suicide prevention information given to non-admitted patients: Not applicable  Risk to Others within the past 6 months Homicidal Ideation: No Does patient have any lifetime risk of violence toward others beyond the six months prior to admission? : No Thoughts of Harm to Others: No Comment - Thoughts of Harm to Others:  NA Current Homicidal Intent: No Current Homicidal Plan: No Access to Homicidal Means: No Describe Access to Homicidal Means: none Identified Victim: none History of harm to others?: No Assessment of Violence: None Noted Violent Behavior Description: none Does patient have access to weapons?: No Criminal Charges Pending?: Yes Describe Pending Criminal Charges: larceny Does patient have a court date: Yes Court Date: 03/14/18(possibly missed court date) Is patient on probation?: Unknown  Psychosis Hallucinations: Auditory Delusions: None noted  Mental Status Report Appearance/Hygiene: In scrubs, Unremarkable Eye Contact: Poor Motor Activity: Unable to assess Speech: Slurred, Slow, Soft Level of Consciousness: Drowsy Mood: Irritable Affect: Irritable Anxiety Level: Minimal Thought Processes: Coherent, Relevant Judgement: Impaired Orientation: Person, Place, Time, Situation, Appropriate for developmental age Obsessive Compulsive Thoughts/Behaviors: None  Cognitive Functioning Concentration: Normal Memory: Recent Intact, Remote Intact Is patient IDD: No Is patient DD?: No Insight: Poor Impulse Control: Poor Appetite: Fair Have you had any weight changes? : No Change Sleep: No Change Total Hours of Sleep: 6 Vegetative Symptoms: None  ADLScreening [facility]) Patient's cognitive ability adequate to safely complete daily activities?: Yes Patient able to express need for assistance with ADLs?: Yes Independently performs ADLs?: Yes (appropriate for developmental age)  Prior Inpatient Therapy Prior Inpatient Therapy: Yes Prior Therapy  Dates: 2019 and mult admissions Prior Therapy Facilty/Provider(s): [facility], [facility], [facility] Reason for Treatment: SA and MH  Prior Outpatient Therapy Prior Outpatient Therapy: Yes Prior Therapy Dates: CURRENT Prior Therapy [facility]ilty[facility]  Reason for Treatment: Medication management and  counseling.  Does patient have an ACCT team?: Yes Does patient have Intensive In-House Services?  : No Does patient have [facility] services? : No Does patient have P4CC services?: No  ADL Screening (condition at time of admission) Patient's cognitive ability adequate to safely complete daily activities?: Yes Patient able to express need for assistance with ADLs?: Yes Independently performs ADLs?: Yes (appropriate for developmental age)       Abuse/Neglect Assessment (Assessment to be complete while patient is alone) Abuse/Neglect Assessment Can Be Completed: Yes Physical Abuse: Denies Verbal Abuse: Denies Sexual Abuse: Denies Exploitation of patient/patient's resources: Denies Self-Neglect: Denies Values / Beliefs Cultural Requests During Hospitalization: None Spiritual Requests During Hospitalization: None Consults Spiritual Care Consult Needed: No Social Work Consult Needed: No            Disposition:  Disposition Initial Assessment Completed for this Encounter: Yes   PA Thomas [name] recommends the pt be observed for safety and stabilization and then reassessed by psychiatry.  Its also recommended to contact [facility].  RN Thomas [name] was made aware of the recommendation.  This service was provided via telemedicine using a 2-way, interactive audio and video technology.  Names of all persons participating in this telemedicine service and their role in this encounter. Name: Thomas [name] Role: PT  Name: Thomas [name] Role: TTS  Name:  Role:   Name:  Role:     Ottis Stain 03/15/2018 6:31 AM

## 2018-03-15 NOTE — Progress Notes (Signed)
CSW consulted to reach out to pt's [facility] to pick pt up. CSW has reached out [phone] as well as [phone]. CSW spoke with Dianna and was informed that someone would be to pick pt up within an hours. CSW agreeable to this. CSW has updated RN and MD of this at this time. There are no further CSW needs CSW will sign off.    Montel Clock S. Shyasia Funches, MSW, LCSW-A Emergency Department Clinical Social Worker [phone]

## 2018-03-15 NOTE — BH Assessment (Signed)
Attempted to contact pt ACTT.  There was not an answer and a HIPPA compliant message was left on the voicemail.

## 2018-03-15 NOTE — Consult Note (Signed)
Tele Assessment   Thomas [name], 22 y.o., male [facility] complaints that he used crack cocaine, heroin, crystal meth, cough syrup and alcohol; then laid down in street in an attempt to kill himself.  Patient seen via telepsych by this provider; chart reviewed and consulted with Dr. Lucianne MussKumar on 03/15/18.  On evaluation Thomas [name] reports that he came to the hospital because he is hearing voices every morning and seeing things; " and I want to die."  Patient states that he has been doing crystal meth every day " and I want to get put in a hospital or go to rehab cause I want to come off of the drugs.  I want to get regulated on my medicine so that I feel better."  Patient states that he has an ACTT team through [facility] that he does not see them often because they do not know where to find him.  States that he did see them yesterday but they do not do anything to help him. "  They have my money and not been waiting on them to buy me a phone for over a month and I do not have a phone yet."  Patient states " if I say I am going to kill myself I have a right to go to the hospital.  Don't I have that right?  I want to go to rehab."  This provider explained to patient that the majority of rehabilitation facilities do not accept suicidal patients related to most being open bed facilities and they do not have the staff for one-to-one observation and taking a suicidal patient would be too big of a risk for them.  Patient then stated " so far say I am not suicidal can you get me in rehab?  Inform patient that we could send his information to acute rehab facilities but there was no guarantee that there would be beds available.  Patient asked about the multiple times he has visited the ED if he had ever been given referral/resource information on short/long-term rehab facilities and other community resources material for substance abuse services.  Patient states that he has never been given  any referrals or any information. "  And I do not have a phone so I cannot call anyone to get any information.  I do not have any money so I do not have a way to get anywhere for any services."  Patient states that he is living in a motel but the motel does not have a phone.  Patient states that he has never heard of [facility] and has never been referred there.  After speaking with patient was on and off stating that he was suicidal.  And at this time he states that he is not suicidal and he is not hearing any voices or seeing things. During evaluation Thomas [name] is alert/oriented x 4; calm/cooperative; and mood congruent with affect.  He does not appear to be responding to internal/external stimuli or delusional thoughts.  Patient denied suicidal/self-harm/homicidal ideation, psychosis, and paranoia.  Patient answered question appropriately. [facility] and was informed that ACTT team meets with Thomas [name] every day and he is given his medications daily; that I found has been brought for patient at least once a month for more and that the patient either sells it or loses it.  Also reports that patient has no problem with transportation that patient is able to get all over he  is in /[place] or wherever he wants to go.  Reports when patient was seen yesterday morning there were no complaints of suicide/homicide ideation or psychosis.  Patient was given his medication, money, and a gift card.  Recommendations: Patient psychiatrically cleared.  We will order a peers-support referral to make sure patient has been given referral/resources for community services, outpatient psychiatric services, long/short-term rehab facility, and Medicaid transport information.  Patient is to continue to follow-up with current outpatient psychiatric provider and [facility] team.   Disposition: No evidence of imminent risk to self or others at present.    Patient does not meet criteria for psychiatric inpatient admission.  Assunta Found, NP

## 2018-03-15 NOTE — ED Notes (Signed)
Placed pt's belongings (two pt belongings bags and a backpack) in locker #2 in [facility].  Not inventoried.

## 2018-10-30 ENCOUNTER — Encounter (HOSPITAL_COMMUNITY): Payer: Self-pay

## 2018-10-30 ENCOUNTER — Emergency Department (HOSPITAL_COMMUNITY)
Admission: EM | Admit: 2018-10-30 | Discharge: 2018-10-30 | Disposition: A | Discharge status: Home / Self Care | Payer: Medicaid Other | Attending: Emergency Medicine | Admitting: Emergency Medicine

## 2018-10-30 DIAGNOSIS — Z760 Encounter for issue of repeat prescription: Secondary | ICD-10-CM

## 2018-10-30 DIAGNOSIS — F22 Delusional disorders: Secondary | ICD-10-CM

## 2018-10-30 DIAGNOSIS — Z79899 Other long term (current) drug therapy: Secondary | ICD-10-CM | POA: Insufficient documentation

## 2018-10-30 DIAGNOSIS — F17210 Nicotine dependence, cigarettes, uncomplicated: Secondary | ICD-10-CM | POA: Insufficient documentation

## 2018-10-30 LAB — CBC
HCT: 41.4 % (ref 39.0–52.0)
Hemoglobin: 14.2 g/dL (ref 13.0–17.0)
MCH: 30.3 pg (ref 26.0–34.0)
MCHC: 34.3 g/dL (ref 30.0–36.0)
MCV: 88.5 fL (ref 80.0–100.0)
NRBC: 0 % (ref 0.0–0.2)
Platelets: 179 10*3/uL (ref 150–400)
RBC: 4.68 MIL/uL (ref 4.22–5.81)
RDW: 12.8 % (ref 11.5–15.5)
WBC: 8.4 10*3/uL (ref 4.0–10.5)

## 2018-10-30 LAB — ETHANOL: Alcohol, Ethyl (B): <10 mg/dL (ref <10)

## 2018-10-30 LAB — COMPREHENSIVE METABOLIC PANEL
ALBUMIN: 4.3 g/dL (ref 3.5–5.0)
ALT: 38 U/L (ref 0–44)
AST: 32 U/L (ref 15–41)
Alkaline Phosphatase: 59 U/L (ref 38–126)
Anion gap: 10 (ref 5–15)
BUN: 13 mg/dL (ref 6–20)
CHLORIDE: 102 mmol/L (ref 98–111)
CO2: 26 mmol/L (ref 22–32)
Calcium: 9.3 mg/dL (ref 8.9–10.3)
Creatinine, Ser: 0.94 mg/dL (ref 0.61–1.24)
GFR calc Af Amer: >60 mL/min (ref >60)
Glucose, Bld: 99 mg/dL (ref 70–99)
POTASSIUM: 3.6 mmol/L (ref 3.5–5.1)
SODIUM: 138 mmol/L (ref 135–145)
Total Bilirubin: 0.6 mg/dL (ref 0.3–1.2)
Total Protein: 7.4 g/dL (ref 6.5–8.1)

## 2018-10-30 LAB — RAPID URINE DRUG SCREEN, HOSP PERFORMED
AMPHETAMINES: POSITIVE — AB
BENZODIAZEPINES: NOT DETECTED
Barbiturates: NOT DETECTED
Cocaine: NOT DETECTED
OPIATES: NOT DETECTED
TETRAHYDROCANNABINOL: NOT DETECTED

## 2018-10-30 LAB — SALICYLATE LEVEL: Salicylate Lvl: <7 mg/dL (ref 2.8–30.0)

## 2018-10-30 LAB — ACETAMINOPHEN LEVEL: Acetaminophen (Tylenol), Serum: <10 ug/mL — ABNORMAL LOW (ref 10–30)

## 2018-10-30 MED ORDER — ALUM & MAG HYDROXIDE-SIMETH 200-200-20 MG/5ML PO SUSP: 30.0000 mL | Freq: Four times a day (QID) | ORAL | Status: DC | PRN

## 2018-10-30 MED ORDER — ACETAMINOPHEN 325 MG PO TABS: 650.0000 mg | ORAL_TABLET | ORAL | Status: DC | PRN

## 2018-10-30 MED ORDER — CLONIDINE HCL 0.1 MG PO TABS: 0.1000 mg | ORAL_TABLET | Freq: Two times a day (BID) | ORAL | 0 refills | Status: DC

## 2018-10-30 MED ORDER — NICOTINE 21 MG/24HR TD PT24: 21.0000 mg | MEDICATED_PATCH | Freq: Every day | TRANSDERMAL | Status: DC

## 2018-10-30 MED ORDER — BUSPIRONE HCL 7.5 MG PO TABS: 7.5000 mg | ORAL_TABLET | Freq: Two times a day (BID) | ORAL | 0 refills | Status: DC

## 2018-10-30 MED ORDER — BUPROPION HCL ER (XL) 300 MG PO TB24: 300.0000 mg | ORAL_TABLET | Freq: Every day | ORAL | 0 refills | Status: DC

## 2018-10-30 NOTE — ED Provider Notes (Signed)
Thomas [name] is medically cleared and cleared by behavioral health services, they recommend patient be started on his home meds.  He has follow-up information.    Eyvonne Mechanic, PA-C 10/30/18 [id number]    Jacalyn Lefevre, MD 10/30/18 [id number]

## 2018-10-30 NOTE — ED Notes (Signed)
TTS in process 

## 2018-10-30 NOTE — ED Notes (Signed)
Pt continuing to fall asleep during assessment, will attempt at a later time
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
## 2018-10-30 NOTE — Discharge Instructions (Signed)
Please read attached information. If you experience any new or worsening signs or symptoms please return to the emergency room for evaluation. Please follow-up with your primary care provider or specialist as discussed. Please use medication prescribed only as directed and discontinue taking if you have any concerning signs or symptoms.   °

## 2018-10-30 NOTE — ED Notes (Signed)
Belongings returned to pt and discharge paperwork reviewed with pt.

## 2018-10-30 NOTE — Consult Note (Signed)
  Tele psych Assessment   Thomas [name], 23 y.o., male patient seen via tele psych by TTS and this provider; chart reviewed and consulted with Dr. Lucianne Muss on 10/30/18.  On evaluation Thomas [name] reports He is feeling better this morning.  "I only came to the hospital because I wanted to get some medication until I can see Wellbutrin 300 mg daily, Buspar, and Clonidine; but cant remember the exact doses.  States he hasn't taken Lithium in over a year.  Has an appointment with [facility] Friday for intake.  Reports he has been in [place] for the last 6 months with his father.  "and when I came back here I went to jail.  I got out of jail 2 days ago."  Patient that his mother is a part of his life again "That's why I want to get back on my medicine; it helps me control my mood and I don't act out.  It keeps me from using drugs."  Reports that he did use ICE yesterday "but don't want to end up doing that again that's why I need my medicine."  States he is able to go to his "moms house if I have my medications.    Reports that has a history of visual hallucination but medications help stop them.  Patient denies suicidal/self-harm/homicidal ideation, psychosis, and paranoia.        During evaluation Thomas [name] is sitting on side of bed; he is alert/oriented x 4; calm/cooperative; and mood congruent with affect.  Patient is speaking in a clear tone at moderate volume, and normal pace; with good eye contact.  His thought process is coherent and relevant; There is no indication that he is currently responding to internal/external stimuli or experiencing delusional thought content.  Patient denies suicidal/self-harm/homicidal ideation, psychosis, and paranoia.  Patient has remained calm throughout assessment and has answered questions appropriately.     For detailed note see TTS tele assessment note  Recommendations:  [facility].  Give prescription  for week worth of medications Wellbutrin XL 300 mg; Buspar 7.5 mg Bid; and Clonidine 0.1 mg Bid  (Patient can have the doses of Buspar and Clonidine adjusted when he has intake appointment at [facility]  Disposition:  Patient is psychiatrically cleared  No evidence of imminent risk to self or others at present.   Patient does not meet criteria for psychiatric inpatient admission. Supportive therapy provided about ongoing stressors. Discussed crisis plan, support from social network, calling 911, coming to the Emergency Department, and calling Suicide Hotline.  Spoke with Burna Forts, PA informed of above recommendation and disposition  Assunta Found, NP

## 2018-10-30 NOTE — BH Assessment (Signed)
Attempted to complete [facility] Assessment; pt unable to stay awake and cannot be understood when he talks. Will attempt at a later time.

## 2018-10-30 NOTE — ED Notes (Signed)
Pt in purple scrubs and wanded by security. °

## 2018-11-01 ENCOUNTER — Emergency Department (HOSPITAL_COMMUNITY)
Admission: EM | Admit: 2018-11-01 | Discharge: 2018-11-02 | Disposition: A | Discharge status: Home / Self Care | Payer: Self-pay | Attending: Emergency Medicine | Admitting: Emergency Medicine

## 2018-11-01 DIAGNOSIS — F314 Bipolar disorder, current episode depressed, severe, without psychotic features: Secondary | ICD-10-CM | POA: Insufficient documentation

## 2018-11-01 DIAGNOSIS — R45851 Suicidal ideations: Secondary | ICD-10-CM | POA: Insufficient documentation

## 2018-11-01 DIAGNOSIS — Z79899 Other long term (current) drug therapy: Secondary | ICD-10-CM | POA: Insufficient documentation

## 2018-11-01 DIAGNOSIS — F909 Attention-deficit hyperactivity disorder, unspecified type: Secondary | ICD-10-CM | POA: Insufficient documentation

## 2018-11-01 DIAGNOSIS — F1520 Other stimulant dependence, uncomplicated: Secondary | ICD-10-CM | POA: Insufficient documentation

## 2018-11-01 DIAGNOSIS — F17210 Nicotine dependence, cigarettes, uncomplicated: Secondary | ICD-10-CM | POA: Insufficient documentation

## 2018-11-01 LAB — COMPREHENSIVE METABOLIC PANEL
ALT: 39 U/L (ref 0–44)
AST: 43 U/L — ABNORMAL HIGH (ref 15–41)
Albumin: 4 g/dL (ref 3.5–5.0)
Alkaline Phosphatase: 45 U/L (ref 38–126)
Anion gap: 11 (ref 5–15)
BUN: 7 mg/dL (ref 6–20)
CO2: 23 mmol/L (ref 22–32)
Calcium: 9.2 mg/dL (ref 8.9–10.3)
Chloride: 106 mmol/L (ref 98–111)
Creatinine, Ser: 0.89 mg/dL (ref 0.61–1.24)
GFR calc Af Amer: >60 mL/min (ref >60)
GFR calc non Af Amer: >60 mL/min (ref >60)
GLUCOSE: 84 mg/dL (ref 70–99)
Potassium: 3.5 mmol/L (ref 3.5–5.1)
Sodium: 140 mmol/L (ref 135–145)
Total Bilirubin: 0.4 mg/dL (ref 0.3–1.2)
Total Protein: 6.8 g/dL (ref 6.5–8.1)

## 2018-11-01 LAB — ETHANOL: Alcohol, Ethyl (B): <10 mg/dL (ref <10)

## 2018-11-01 LAB — RAPID URINE DRUG SCREEN, HOSP PERFORMED
Amphetamines: NOT DETECTED
Barbiturates: NOT DETECTED
Benzodiazepines: NOT DETECTED
COCAINE: NOT DETECTED
OPIATES: NOT DETECTED
Tetrahydrocannabinol: NOT DETECTED

## 2018-11-01 LAB — CBC
HCT: 40.7 % (ref 39.0–52.0)
Hemoglobin: 14 g/dL (ref 13.0–17.0)
MCH: 31.3 pg (ref 26.0–34.0)
MCHC: 34.4 g/dL (ref 30.0–36.0)
MCV: 91.1 fL (ref 80.0–100.0)
Platelets: 187 10*3/uL (ref 150–400)
RBC: 4.47 MIL/uL (ref 4.22–5.81)
RDW: 13.2 % (ref 11.5–15.5)
WBC: 8.3 10*3/uL (ref 4.0–10.5)
nRBC: 0 % (ref 0.0–0.2)

## 2018-11-01 LAB — ACETAMINOPHEN LEVEL

## 2018-11-01 LAB — SALICYLATE LEVEL: Salicylate Lvl: <7 mg/dL (ref 2.8–30.0)

## 2018-11-01 MED ORDER — BUPROPION HCL ER (XL) 150 MG PO TB24
300.0000 mg | ORAL_TABLET | Freq: Every day | ORAL | Status: DC
  Administered 2018-11-02: 300 mg via ORAL
  Filled 2018-11-01: qty 2

## 2018-11-01 NOTE — ED Notes (Signed)
Belongings in locker #2. 3 Bags. Pt wanded in triage.
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
## 2018-11-02 NOTE — Discharge Instructions (Signed)
Follow up with mental health provider - see resources provided.   Return to ER for new or worsening symptoms, any additional concerns.

## 2018-11-02 NOTE — ED Notes (Signed)
Pt left without discharge instructions.

## 2018-11-02 NOTE — ED Notes (Signed)
TTS in progress 

## 2018-11-02 NOTE — Progress Notes (Signed)
Patient is seen by me via tele-psych and I have consulted with Dr. Lucianne Muss.  Patient denies any suicidal homicidal ideations and denies any hallucinations.  Patient reports that he continues to abuse substances.  He is hoping to get off of drugs so that he can get a job and get insurance and seek improved treatment.  Patient states that his mom will assist him if she knows that he is trying to get help.  Patient states he does not feel that he needs to be in the hospital any longer and that he agrees to follow-up at [facility] to get assistance with getting an ID and will follow up with [facility] for therapy and possible psychiatric treatment.  At this time patient does not meet inpatient criteria and is psychiatrically cleared.  I attempted to contact Dr. Madilyn Hook but she was unavailable at this time.

## 2018-11-02 NOTE — ED Notes (Signed)
Breakfast tray ordered 

## 2018-11-02 NOTE — BH Assessment (Signed)
[facility] Assessment Progress Note   Patient reassessed on 11/02/2018:  Patient presented orientated x4, mood "alright", affect groggy from just waking up, flat, .  Patient denied current SI, HI, AVH.  He reports having an appetite and wanting to eat breakfast this morning.  Patient reports feeling "rested" this morning from last night sleep.  He reports not being disrupted by auditory or visual hallucinations, which is why he received restful sleep last night.  The Patient  also reports feeling "no one cares" in reference to immediate family members and friends.  Per Reola Calkins, NP;  Patient no longer meets inpatient criteria.

## 2018-11-03 ENCOUNTER — Emergency Department (HOSPITAL_COMMUNITY)
Admission: EM | Admit: 2018-11-03 | Discharge: 2018-11-04 | Disposition: A | Discharge status: Home / Self Care | Payer: Self-pay | Attending: Emergency Medicine | Admitting: Emergency Medicine

## 2018-11-03 ENCOUNTER — Encounter (HOSPITAL_COMMUNITY): Payer: Self-pay

## 2018-11-03 ENCOUNTER — Other Ambulatory Visit: Payer: Self-pay

## 2018-11-03 DIAGNOSIS — Z5321 Procedure and treatment not carried out due to patient leaving prior to being seen by health care provider: Secondary | ICD-10-CM | POA: Insufficient documentation

## 2018-11-03 DIAGNOSIS — R451 Restlessness and agitation: Secondary | ICD-10-CM | POA: Insufficient documentation
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
## 2018-11-03 NOTE — ED Notes (Signed)
Pt. Told registration, "Hi, see ya later!"

## 2018-11-03 NOTE — ED Notes (Signed)
Attempted lab draw but unsuccessful. RN,Brian made aware.

## 2018-11-03 NOTE — ED Notes (Signed)
Pt verbally aggressive.

## 2018-11-03 NOTE — ED Triage Notes (Signed)
Pt denies wanting to hurt self or others at this time.

## 2018-11-04 ENCOUNTER — Emergency Department (HOSPITAL_COMMUNITY)
Admission: EM | Admit: 2018-11-04 | Discharge: 2018-11-05 | Disposition: A | Discharge status: Home / Self Care | Payer: Self-pay | Attending: Internal Medicine | Admitting: Internal Medicine

## 2018-11-04 ENCOUNTER — Other Ambulatory Visit: Payer: Self-pay

## 2018-11-04 ENCOUNTER — Encounter (HOSPITAL_COMMUNITY): Payer: Self-pay | Admitting: Emergency Medicine

## 2018-11-04 DIAGNOSIS — Z79899 Other long term (current) drug therapy: Secondary | ICD-10-CM | POA: Insufficient documentation

## 2018-11-04 DIAGNOSIS — F17210 Nicotine dependence, cigarettes, uncomplicated: Secondary | ICD-10-CM | POA: Insufficient documentation

## 2018-11-04 DIAGNOSIS — F19959 Other psychoactive substance use, unspecified with psychoactive substance-induced psychotic disorder, unspecified: Secondary | ICD-10-CM | POA: Diagnosis present

## 2018-11-04 DIAGNOSIS — F209 Schizophrenia, unspecified: Secondary | ICD-10-CM | POA: Insufficient documentation

## 2018-11-04 DIAGNOSIS — Z590 Homelessness: Secondary | ICD-10-CM | POA: Insufficient documentation

## 2018-11-04 DIAGNOSIS — F909 Attention-deficit hyperactivity disorder, unspecified type: Secondary | ICD-10-CM | POA: Insufficient documentation

## 2018-11-04 DIAGNOSIS — F17200 Nicotine dependence, unspecified, uncomplicated: Secondary | ICD-10-CM | POA: Diagnosis present

## 2018-11-04 DIAGNOSIS — F419 Anxiety disorder, unspecified: Secondary | ICD-10-CM | POA: Insufficient documentation

## 2018-11-04 DIAGNOSIS — F1220 Cannabis dependence, uncomplicated: Secondary | ICD-10-CM | POA: Insufficient documentation

## 2018-11-04 DIAGNOSIS — R443 Hallucinations, unspecified: Secondary | ICD-10-CM | POA: Insufficient documentation

## 2018-11-04 DIAGNOSIS — F314 Bipolar disorder, current episode depressed, severe, without psychotic features: Secondary | ICD-10-CM | POA: Insufficient documentation

## 2018-11-04 DIAGNOSIS — R45851 Suicidal ideations: Secondary | ICD-10-CM | POA: Insufficient documentation

## 2018-11-04 DIAGNOSIS — F1994 Other psychoactive substance use, unspecified with psychoactive substance-induced mood disorder: Secondary | ICD-10-CM | POA: Diagnosis present

## 2018-11-04 LAB — COMPREHENSIVE METABOLIC PANEL
ALT: 43 U/L (ref 0–44)
AST: 31 U/L (ref 15–41)
Albumin: 4.5 g/dL (ref 3.5–5.0)
Alkaline Phosphatase: 52 U/L (ref 38–126)
Anion gap: 9 (ref 5–15)
BILIRUBIN TOTAL: 0.9 mg/dL (ref 0.3–1.2)
BUN: 9 mg/dL (ref 6–20)
CO2: 25 mmol/L (ref 22–32)
Calcium: 9.2 mg/dL (ref 8.9–10.3)
Chloride: 104 mmol/L (ref 98–111)
Creatinine, Ser: 0.91 mg/dL (ref 0.61–1.24)
GFR calc Af Amer: >60 mL/min (ref >60)
GFR calc non Af Amer: >60 mL/min (ref >60)
Glucose, Bld: 111 mg/dL — ABNORMAL HIGH (ref 70–99)
POTASSIUM: 3.5 mmol/L (ref 3.5–5.1)
Sodium: 138 mmol/L (ref 135–145)
Total Protein: 7.5 g/dL (ref 6.5–8.1)

## 2018-11-04 LAB — SALICYLATE LEVEL: Salicylate Lvl: <7 mg/dL (ref 2.8–30.0)

## 2018-11-04 LAB — RAPID URINE DRUG SCREEN, HOSP PERFORMED
Amphetamines: NOT DETECTED
Barbiturates: NOT DETECTED
Benzodiazepines: NOT DETECTED
Cocaine: NOT DETECTED
Opiates: NOT DETECTED
Tetrahydrocannabinol: NOT DETECTED

## 2018-11-04 LAB — CBC
HCT: 42.2 % (ref 39.0–52.0)
Hemoglobin: 14.3 g/dL (ref 13.0–17.0)
MCH: 30.6 pg (ref 26.0–34.0)
MCHC: 33.9 g/dL (ref 30.0–36.0)
MCV: 90.2 fL (ref 80.0–100.0)
Platelets: 225 10*3/uL (ref 150–400)
RBC: 4.68 MIL/uL (ref 4.22–5.81)
RDW: 13.7 % (ref 11.5–15.5)
WBC: 6.9 10*3/uL (ref 4.0–10.5)
nRBC: 0 % (ref 0.0–0.2)

## 2018-11-04 LAB — ACETAMINOPHEN LEVEL: Acetaminophen (Tylenol), Serum: <10 ug/mL — ABNORMAL LOW (ref 10–30)

## 2018-11-04 LAB — ETHANOL: Alcohol, Ethyl (B): <10 mg/dL (ref <10)

## 2018-11-04 MED ORDER — GABAPENTIN 300 MG PO CAPS
300.0000 mg | ORAL_CAPSULE | Freq: Three times a day (TID) | ORAL | Status: DC
  Administered 2018-11-04 – 2018-11-05 (×2): 300 mg via ORAL
  Filled 2018-11-04 (×2): qty 1

## 2018-11-04 MED ORDER — HALOPERIDOL 5 MG PO TABS
5.0000 mg | ORAL_TABLET | Freq: Two times a day (BID) | ORAL | Status: DC
  Administered 2018-11-05: 5 mg via ORAL
  Filled 2018-11-04: qty 1

## 2018-11-04 NOTE — ED Notes (Signed)
Unable to located pt in lobby/restroom/or outside of the Emergency Room.

## 2018-11-04 NOTE — ED Triage Notes (Signed)
Per [name] called out related to pt shoplifting a birthday card from [facility]; pt referring to self as God and walking out into traffic.

## 2018-11-04 NOTE — ED Notes (Signed)
Pt called for room. Not in lobby at this time

## 2018-11-04 NOTE — ED Notes (Signed)
Pt wanded and changed into purple paper scrubs.

## 2018-11-04 NOTE — BH Assessment (Signed)
[facility] Assessment Progress Note  Case was staffed with Lord DNP who recommended patient be observed and monitored for safety.     

## 2018-11-04 NOTE — ED Notes (Signed)
Unable to locate pt in lobby/restroom/or outside of Emergency Room.

## 2018-11-04 NOTE — ED Notes (Signed)
Pt refused to wake up and take medication.

## 2018-11-04 NOTE — ED Notes (Signed)
Bed: [facility] Expected date:  Expected time:  Means of arrival:  Comments: 

## 2018-11-04 NOTE — ED Notes (Signed)
Pt was sitting on the floor in the nurse's station. He yelled out once and then slid down onto the floor with a fixed stare and drooling. He became alert when touched, was taken to his room where VS were taken and pt allowed blood draw. Asked what he needs and he whined, "love". Vacillates between passing out and crying. Urine collect cup given to pt.
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
## 2018-11-04 NOTE — ED Notes (Signed)
Pt alert and having anger outburst for no apparent reason. Then calm s down and apologizes.

## 2018-11-04 NOTE — ED Notes (Signed)
Pt sleeping in bed at this time. Will attempt to assess pt at a later time.
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
## 2018-11-05 MED ORDER — GABAPENTIN 300 MG PO CAPS: 300.0000 mg | ORAL_CAPSULE | Freq: Three times a day (TID) | ORAL | 0 refills | Status: AC

## 2018-11-05 MED ORDER — HALOPERIDOL 5 MG PO TABS: 5.0000 mg | ORAL_TABLET | Freq: Two times a day (BID) | ORAL | 0 refills | Status: AC

## 2018-11-05 NOTE — ED Notes (Signed)
Pt refused to wake up and answer nurse questions.

## 2018-11-05 NOTE — ED Notes (Signed)
Pt d/c home per MD order. Discharge summary reviewed with pt. Pt verbalizes understanding. Denies SI/HI/AVH. RX provided. Bus pass provided per pt request. Pt signed e-signature.  Personal property returned. Ambulatory off unit.

## 2018-11-05 NOTE — Discharge Instructions (Signed)
For your behavioral health needs, you are advised to follow up with an outpatient provider.  You recently had an intake appointment scheduled with [facility] for ACT Team services.  Contact them at your earliest opportunity to see if you are eligible for services with them:       [facility]      [facility], [address]      [address]      [place], [place]  [address]      [phone]  As an alternative, contact [facility].  New and returning patients are seen at their walk-in clinic.  Walk-in hours are Monday - Friday from 8:00 am - 3:00 pm.  Walk-in patients are seen on a first come, first served basis.  Try to arrive as early as possible for the best chance of being seen the same day:       [facility]      [address]      [place], [place] [address]      [phone]

## 2018-11-05 NOTE — ED Notes (Signed)
Pt taking a shower 
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
## 2018-11-05 NOTE — BH Assessment (Signed)
[facility] Assessment Progress Note  Per [name], DO, this pt does not require psychiatric hospitalization at this time.  Pt is to be discharged from [facility].  Pt's recent EPIC record indicates that he had an intake appointment with [facility] scheduled for Friday, 11/01/2018 for ACT Team services.  Discharge instructions advise pt to follow up with [facility], offering contact information for [facility] as an alternative.  Pt's nurse, Morrie Sheldon, has been notified.  Doylene Canning, MA Triage Specialist [phone]

## 2018-11-05 NOTE — ED Notes (Signed)
Pt alert and cooperative. Pt denies any si, hi or avh at this time. Pt at nurse station explaining to the nurse how his life will change in a positive way. Pt safe, will continue to monitor.

## 2018-11-06 ENCOUNTER — Emergency Department (HOSPITAL_COMMUNITY)
Admission: EM | Admit: 2018-11-06 | Discharge: 2018-11-06 | Disposition: A | Discharge status: Home / Self Care | Payer: Self-pay | Attending: Emergency Medicine | Admitting: Emergency Medicine

## 2018-11-06 ENCOUNTER — Other Ambulatory Visit: Payer: Self-pay

## 2018-11-06 ENCOUNTER — Encounter (HOSPITAL_COMMUNITY): Payer: Self-pay | Admitting: *Deleted

## 2018-11-06 DIAGNOSIS — Z5900 Homelessness unspecified: Secondary | ICD-10-CM

## 2018-11-06 DIAGNOSIS — F1220 Cannabis dependence, uncomplicated: Secondary | ICD-10-CM | POA: Insufficient documentation

## 2018-11-06 DIAGNOSIS — F909 Attention-deficit hyperactivity disorder, unspecified type: Secondary | ICD-10-CM | POA: Insufficient documentation

## 2018-11-06 DIAGNOSIS — F1910 Other psychoactive substance abuse, uncomplicated: Secondary | ICD-10-CM | POA: Insufficient documentation

## 2018-11-06 DIAGNOSIS — F319 Bipolar disorder, unspecified: Secondary | ICD-10-CM | POA: Insufficient documentation

## 2018-11-06 DIAGNOSIS — F209 Schizophrenia, unspecified: Secondary | ICD-10-CM | POA: Insufficient documentation

## 2018-11-06 DIAGNOSIS — F419 Anxiety disorder, unspecified: Secondary | ICD-10-CM | POA: Insufficient documentation

## 2018-11-06 DIAGNOSIS — F17210 Nicotine dependence, cigarettes, uncomplicated: Secondary | ICD-10-CM | POA: Insufficient documentation

## 2018-11-06 NOTE — ED Notes (Signed)
Pt refusing to leave.  Security called to [facility] for assistance.

## 2018-11-06 NOTE — ED Notes (Signed)
Bed: [facility] Expected date:  Expected time:  Means of arrival:  Comments: 

## 2018-11-06 NOTE — ED Notes (Signed)
Pt sleeping. 

## 2018-11-06 NOTE — ED Triage Notes (Signed)
Pt stated "I was @ the [facility] and heard voices.  One guy said I do [employer].  I saw Lucifer today.  I think I'm Lucifer.  I haven't done drugs in 2 days.  I can't go see my mother until I don't do drugs.  I'm still seeing stuff."
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
## 2018-11-06 NOTE — Discharge Instructions (Signed)
You are advised to follow-up with the psychotherapeutic services for further help, please make sure you see them for optimal care.

## 2018-11-06 NOTE — ED Notes (Signed)
Security & Off Duty escorted pt from room.

## 2019-02-02 IMAGING — CR DG CHEST 2V
2 series · 2 of 2 positions shown · non-contrast
Comparison: December 13, 2017

CLINICAL DATA: Redness and swelling

EXAM:
CHEST - 2 VIEW

[x chest ap]
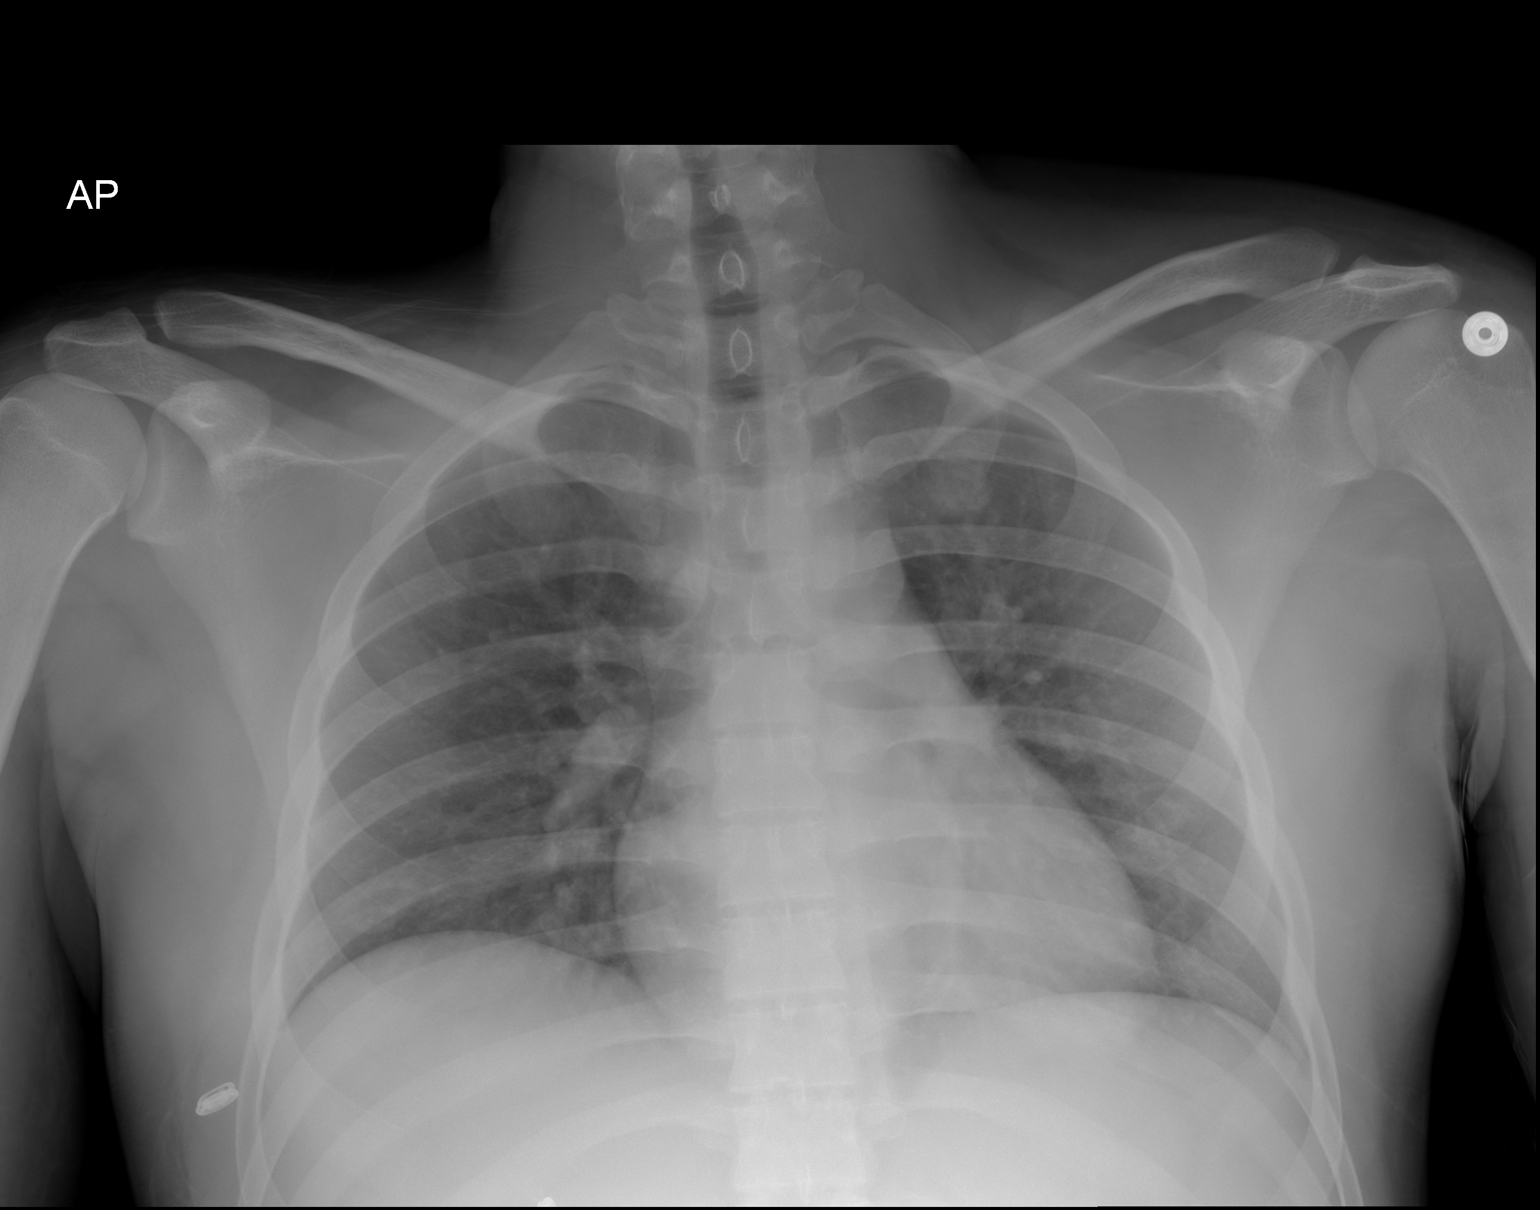

[w chest lat]
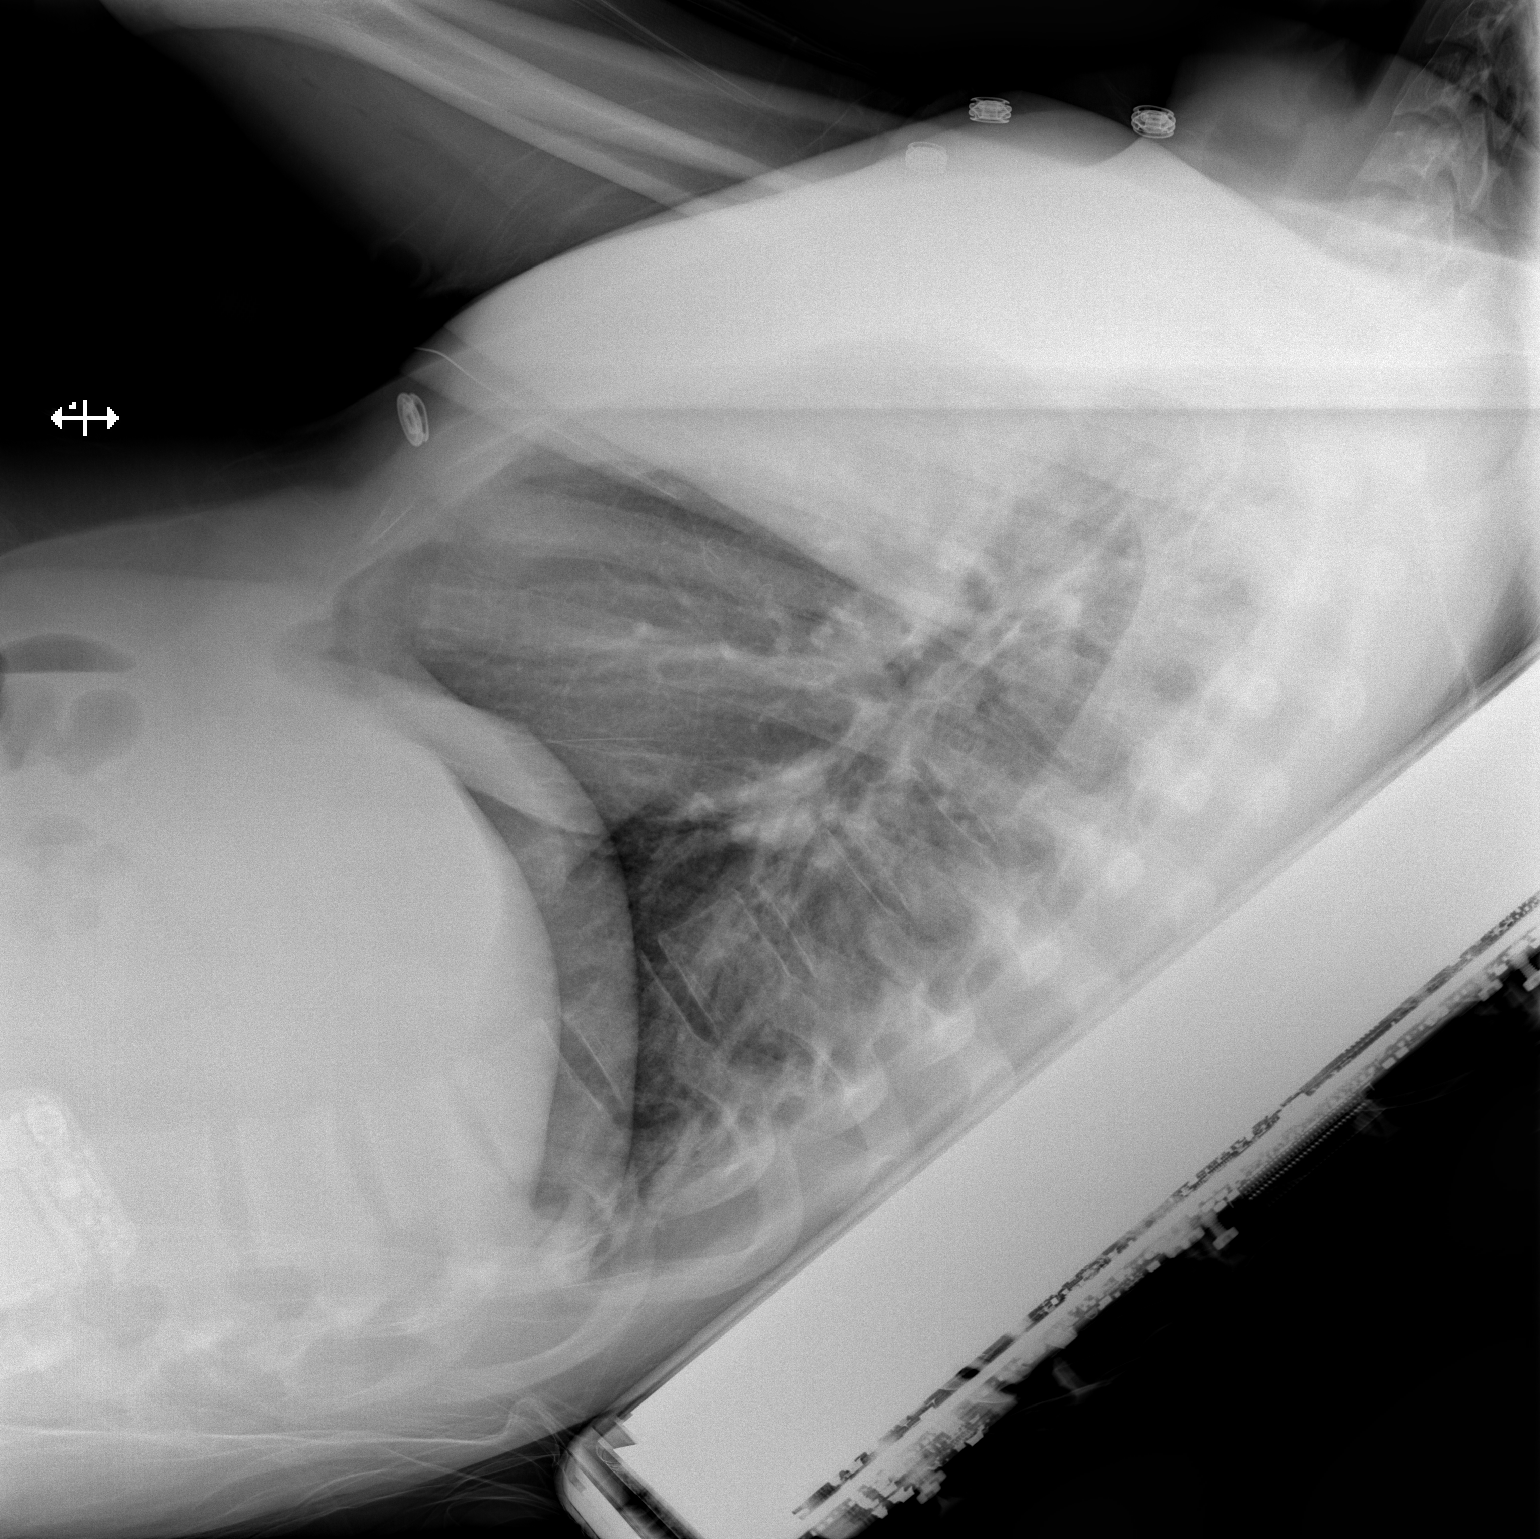

[2 of 2 positions shown; findings below may reference images not displayed]

FINDINGS: The heart size and mediastinal contours are within normal limits.
Both lungs are clear. The visualized skeletal structures are
unremarkable.
IMPRESSION: No active cardiopulmonary disease.

## 2019-03-03 IMAGING — DX DG CHEST 1V PORT
1 series · 1 of 1 positions shown · non-contrast
Comparison: 02/03/2018

CLINICAL DATA: Fever.

EXAM:
PORTABLE CHEST 1 VIEW

[chest ap]
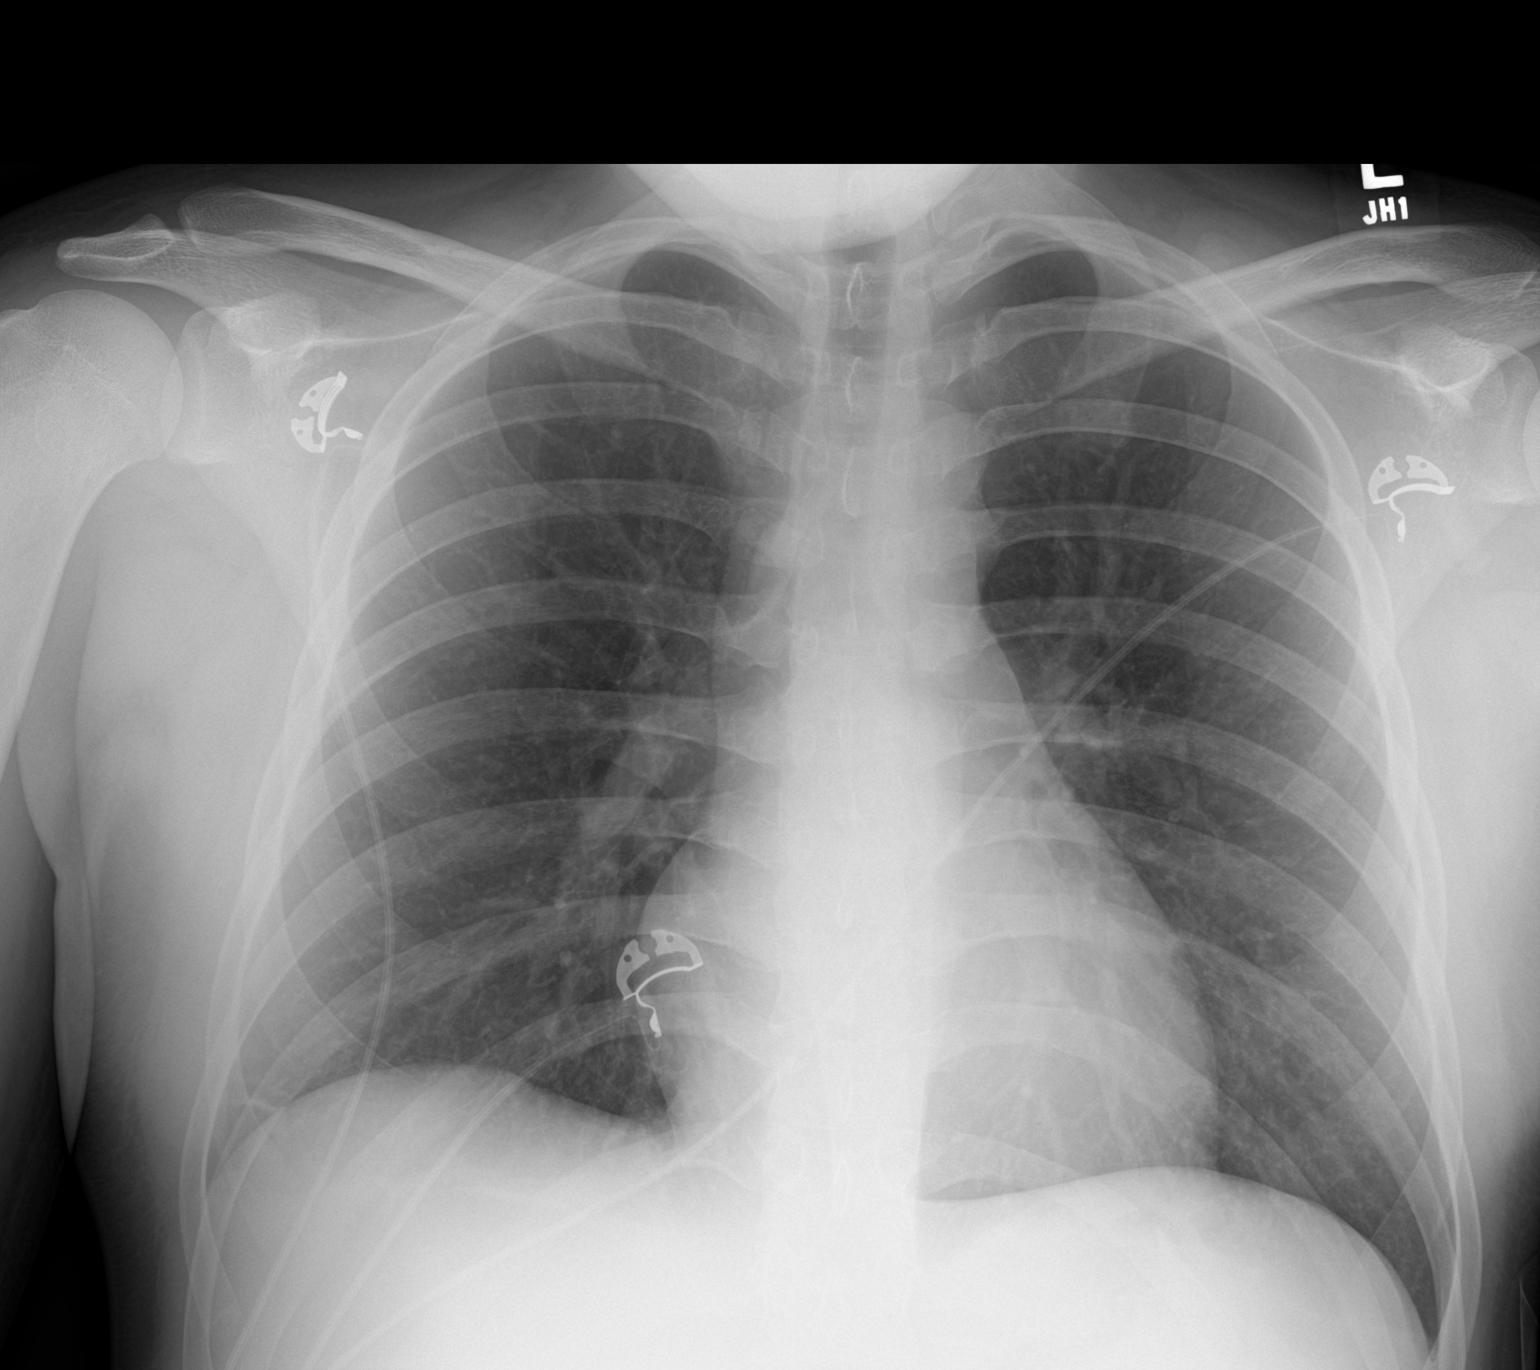

[1 of 1 positions shown; findings below may reference images not displayed]

FINDINGS: The heart size and mediastinal contours are within normal limits.
Both lungs are clear except for minimal linear atelectasis at the
right lung base. The visualized skeletal structures are
unremarkable.
IMPRESSION: Minimal atelectasis at the right lung base.  Otherwise, normal exam.
